# Patient Record
Sex: Male | Born: 1935 | Race: White | Hispanic: No | Smoking: Former smoker
Health system: Southern US, Community
[De-identification: ages and names within clinical notes are randomized; demographics above are authoritative.]

## PROBLEM LIST (undated history)

## (undated) DIAGNOSIS — M1990 Unspecified osteoarthritis, unspecified site: Secondary | ICD-10-CM

## (undated) DIAGNOSIS — J309 Allergic rhinitis, unspecified: Secondary | ICD-10-CM

## (undated) DIAGNOSIS — E785 Hyperlipidemia, unspecified: Secondary | ICD-10-CM

## (undated) DIAGNOSIS — I714 Abdominal aortic aneurysm, without rupture, unspecified: Secondary | ICD-10-CM

## (undated) DIAGNOSIS — N182 Chronic kidney disease, stage 2 (mild): Secondary | ICD-10-CM

## (undated) DIAGNOSIS — E119 Type 2 diabetes mellitus without complications: Secondary | ICD-10-CM

## (undated) DIAGNOSIS — I6522 Occlusion and stenosis of left carotid artery: Secondary | ICD-10-CM

## (undated) DIAGNOSIS — I10 Essential (primary) hypertension: Secondary | ICD-10-CM

## (undated) DIAGNOSIS — N529 Male erectile dysfunction, unspecified: Secondary | ICD-10-CM

## (undated) DIAGNOSIS — R413 Other amnesia: Secondary | ICD-10-CM

## (undated) DIAGNOSIS — K219 Gastro-esophageal reflux disease without esophagitis: Secondary | ICD-10-CM

## (undated) DIAGNOSIS — Z720 Tobacco use: Secondary | ICD-10-CM

## (undated) DIAGNOSIS — I2510 Atherosclerotic heart disease of native coronary artery without angina pectoris: Secondary | ICD-10-CM

## (undated) DIAGNOSIS — I350 Nonrheumatic aortic (valve) stenosis: Secondary | ICD-10-CM

## (undated) DIAGNOSIS — I499 Cardiac arrhythmia, unspecified: Secondary | ICD-10-CM

## (undated) DIAGNOSIS — IMO0001 Reserved for inherently not codable concepts without codable children: Secondary | ICD-10-CM

## (undated) HISTORY — DX: Male erectile dysfunction, unspecified: N52.9

## (undated) HISTORY — DX: Allergic rhinitis, unspecified: J30.9

## (undated) HISTORY — DX: Other amnesia: R41.3

## (undated) HISTORY — DX: Type 2 diabetes mellitus without complications: E11.9

## (undated) HISTORY — DX: Gastro-esophageal reflux disease without esophagitis: K21.9

## (undated) HISTORY — PX: ELBOW SURGERY: SHX618

## (undated) HISTORY — DX: Hyperlipidemia, unspecified: E78.5

## (undated) HISTORY — DX: Cardiac arrhythmia, unspecified: I49.9

## (undated) HISTORY — DX: Reserved for inherently not codable concepts without codable children: IMO0001

## (undated) HISTORY — DX: Chronic kidney disease, stage 2 (mild): N18.2

## (undated) HISTORY — PX: CORONARY ARTERY BYPASS GRAFT: SHX141

## (undated) HISTORY — DX: Tobacco use: Z72.0

## (undated) HISTORY — DX: Nonrheumatic aortic (valve) stenosis: I35.0

## (undated) HISTORY — PX: APPENDECTOMY: SHX54

## (undated) HISTORY — DX: Occlusion and stenosis of left carotid artery: I65.22

## (undated) HISTORY — DX: Unspecified osteoarthritis, unspecified site: M19.90

## (undated) HISTORY — DX: Essential (primary) hypertension: I10

## (undated) HISTORY — DX: Atherosclerotic heart disease of native coronary artery without angina pectoris: I25.10

---

## 1998-10-26 ENCOUNTER — Ambulatory Visit (HOSPITAL_COMMUNITY): Admission: RE | Admit: 1998-10-26 | Discharge: 1998-10-26 | Payer: Self-pay | Admitting: Gastroenterology

## 1998-10-26 ENCOUNTER — Encounter: Payer: Self-pay | Admitting: Gastroenterology

## 2004-01-08 ENCOUNTER — Inpatient Hospital Stay (HOSPITAL_COMMUNITY): Admission: AD | Admit: 2004-01-08 | Discharge: 2004-01-18 | Payer: Self-pay | Admitting: Emergency Medicine

## 2004-02-15 ENCOUNTER — Encounter (HOSPITAL_COMMUNITY): Admission: RE | Admit: 2004-02-15 | Discharge: 2004-05-15 | Payer: Self-pay | Admitting: Interventional Cardiology

## 2004-05-16 ENCOUNTER — Encounter (HOSPITAL_COMMUNITY): Admission: RE | Admit: 2004-05-16 | Discharge: 2004-06-06 | Payer: Self-pay | Admitting: Interventional Cardiology

## 2004-08-04 ENCOUNTER — Ambulatory Visit (HOSPITAL_COMMUNITY): Admission: RE | Admit: 2004-08-04 | Discharge: 2004-08-04 | Payer: Self-pay | Admitting: Gastroenterology

## 2004-08-17 ENCOUNTER — Ambulatory Visit (HOSPITAL_COMMUNITY): Admission: RE | Admit: 2004-08-17 | Discharge: 2004-08-17 | Payer: Self-pay | Admitting: Gastroenterology

## 2004-12-22 ENCOUNTER — Emergency Department (HOSPITAL_COMMUNITY): Admission: EM | Admit: 2004-12-22 | Discharge: 2004-12-23 | Payer: Self-pay | Admitting: Emergency Medicine

## 2005-04-20 ENCOUNTER — Ambulatory Visit (HOSPITAL_COMMUNITY): Admission: RE | Admit: 2005-04-20 | Discharge: 2005-04-20 | Payer: Self-pay | Admitting: Interventional Cardiology

## 2005-09-20 ENCOUNTER — Emergency Department (HOSPITAL_COMMUNITY): Admission: EM | Admit: 2005-09-20 | Discharge: 2005-09-20 | Payer: Self-pay | Admitting: *Deleted

## 2007-11-06 ENCOUNTER — Inpatient Hospital Stay (HOSPITAL_COMMUNITY): Admission: EM | Admit: 2007-11-06 | Discharge: 2007-11-07 | Payer: Self-pay | Admitting: Emergency Medicine

## 2008-04-17 ENCOUNTER — Ambulatory Visit: Payer: Self-pay | Admitting: Gastroenterology

## 2008-04-30 ENCOUNTER — Ambulatory Visit: Payer: Self-pay | Admitting: Gastroenterology

## 2010-02-16 ENCOUNTER — Ambulatory Visit (HOSPITAL_COMMUNITY): Admission: RE | Admit: 2010-02-16 | Discharge: 2010-02-17 | Payer: Self-pay | Admitting: Neurosurgery

## 2010-03-15 ENCOUNTER — Encounter (INDEPENDENT_AMBULATORY_CARE_PROVIDER_SITE_OTHER): Payer: Self-pay | Admitting: Neurosurgery

## 2010-03-15 ENCOUNTER — Inpatient Hospital Stay (HOSPITAL_COMMUNITY): Admission: RE | Admit: 2010-03-15 | Discharge: 2010-03-16 | Payer: Self-pay | Admitting: Neurosurgery

## 2010-11-16 ENCOUNTER — Encounter: Admission: RE | Admit: 2010-11-16 | Discharge: 2010-11-16 | Payer: Self-pay | Admitting: Orthopedic Surgery

## 2011-02-17 ENCOUNTER — Other Ambulatory Visit: Payer: Self-pay | Admitting: Dermatology

## 2011-03-12 LAB — CBC
HCT: 43.8 % (ref 39.0–52.0)
Hemoglobin: 15 g/dL (ref 13.0–17.0)
Hemoglobin: 15.1 g/dL (ref 13.0–17.0)
MCHC: 34.3 g/dL (ref 30.0–36.0)
MCHC: 34.6 g/dL (ref 30.0–36.0)
MCV: 94 fL (ref 78.0–100.0)
MCV: 94.5 fL (ref 78.0–100.0)
RBC: 4.64 MIL/uL (ref 4.22–5.81)
RDW: 12.9 % (ref 11.5–15.5)
RDW: 13 % (ref 11.5–15.5)

## 2011-03-12 LAB — SURGICAL PCR SCREEN: Staphylococcus aureus: NEGATIVE

## 2011-03-12 LAB — BASIC METABOLIC PANEL
BUN: 9 mg/dL (ref 6–23)
CO2: 29 mEq/L (ref 19–32)
CO2: 31 mEq/L (ref 19–32)
Chloride: 103 mEq/L (ref 96–112)
Chloride: 104 mEq/L (ref 96–112)
Creatinine, Ser: 1.03 mg/dL (ref 0.4–1.5)
GFR calc Af Amer: >60 mL/min (ref >60)
GFR calc non Af Amer: >60 mL/min (ref >60)
Glucose, Bld: 114 mg/dL — ABNORMAL HIGH (ref 70–99)
Glucose, Bld: 175 mg/dL — ABNORMAL HIGH (ref 70–99)
Potassium: 4.1 mEq/L (ref 3.5–5.1)
Sodium: 137 mEq/L (ref 135–145)

## 2011-05-05 NOTE — Discharge Summary (Signed)
NAME:  Cory Bradley, AULL NO.:  [id number]   MEDICAL RECORD NO.:  [id number]                   PATIENT TYPE:  INP   LOCATION:  2011                                 FACILITY:  [facility]   PHYSICIAN:  Kerin Perna, M.D.               DATE OF BIRTH:  1933/09/22   DATE OF ADMISSION:  01/07/2004  DATE OF DISCHARGE:  01/17/2004                                 DISCHARGE SUMMARY   ADMIT DIAGNOSES:  Chest pain and acute myocardial infarction.   PAST MEDICAL HISTORY:  1. Gastroesophageal reflux disease with esophageal stricture.  2. Migraines.  3. Hyperlipidemia.  4. Hypertension.   ALLERGIES:  PENICILLIN which causes edema.   DISCHARGE DIAGNOSES:  Three-vessel coronary artery disease, status post  acute inferior myocardial infarction, status post coronary artery bypass  grafting x 4.   BRIEF HISTORY:  This is a 77 year old male, who has no prior history of  coronary artery disease; however, one week prior to admission, the patient  had a 20 minute episode of chest pain following administration of Viagra.  The patient denied any further symptoms until the day prior to admission  when he took Viagra again and approximately 2 hours later, began to  experience chest pain.  The patient presented to the emergency department at  [facility].  An EKG that was performed was consistent with  myocardial infarction.  There was one 2 mm ST elevation in leads II, III,  and aVF.  The patient was complaining of nausea and diaphoresis for  approximately 30 minutes.  The patient was evaluated in the emergency  department by Dr. Katrinka Blazing, who then performed an urgent cardiac cath on  January 08, 2004.  Cardiac cath revealed three-vessel coronary artery  disease.  The patient was admitted to [facility], and Dr. Kathlee Nations  Trigt, of CVTS was consulted regarding surgical revascularization.  After  evaluation of the patient, it was Dr. Zenaida Niece Trigt's opinion that the  patient  should undergo coronary artery bypass grafting.   HOSPITAL COURSE:  The patient presented to the emergency department at [facility] and was admitted on January 08, 2004.  The patient was  evaluated by Dr. Verdis Prime in the emergency department and was diagnosed  with an acute inferior myocardial infarction.  The patient was taken for an  urgent cardiac cath by Dr. Katrinka Blazing which revealed three-vessel coronary artery  disease and an ejection fraction of 50%.  The acute myocardial infarction  related to an acute occlusion of the right coronary artery, there was  successful placement of a PTCA stent in the right coronary artery by Dr.  Katrinka Blazing.  The patient was started on IV Integrilin and aspirin.  Dr. Donata Clay  was consulted, and the patient was scheduled for coronary artery bypass  grafting surgery.  The patient remained free of pain and denied shortness of  breath prior to  surgery.  The patient was noted to have elevated blood  sugars, and therefore a hemoglobin A1c was obtained.  Hemoglobin A1c was 6.3  and therefore, the patient was diagnosed with diabetes mellitus.  The  patient was then started on sliding-scale insulin.  The patient was taken to  the OR on January 13, 2004, for coronary artery bypass grafting x 4.  Left  internal mammary artery was grafted to the LAD, saphenous vein was grafted  to the circumflex, and saphenous vein was grafted to the diagonal and  saphenous vein graft to the PD.  Endoscopic vein harvest was performed on  the left thigh.  The patient tolerated the procedure well and was stable  immediately postoperatively.  The patient was extubated without problem and  woke up from anesthesia neurologically intact.  The patient's postoperative  course progressed as expected.  Chest tubes and invasive labs were  discontinued on postoperative day one without complication.  The patient was  then transferred from the SICU to unit 2000 in stable condition.   The  patient began cardiac rehab on px day one and tolerated this well.  Postoperative day two, the patient was without complaint.  He was afebrile,  and vital signs were stable.  His blood pressure and heart rate were noted  to be mildly elevated at 150/84 and a heart rate of 105.  The patient's O2  saturation was 94% on 2 liters, and he was in normal sinus rhythm.   PHYSICAL EXAMINATION:  CARDIAC:  Slightly tachycardic.  LUNGS:  Faint crackles at bilateral bases.  ABDOMEN:  Soft, nontender, and nondistended.  There were positive bowel  sounds and positive flatus.  However, the patient had not had a bowel  movement since surgery.  The incisions were clean, dry, and intact.  EXTREMITIES:  The extremities revealed no edema.   The patient's beta blocker was increased for treatment of the elevated blood  pressure and heart rate.  The oxygen was weaned, and the patient was given a  laxative for constipation.  The patient was felt to be in stable condition  at this time and as long as there are no complications, the patient will be  ready for discharge within the next 1-2 days.  The diabetes coordinator was  requested to come and evaluate the patient on postoperative day three for  recommendations of outpatient diabetes medications and care.   LABORATORY DATA:  CBC on January 15, 2004:  White count 11.4, hemoglobin  10.1, hematocrit 28.6, platelets 215.  BNP on January 15, 2004:  Sodium 136,  potassium 3.7, BUN 21, creatinine 1.1, glucose 135.   CONDITION ON DISCHARGE:  Improved.   INSTRUCTIONS:   MEDICATIONS:  1. The patient is to resume Aciphex 20 mg p.o. daily.  2. New medications include aspirin 325 mg 1p.o. daily.  3. Toprol XL 50 mg 1 p.o. daily.  4. Lipitor 20 mg 1 p.o. daily.  5. Colace 100 mg 2 tabs p.o. daily.  6. Tylox 1-2 p.o. q.4-6h. p.r.n. pain.   ACTIVITY:  No driving, no strenuous activity, and the patient is to continue daily breathing and walking exercises.    DIET:  Low salt, fat, and cholesterol.   WOUND CARE:  1. Shower daily and clean the incisions with soap and water.  2. If the incisions are red, swollen, drain, or if the patient has a fever     of 101 degrees F, he is to call the CVTS office at [phone].  FOLLOW-UP APPOINTMENT:  1. With Dr. Donata Clay on January 29, 2004, at 2 p.m.  2. Dr. Katrinka Blazing within 2 weeks after discharge.  The patient is to call his     office for an appointment.  3. The patient is to see his primary care physician for follow-up of newly     diagnosed diabetes mellitus.      Pecola Leisure, [place]                      Kerin Perna, M.D.    AY/MEDQ  D:  01/15/2004  T:  01/16/2004  Job:  [id number]   cc:   Mikey Bussing, M.D.  [address]  [place]  [place] [address]   Lyn Records III, M.D.  [address]  [address]  [place]  [place] [address]  Fax: [phone]

## 2011-05-05 NOTE — Cardiovascular Report (Signed)
NAME:  TYJON, BOWEN NO.:  [id number]   MEDICAL RECORD NO.:  [id number]                   PATIENT TYPE:  INP   LOCATION:  2929                                 FACILITY:  [facility]   PHYSICIAN:  Lesleigh Noe, M.D.            DATE OF BIRTH:  [birth year]   DATE OF PROCEDURE:  01/08/2004  DATE OF DISCHARGE:                              CARDIAC CATHETERIZATION   INDICATIONS FOR PROCEDURE:  Acute inferior myocardial infarction.   DATE OF PROCEDURE:  January 08, 2004.   PROCEDURE PERFORMED:  1. Left heart catheterization.  2. Selective coronary angiography.  3. Left ventriculography.  4. Percutaneous transluminal coronary angioplasty of right coronary.   DESCRIPTION:  After informed consent, a 6-French sheath was placed in the  right femoral artery using modified Seldinger technique.  A 6-French A2  multipurpose catheter was used for hemodynamic recordings, left  ventriculography by hand injection, and selective left and right  coronary  angiography.  The patient came to the cath lab on IV fluids.  Had received a  bolus of heparin and also received aspirin. The emergency physician started  nitroglycerin.  However, I stopped this because the patient had recently  used Viagra within the past three hours.   After review of the scintiangiograms, it was felt that angioplasty of the  right coronary was necessary to minimize inferior wall damage, but we  decided against stenting the right coronary because the patient had  multivessel disease and would need coronary bypass surgery.   We used a 6 [place] side hole #4 Judkins catheter to obtain guiding shots of  the right coronary and Asahi medium wire to obtain access across the total  occlusion in the mid right coronary.  The patient was started on an  integrilin double bolus followed by an infusion before intervention begun.  ACT was documented to be 224 seconds before wire intervention and an  additional  1000 units of IV heparin was administered.  Ultimately, in mid  case the ACT was 272 seconds.  After crossing the lesion with the wire, we  used a 2.5 x 15-mm Maverick balloon for mid vessel and proximal vessel  angioplasty and then upgraded to a 3.0 x 15 balloon for mid and proximal  vessel angioplasty with resolution of total occlusion and resumption of TIMI-  3 flow and significant reduction in chest discomfort.  ST segments resolved  after the wire crossed the lesion in the mid right coronary.  Angio-Seal was  used to close the right femoral artery arteriotomy site.  We had also placed  a venous sheath 6 French in case pacing was necessary, but this never became  an issue.   RESULTS:   I. HEMODYNAMIC DATA:  A.  Aortic pressure 138/82.  B.  Left ventricular pressure 141/27.   II. LEFT VENTRICULOGRAPHY:  The left ventricle demonstrates moderate  inferior wall hypokinesis.  EF 50%.  No MR.  III. CORONARY ANGIOGRAPHY:  A.  Left main coronary:  Patent, but with distal  40-50% narrowing.  B.  Left anterior descending coronary:  The LAD is a large vessel that wraps  around the left ventricular apex starting at its ostium and extending into  the mid vessel.  There is between 60 and 75% narrowing in multiple spots.  There is also what appears to be intimal disruption in the proximal vessel.  The LAD wraps around the left ventricular apex.  The LAD in its mid and  distal portion is graftable.  C.  Ramus branch:  The ramus branch is small to moderate in size and  contains proximal 70-80% narrowing.  D.  Circumflex artery:  There is an ostial 75-85% circumflex narrowing and a  95% mid circumflex narrowing, followed by large obtuse marginal branch.  The  distal circumflex is occluded.  E.  Right coronary:  The right coronary initially revealed a 95% proximal  stenosis and total occlusion in the mid vessel at the acute marginal branch.  There were no collaterals.   IV. PERCUTANEOUS  CORONARY INTERVENTION:  Following angioplasty approximately  in the mid RCA, the proximal stenosis was reduced to 40% and the mid  stenosis reduced to 40% with resumption of TIMI-3 flow.   CONCLUSIONS:  1. Acute inferior infarction due to occlusion of the mid right coronary.  2. Successful percutaneous transluminal coronary angioplasty of the proximal     and mid right coronary from 95 and 100% to 40 and 40% respectively with     TIMI-3 flow.  3. Severe three-vessel coronary disease with residual 60-80% distal right     coronary disease beyond the angioplasty sites, 60-80% proximal mid left     anterior descending and 70 and 95% stenosis in the circumflex.  4. Mild left ventricular dysfunction with inferior hypokinesis.   PLAN:  Will continue IV integrilin.  Will get CVTS consultation and  hopefully have surgery within the next several days.  Will continue beta  blocker therapy.  Will withhold nitrates because of recent use of  sildenafil.                                               Lesleigh Noe, M.D.    HWS/MEDQ  D:  01/08/2004  T:  01/08/2004  Job:  [id number]   cc:   Thelma Barge P. Modesto Charon, M.D.  [address]  [place]  [place] [address]  Fax: [phone]   CVTS

## 2011-05-05 NOTE — Op Note (Signed)
NAME:  Cory Bradley, Cory Bradley NO.:  [id number]   MEDICAL RECORD NO.:  [id number]                   PATIENT TYPE:  INP   LOCATION:  2301                                 FACILITY:  [facility]   PHYSICIAN:  Kerin Perna III, M.D.           DATE OF BIRTH:  Nov 25, 1933   DATE OF PROCEDURE:  01/13/2004  DATE OF DISCHARGE:                                 OPERATIVE REPORT   PREOPERATIVE DIAGNOSIS:  Class 4 postinfarction unstable angina with acute  diaphragmatic myocardial infarction.   POSTOPERATIVE DIAGNOSIS:  Class 4 postinfarction unstable angina with acute  diaphragmatic myocardial infarction.   OPERATION PERFORMED:  Coronary artery bypass grafting times four (left  internal mammary artery to left anterior descending, saphenous vein graft to  first diagonal, saphenous vein graft to circumflex marginal, saphenous vein  graft to posterior descending).   SURGEON:  Kerin Perna, M.D.   ASSISTANT:  Rowe Clack, P.A.-C.   ANESTHESIA:  General.   INDICATIONS FOR PROCEDURE:  The patient is a 77 year old white male who  presented with acute chest pain and an EKG showing an acute evolving DMI.  Emergency cardiac catheterization by Lyn Records, M.D. demonstrated an  occluded right which was opened with angioplasty.  He also had significant  disease of the LAD, diagonal and circumflex.  He was felt to be a candidate  for surgical revascularization after he recovered from his acute MI.  Prior  to surgery I discussed the patient's cardiac catheterization and his  clinical course with the patient and his wife.  I discussed the indications  and expected benefits of coronary artery bypass surgery for treatment of his  three-vessel coronary artery disease.  I discussed the alternatives to  surgical therapy as well.  We have reviewed the major aspects of the  proposed procedure including the choice of conduit for grafts, the location  of the surgical incisions, the use  of general anesthesia and cardiopulmonary  bypass and the expected postoperative hospital recovery.  We discussed the  risks to him associated with heart bypass surgery including the risks of MI,  CVA, bleeding, wound infection, and death.  He understands the implications  for the surgery and agrees to proceed with the operation as planned under  what I feel is an informed consent.   OPERATIVE FINDINGS:  The vein was harvested from the left thigh and left  lower leg using endoscopic technique.  It was of average quality. The  mammary artery was a good vessel with excellent flow.  His vessels were  small but graftable.  There is a 3 cm hemorrhagic infarct on the  posterolateral left ventricular wall.   DESCRIPTION OF PROCEDURE:  The patient was brought to the operating room and  placed supine on the operating table where general anesthesia was induced  under invasive hemodynamic monitoring.  The chest, abdomen and legs were  prepped with Betadine and draped as  a sterile field.  A sternal incision was  made and the vein was harvested from the left leg using endoscopic  technique. The left internal mammary artery was harvested as a pedicle graft  from its origin at the subclavian vessels.  It was an excellent vessel with  excellent flow.  Heparin was administered and the ACT was documented as  being therapeutic.  Sternal retractor was placed and the pericardial cradle  was created.  Through pursestrings placed in the ascending aorta and right  atrium, the patient was cannulated and placed on bypass.  Coronaries were  inspected.  His right coronary had severe diffuse calcified disease.  Distally, it was fairly small and the bypass graft was placed to the distal  right in an area of minimal plaque.  The circumflex marginal was a small  vessel but adequate target for grafting.  The ventricle was inspected and  there was a recent posterolateral MI with edema and hemorrhagic changes in  the  epicardium.   The mammary artery and vein grafts were prepared for the distal anastomoses.  The cardioplegia cannula was placed for antegrade aortic cardioplegia.  The  patient was cooled to 30 degrees.  The aortic cross-clamp was applied and  750 mL of cold blood cardioplegia was delivered to the root with immediate  cardioplegic arrest, septal temperature dropping less than 14 degrees.  Topical iced saline was used to augment myocardial preservation and a  pericardial insulator pad was used to protect the left phrenic nerve.   The distal coronary anastomoses were then performed.  The first distal  anastomosis was to the distal right.  This was a heavily diseased sclerotic  vessel and a site of anastomosis was determined in the area of least severe  disease.  A reversed saphenous vein was sewn end-to-side with running 7-0  Prolene and there was good flow through the graft.  The second distal  anastomosis was to the first diagonal.  This was a 1.5 mm vessel with  proximal 90% stenosis.  A reversed saphenous vein was sewn end-to-side with  running 7-0 Prolene with good flow through the graft.  The third distal  anastomosis was the OM1.  This was an intramyocardial vessel 1.5 mm in  diameter with a proximal 80% stenosis.  A reversed saphenous vein was sewn  end-to-side with running 7-0 Prolene with good flow through the graft.  Cardioplegia was redosed.  The fourth distal anastomosis was to the distal  LAD.  This also had diffuse disease.  The mammary pedicle was brought  through an opening in the pericardium and was brought down onto the LAD and  sewn end-to-side with running 8-0 Prolene. There was excellent flow through  the anastomosis with immediate rise in septal temperature after released of  the pedicle clamp on the mammary artery.  The mammary pedicle was secured to  the epicardium and the aortic cross-clamp was removed.  The heart resumed a spontaneous rhythm.  Three proximal vein  anastomoses  were placed on the ascending aorta using a partial occlusion clamp. The  partial clamp was removed and the vein grafts were perfused. Each had  excellent flow and hemostasis was documented in the proximal and distal  sites.  The patient was rewarmed to 37 degrees.  Temporary pacing wires were  applied.  The patient was weaned from bypass after the ventilator was  resumed.  No inotropes were required.  Blood pressure and cardiac output  were stable.  A left femoral arterial  line was placed for dampening of the  radial artery.  The mediastinum was irrigated with warm antibiotic  irrigation.  The leg incision was irrigated and closed in a standard  fashion.  The pericardium was loosely reapproximated.  Two mediastinal and a  left pleural chest tube were placed and brought out through separate  incisions. The sternum was closed with interrupted steel wire.  The  pectoralis fascia was closed with a running #1 Vicryl.  The subcutaneous and  skin layers were closed with a running Vicryl.  Total bypass time was 130  minutes with a cross-clamp time of 50 minutes.                                               Mikey Bussing, M.D.    PV/MEDQ  D:  01/13/2004  T:  01/14/2004  Job:  [id number]   cc:   Lesleigh Noe, M.D.  [address]  [address]  [place]  [place] [address]  Fax: [phone]

## 2011-09-26 LAB — POCT CARDIAC MARKERS
CKMB, poc: 1.1
Operator id: 285491
Troponin i, poc: <0.05

## 2011-09-26 LAB — LIPID PANEL
LDL Cholesterol: 37
Triglycerides: 49

## 2011-09-26 LAB — CK TOTAL AND CKMB (NOT AT ARMC)
CK, MB: 1.5
CK, MB: 2.1
Relative Index: 1.7
Total CK: 99

## 2011-09-26 LAB — COMPREHENSIVE METABOLIC PANEL
BUN: 12
Calcium: 8.1 — ABNORMAL LOW
Glucose, Bld: 208 — ABNORMAL HIGH
Total Protein: 5.7 — ABNORMAL LOW

## 2011-09-26 LAB — PROTIME-INR: INR: 1.2

## 2011-09-26 LAB — DIFFERENTIAL
Lymphocytes Relative: 8 — ABNORMAL LOW
Lymphs Abs: 0.9
Monocytes Relative: 4
Neutro Abs: 10 — ABNORMAL HIGH
Neutrophils Relative %: 86 — ABNORMAL HIGH

## 2011-09-26 LAB — CBC
HCT: 37.6 — ABNORMAL LOW
Hemoglobin: 13.1
MCHC: 35
RDW: 13.1

## 2011-09-26 LAB — TSH: TSH: 1.028

## 2011-09-26 LAB — AMYLASE: Amylase: 70

## 2012-04-29 ENCOUNTER — Other Ambulatory Visit: Payer: Self-pay | Admitting: Dermatology

## 2012-06-19 ENCOUNTER — Other Ambulatory Visit: Payer: Self-pay | Admitting: Dermatology

## 2012-07-04 ENCOUNTER — Ambulatory Visit: Payer: Medicare Other | Attending: Orthopedic Surgery | Admitting: Rehabilitation

## 2013-04-21 ENCOUNTER — Other Ambulatory Visit: Payer: Self-pay | Admitting: Dermatology

## 2013-09-29 ENCOUNTER — Other Ambulatory Visit: Payer: Self-pay | Admitting: Dermatology

## 2014-04-29 ENCOUNTER — Other Ambulatory Visit: Payer: Self-pay | Admitting: Orthopedic Surgery

## 2014-04-29 DIAGNOSIS — R531 Weakness: Secondary | ICD-10-CM

## 2014-04-29 DIAGNOSIS — R609 Edema, unspecified: Secondary | ICD-10-CM

## 2014-04-29 DIAGNOSIS — M25512 Pain in left shoulder: Secondary | ICD-10-CM

## 2014-05-04 ENCOUNTER — Ambulatory Visit
Admission: RE | Admit: 2014-05-04 | Discharge: 2014-05-04 | Disposition: A | Discharge status: Home / Self Care | Payer: Medicare Other | Source: Ambulatory Visit | Attending: Orthopedic Surgery | Admitting: Orthopedic Surgery

## 2014-05-04 ENCOUNTER — Encounter (INDEPENDENT_AMBULATORY_CARE_PROVIDER_SITE_OTHER): Payer: Self-pay

## 2014-05-04 DIAGNOSIS — R609 Edema, unspecified: Secondary | ICD-10-CM

## 2014-05-04 DIAGNOSIS — M25512 Pain in left shoulder: Secondary | ICD-10-CM

## 2014-05-04 DIAGNOSIS — R531 Weakness: Secondary | ICD-10-CM

## 2014-07-20 ENCOUNTER — Other Ambulatory Visit: Payer: Self-pay | Admitting: Dermatology

## 2014-12-21 ENCOUNTER — Other Ambulatory Visit: Payer: Self-pay | Admitting: Dermatology

## 2015-09-10 ENCOUNTER — Ambulatory Visit (INDEPENDENT_AMBULATORY_CARE_PROVIDER_SITE_OTHER): Payer: Medicare Other | Admitting: Physician Assistant

## 2015-09-10 ENCOUNTER — Encounter: Payer: Self-pay | Admitting: Physician Assistant

## 2015-09-10 VITALS — BP 120/70 | HR 62 | Ht 74.0 in | Wt 180.6 lb

## 2015-09-10 DIAGNOSIS — I350 Nonrheumatic aortic (valve) stenosis: Secondary | ICD-10-CM

## 2015-09-10 DIAGNOSIS — R0989 Other specified symptoms and signs involving the circulatory and respiratory systems: Secondary | ICD-10-CM

## 2015-09-10 DIAGNOSIS — I10 Essential (primary) hypertension: Secondary | ICD-10-CM | POA: Diagnosis not present

## 2015-09-10 DIAGNOSIS — E785 Hyperlipidemia, unspecified: Secondary | ICD-10-CM | POA: Diagnosis not present

## 2015-09-10 DIAGNOSIS — F17210 Nicotine dependence, cigarettes, uncomplicated: Secondary | ICD-10-CM | POA: Insufficient documentation

## 2015-09-10 DIAGNOSIS — I2510 Atherosclerotic heart disease of native coronary artery without angina pectoris: Secondary | ICD-10-CM | POA: Diagnosis not present

## 2015-09-10 DIAGNOSIS — Z720 Tobacco use: Secondary | ICD-10-CM

## 2015-09-10 DIAGNOSIS — N182 Chronic kidney disease, stage 2 (mild): Secondary | ICD-10-CM | POA: Insufficient documentation

## 2015-09-10 NOTE — Progress Notes (Signed)
Cardiology Office Note Date:  09/10/2015  Patient ID:  Cory Bradley Jul 28, 1933, MRN [id number] PCP:  Aura Dials, MD  Cardiologist: Remotely Dr. Katrinka Blazing, has not been seen in several years  Chief Complaint: referred back to cardiology for h/o moderate AS on prior echo  History of Present Illness: Cory Bradley is a 81 y.o. male with history of CAD with acute inferior MI s/p PTCA of RCA followed by CABGx4 in 2005, tobacco, aortic stenosis, DM, mild cognitive impairment, HLD, HTN, vit D deficiency, mild renal insufficiency (CKD stage II) who presents to clinic to re-establish care. He was sent to [place] by PCP for history of moderate aortic stenosis. Labs 08/30/15 showed WBC 9.6, Hgb 14.9, Hct 47.7, plt 219, Tchol 112, trig 101, HDL 46, LDL 46, A1C 5.5, Na 141, K 5, Cl 105, CO2 30, glucose 98, BUN 20, Cr 1.3, LFTs WNL, TSH WNL. Last echo 01/2014: mod LVH, EF 55-60%, severe LAE, mild MAC with mild MR, moderate aortic valve sclerosis with mild-moderate stenosis; the AV appears to have good mobility, trace TR.  He comes in today to re-establish care. His PCP referred him to discuss timing of repeat ultrasound to evaluate aortic stenosis. From a cardiac standpoint the patient says he's felt "great." He walks 40 min daily at 3. without any limitation. No chest pain, SOB, LEE, orthopnea, PND, palpitations or syncope. No hx of TIA, stroke or bleeding issues. He has been under recent surveillance for unintentional weight loss by PCP - has lost about 20lbs without trying.    Past Medical History  Diagnosis Date  . Hypertension   . Memory loss   . Hyperlipidemia   . Coronary artery disease     a. acute inferior MI s/p PTCA of RCA followed by CABGx4 in 2005,  . Osteoarthritis   . Reflux   . ED (erectile dysfunction)   . Allergic rhinitis   . Left carotid stenosis   . Tobacco abuse   . Diabetes mellitus   . CKD (chronic kidney disease), stage II   . Aortic stenosis     a. Mild-mod by echo  01/2014.    Past Surgical History  Procedure Laterality Date  . Coronary artery bypass graft      Current Outpatient Prescriptions  Medication Sig Dispense Refill  . aspirin 81 MG tablet Take 81 mg by mouth daily.    [name] atorvastatin (LIPITOR) 80 MG tablet Take 80 mg by mouth daily.    . Calcium Carbonate-Vitamin D (PX CALCIUM&D) 600-400 MG-UNIT per tablet Take 600 mg by mouth.    . clindamycin (CLEOCIN) 150 MG capsule Take 150 mg by mouth 4 (four) times daily.    . Coenzyme Q10 (CO Q 10) 10 MG CAPS Take 100 mg by mouth.    . cyanocobalamin 1000 MCG tablet Take 100 mcg by mouth daily.    [name] donepezil (ARICEPT) 5 MG tablet Take 5 mg by mouth at bedtime.    . memantine (NAMENDA) 10 MG tablet Take 10 mg by mouth 2 (two) times daily.    . metFORMIN (GLUCOPHAGE) 500 MG tablet TAKE 1 TABLET (500 MG TOTAL) BY MOUTH DAILY.  5  . naproxen sodium (ANAPROX) 550 MG tablet Take 550 mg by mouth 2 (two) times daily as needed for mild pain.     . niacin (NIASPAN) 1000 MG CR tablet Take 1,000 mg by mouth at bedtime.    . Omega-3 Fatty Acids (FISH OIL) 1000 MG CAPS Take 1,000 mg by  mouth 2 (two) times daily.    [name] omeprazole (PRILOSEC) 40 MG capsule Take 40 mg by mouth daily.    . propranolol ER (INDERAL LA) 120 MG 24 hr capsule Take 120 mg by mouth daily.    . ramipril (ALTACE) 10 MG capsule Take 10 mg by mouth daily.    . sildenafil (VIAGRA) 100 MG tablet Take 100 mg by mouth daily as needed for erectile dysfunction.     No current facility-administered medications for this visit.    Allergies:   Penicillins and Sulfa antibiotics   Social History:  The patient  reports that he has never smoked. He does not have any smokeless tobacco history on file.   Family History:  The patient's family history includes Heart disease in his father and mother. ROS:  Please see the history of present illness. All other systems are reviewed and otherwise negative.   PHYSICAL EXAM:  VS:  BP 120/70 mmHg  Pulse 62  Ht   (1.88 m)  Wt 180 lb 9.6 oz (81.92 kg)  BMI 23.18 kg/m2  SpO2 97% BMI: Body mass index is 23.18 kg/(m^2). Well nourished, well developed WM, in no acute distress HEENT: normocephalic, atraumatic Neck: no JVD. +bilateral carotid bruits. No masses Cardiac:  RRR; 2/6 SEM at RUSB - preserved but quieter S2. No rubs or gallops Lungs:  clear to auscultation bilaterally, no wheezing, rhonchi or rales Abd: soft, nontender, no hepatomegaly, + BS MS: no deformity or atrophy Ext: no edema Skin: warm and dry, no rash Neuro:  moves all extremities spontaneously, no focal abnormalities noted, follows commands Psych: euthymic mood, full affect   EKG:  Done today shows NSR 62bpm, prior inferior infarct, nonspecific changes  Recent Labs: See above  Wt Readings from Last 3 Encounters:  09/10/15 180 lb 9.6 oz (81.92 kg)     Other studies reviewed: Additional studies/records reviewed today include: summarized above  ASSESSMENT AND PLAN:   The patient was seen and examined by myself and Dr. Myrtis Ser since he was a new patient.  1. Aortic stenosis - although asymptomatic he certainly has an impressive murmur on exam. We will obtain repeat echocardiogram. This will need to be followed periodically. Discussed observation for symptoms with patient. 2. CAD s/p remote MI/CABG as above - no recent angina. He reports he had a stress test about 4 years ago that was fine. He is on ASA, BB, statin. Continue current regimen. 3. Essential HTN - remains controlled. 4. Hyperlipidemia - continue statin. Lipids followed by PCP. 5. H/o tobacco abuse - quit at time of MI. Congratulated him on continued abstinence. 6. Possible history of carotid disease - this was listed in his chart here in Epic. He denies prior carotid dopplers. Carotid bruits may be r/t aortic stenosis but given history of CAD will order carotid duplex.  Disposition: F/u with Dr. Katrinka Blazing in 6 months. Instructed him to f/u PCP for ongoing  investigation of unintentional weight loss.  Current medicines are reviewed at length with the patient today.  The patient did not have any concerns regarding medicines.  Cory Mohair PA-C 09/10/2015 3:26 PM     [facility] [address] [place] [address] [phone] (office)  [phone] (fax)

## 2015-09-10 NOTE — Patient Instructions (Addendum)
Medication Instructions:  Your physician recommends that you continue on your current medications as directed. Please refer to the Current Medication list given to you today.   Labwork: None ordered  Testing/Procedures: Your physician has requested that you have an echocardiogram. Echocardiography is a painless test that uses sound waves to create images of your heart. It provides your doctor with information about the size and shape of your heart and how well your heart's chambers and valves are working. This procedure takes approximately one hour. There are no restrictions for this procedure.  Your physician has requested that you have a carotid duplex. This test is an ultrasound of the carotid arteries in your neck. It looks at blood flow through these arteries that supply the brain with blood. Allow one hour for this exam. There are no restrictions or special instructions.    Follow-Up: Your physician wants you to follow-up in: 6 MONTHS WITH DR. Katrinka Blazing.  You will receive a reminder letter in the mail two months in advance. If you don't receive a letter, please call our office to schedule the follow-up appointment.    Echocardiogram An echocardiogram, or echocardiography, uses sound waves (ultrasound) to produce an image of your heart. The echocardiogram is simple, painless, obtained within a short period of time, and offers valuable information to your health care provider. The images from an echocardiogram can provide information such as:  Evidence of coronary artery disease (CAD).  Heart size.  Heart muscle function.  Heart valve function.  Aneurysm detection.  Evidence of a past heart attack.  Fluid buildup around the heart.  Heart muscle thickening.  Assess heart valve function. LET [facility] CARE PROVIDER KNOW ABOUT:  Any allergies you have.  All medicines you are taking, including vitamins, herbs, eye drops, creams, and over-the-counter medicines.  Previous  problems you or members of your family have had with the use of anesthetics.  Any blood disorders you have.  Previous surgeries you have had.  Medical conditions you have.  Possibility of pregnancy, if this applies. BEFORE THE PROCEDURE  No special preparation is needed. Eat and drink normally.  PROCEDURE   In order to produce an image of your heart, gel will be applied to your chest and a wand-like tool (transducer) will be moved over your chest. The gel will help transmit the sound waves from the transducer. The sound waves will harmlessly bounce off your heart to allow the heart images to be captured in real-time motion. These images will then be recorded.  You may need an IV to receive a medicine that improves the quality of the pictures. AFTER THE PROCEDURE You may return to your normal schedule including diet, activities, and medicines, unless your health care provider tells you otherwise. Document Released: 12/01/2000 Document Revised: 04/20/2014 Document Reviewed: 08/11/2013 [facility] Patient Information 2015 [place], [place]. This information is not intended to replace advice given to you by your health care provider. Make sure you discuss any questions you have with your health care provider.

## 2015-09-22 ENCOUNTER — Encounter (HOSPITAL_COMMUNITY): Payer: Medicare Other

## 2015-09-22 ENCOUNTER — Other Ambulatory Visit (HOSPITAL_COMMUNITY): Payer: Medicare Other

## 2015-09-29 ENCOUNTER — Ambulatory Visit (HOSPITAL_BASED_OUTPATIENT_CLINIC_OR_DEPARTMENT_OTHER): Payer: Medicare Other

## 2015-09-29 ENCOUNTER — Other Ambulatory Visit: Payer: Self-pay

## 2015-09-29 ENCOUNTER — Ambulatory Visit (HOSPITAL_COMMUNITY)
Admission: RE | Admit: 2015-09-29 | Discharge: 2015-09-29 | Disposition: A | Discharge status: Home / Self Care | Payer: Medicare Other | Source: Ambulatory Visit | Attending: Cardiovascular Disease | Admitting: Cardiovascular Disease

## 2015-09-29 DIAGNOSIS — I6523 Occlusion and stenosis of bilateral carotid arteries: Secondary | ICD-10-CM | POA: Insufficient documentation

## 2015-09-29 DIAGNOSIS — I2510 Atherosclerotic heart disease of native coronary artery without angina pectoris: Secondary | ICD-10-CM

## 2015-09-29 DIAGNOSIS — N182 Chronic kidney disease, stage 2 (mild): Secondary | ICD-10-CM | POA: Insufficient documentation

## 2015-09-29 DIAGNOSIS — I517 Cardiomegaly: Secondary | ICD-10-CM | POA: Insufficient documentation

## 2015-09-29 DIAGNOSIS — E119 Type 2 diabetes mellitus without complications: Secondary | ICD-10-CM | POA: Diagnosis not present

## 2015-09-29 DIAGNOSIS — I129 Hypertensive chronic kidney disease with stage 1 through stage 4 chronic kidney disease, or unspecified chronic kidney disease: Secondary | ICD-10-CM | POA: Insufficient documentation

## 2015-09-29 DIAGNOSIS — E785 Hyperlipidemia, unspecified: Secondary | ICD-10-CM | POA: Diagnosis not present

## 2015-09-29 DIAGNOSIS — I350 Nonrheumatic aortic (valve) stenosis: Secondary | ICD-10-CM

## 2015-09-30 ENCOUNTER — Telehealth: Payer: Self-pay | Admitting: Physician Assistant

## 2015-09-30 NOTE — Telephone Encounter (Signed)
-----   Message from Janetta HoraKathryn R Thompson, PA-C sent at 09/30/2015  6:54 AM EDT ----- Aortic stenosis still rated as moderate. We will continue to follow this in the office. Otherwise ECHO looks good. EF 60-65%, mild left atrial dilation and mod right atrial dilation. No changes in plans

## 2015-09-30 NOTE — Telephone Encounter (Signed)
Pt returned my call regarding his ECHO and US Carotid results.  Pt was advised that his Carotid showed that Heterogeneous plaque, bilaterally. He has 1-39% RICA stenosis and 60-79% LICA stenosis which explains the bruit heard on the left side. Also >50% bilateral ECA stenosis. Normal subclavian arteries, bilaterally. Patent vertebral arteries with antegrade flow. He will require repeat carotid dopplers in 6 months ( so in 03/2016) which pt advised that he is due to see Dr. Katrinka BlazingSmith in March, and he will ask him to get it scheduled at that time.  His ECHO showed that his Aortic Stenosis was still moderate and this will continued to be monitored in the office, otherwise, it was normal. Pt verbalized understanding and appreciation.

## 2015-09-30 NOTE — Telephone Encounter (Signed)
New message ° ° °Patient returning call back to nurse.  °

## 2016-03-08 ENCOUNTER — Encounter: Payer: Self-pay | Admitting: Physician Assistant

## 2016-03-08 ENCOUNTER — Ambulatory Visit (INDEPENDENT_AMBULATORY_CARE_PROVIDER_SITE_OTHER): Payer: Medicare Other | Admitting: Physician Assistant

## 2016-03-08 VITALS — BP 132/80 | HR 60 | Ht 74.0 in | Wt 181.0 lb

## 2016-03-08 DIAGNOSIS — I6522 Occlusion and stenosis of left carotid artery: Secondary | ICD-10-CM | POA: Diagnosis not present

## 2016-03-08 DIAGNOSIS — I25810 Atherosclerosis of coronary artery bypass graft(s) without angina pectoris: Secondary | ICD-10-CM

## 2016-03-08 DIAGNOSIS — I350 Nonrheumatic aortic (valve) stenosis: Secondary | ICD-10-CM

## 2016-03-08 NOTE — Progress Notes (Signed)
Cardiology Office Note   Date:  03/08/2016   ID:  [name], DOB 1933-08-04, MRN [id number]  PCP:  Aura Dials, MD  Cardiologist:  Remotely seen by Dr. Katrinka Blazing many years ago, seen by Dr. Myrtis Ser in 2016.     Chief Complaint  Patient presents with  . Follow-up    seen for Dr. Katrinka Blazing  . Aortic Stenosis      History of Present Illness: Cory Bradley is a 82 y.o. male who presents for office follow-up. He has a history of CAD s/p acute inferior MI with PTCA of RCA followed by CABG 4 in 2005, history of tobacco abuse, aortic stenosis, DM, HLD, HTN, stage II CKD. He was last seen in the office on 09/10/2015 to reestablish care with cardiology service since he has not seen Dr. Katrinka Blazing for many years. Previous echocardiogram in February 2015 showed EF 55-60%, moderate LVH, severe left atrial enlargement, mild Mack with mild MR, mild-to-moderate aortic stenosis. Given loud murmur heard on physical exam, echocardiogram was repeated on 09/29/2015 which showed EF 60-65%, no regional wall motion abnormality, grade 1 diastolic dysfunction, moderately to severely calcified aortic annulus with moderate stenosis. Carotid ultrasound obtained on the same day showed only mild right ICA stenosis, moderate 60-79% left ICA stenosis. The plan is to repeat a carotid Doppler in 6 month.  He presents today for 6 month follow-up. Despite his advanced age, he stays very active, in the last few weeks, he has been working remodeling his house including repair the roof and digging a ditch. He does not have any significant dizziness, presyncope/syncope, chest discomfort or shortness of breath. He does not have any significant heart failure symptoms.     Past Medical History  Diagnosis Date  . Hypertension   . Memory loss   . Hyperlipidemia   . Coronary artery disease     a. acute inferior MI s/p PTCA of RCA followed by CABGx4 in 2005,  . Osteoarthritis   . Reflux   . ED (erectile dysfunction)   . Allergic  rhinitis   . Left carotid stenosis   . Tobacco abuse   . Diabetes mellitus (HCC)   . CKD (chronic kidney disease), stage II   . Aortic stenosis     a. Mild-mod by echo 01/2014.    Past Surgical History  Procedure Laterality Date  . Coronary artery bypass graft       Current Outpatient Prescriptions  Medication Sig Dispense Refill  . aspirin 81 MG tablet Take 81 mg by mouth daily.    [name] atorvastatin (LIPITOR) 80 MG tablet Take 80 mg by mouth daily.    . Calcium Carbonate-Vitamin D (PX CALCIUM&D) 600-400 MG-UNIT per tablet Take 600 mg by mouth.    . clindamycin (CLEOCIN) 150 MG capsule Take 150 mg by mouth 4 (four) times daily.    . Coenzyme Q10 (CO Q 10) 10 MG CAPS Take 100 mg by mouth.    . cyanocobalamin 1000 MCG tablet Take 100 mcg by mouth daily.    [name] donepezil (ARICEPT) 5 MG tablet Take 5 mg by mouth at bedtime.    . memantine (NAMENDA) 10 MG tablet Take 10 mg by mouth 2 (two) times daily.    . metFORMIN (GLUCOPHAGE) 500 MG tablet TAKE 1 TABLET (500 MG TOTAL) BY MOUTH DAILY.  5  . naproxen sodium (ANAPROX) 550 MG tablet Take 550 mg by mouth 2 (two) times daily as needed for mild pain.     [name]  niacin (NIASPAN) 1000 MG CR tablet Take 1,000 mg by mouth at bedtime.    . Omega-3 Fatty Acids (FISH OIL) 1000 MG CAPS Take 1,000 mg by mouth 2 (two) times daily.    [name] omeprazole (PRILOSEC) 40 MG capsule Take 40 mg by mouth daily.    . propranolol ER (INDERAL LA) 120 MG 24 hr capsule Take 120 mg by mouth daily.    . ramipril (ALTACE) 10 MG capsule Take 10 mg by mouth daily.    . sildenafil (VIAGRA) 100 MG tablet Take 100 mg by mouth daily as needed for erectile dysfunction.     No current facility-administered medications for this visit.    Allergies:   Penicillins and Sulfa antibiotics    Social History:  The patient  reports that he has been smoking.  He started smoking about 6 months ago. He does not have any smokeless tobacco history on file.   Family History:  The patient's family  history includes Heart disease in his father and mother.    ROS:  Please see the history of present illness.   Otherwise, review of systems are positive for none.   All other systems are reviewed and negative.    PHYSICAL EXAM: VS:  BP 132/80 mmHg  Pulse 60  Ht  (1.88 m)  Wt 181 lb (82.101 kg)  BMI 23.23 kg/m2 , BMI Body mass index is 23.23 kg/(m^2). GEN: Well nourished, well developed, in no acute distress HEENT: normal Neck: no JVD, carotid bruits, or masses Cardiac: RRR; no rubs, or gallops,no edema  3/6 systolic murmur Respiratory:  clear to auscultation bilaterally, normal work of breathing GI: soft, nontender, nondistended, + BS MS: no deformity or atrophy Skin: warm and dry, no rash Neuro:  Strength and sensation are intact Psych: euthymic mood, full affect   EKG:  EKG is ordered today. The ekg ordered today demonstrates NSR without significant ST-T wave changes   Recent Labs: No results found for requested labs within last 365 days.    Lipid Panel    Component Value Date/Time   CHOL  11/07/2007 0305    88        ATP III CLASSIFICATION:  <200     mg/dL   Desirable  161-096  mg/dL   Borderline High  >=045    mg/dL   High   TRIG 49 40/98/1191 0305   HDL 41 11/07/2007 0305   CHOLHDL 2.1 11/07/2007 0305   VLDL 10 11/07/2007 0305   LDLCALC  11/07/2007 0305    37        Total Cholesterol/HDL:CHD Risk Coronary Heart Disease Risk Table                     Men   Women  1/2 Average Risk   3.4   3.3      Wt Readings from Last 3 Encounters:  03/08/16 181 lb (82.101 kg)  09/10/15 180 lb 9.6 oz (81.92 kg)      Other studies Reviewed: Additional studies/ records that were reviewed today include:   Echo 09/29/2015 LV EF: 60% - 65%  ------------------------------------------------------------------- Indications: Aortic Stenosis (I35.0).  ------------------------------------------------------------------- History: PMH: Coronary artery  disease. PMH: Myocardial infarction. Risk factors: Hypertension. Diabetes mellitus. Dyslipidemia.  ------------------------------------------------------------------- Study Conclusions  - Left ventricle: The cavity size was normal. Wall thickness was  increased in a pattern of mild LVH. Systolic function was normal.  The estimated ejection fraction was in the range of 60% to 65%.  Wall motion was normal; there were no regional wall motion  abnormalities. Doppler parameters are consistent with abnormal  left ventricular relaxation (grade 1 diastolic dysfunction). - Aortic valve: Moderately to severely calcified annulus.  Moderately thickened, moderately calcified leaflets. There was  moderate stenosis. Valve area (VTI): 2 cm^2. Valve area (Vmax):  1.39 cm^2. Valve area (Vmean): 1.42 cm^2. - Left atrium: The atrium was mildly dilated. - Right atrium: The atrium was moderately dilated.    Carotid U/S 09/29/2015 He has 1-39% RICA stenosis and 60-79% LICA stenosis which explains the bruit heard on the left side. Also >50% bilateral ECA stenosis. Normal subclavian arteries, bilaterally.    Review of the above records demonstrates:   Moderate AS unchanged on recent echo, also has moderate carotid stenosis     ASSESSMENT AND PLAN:  1.  Moderate AS, asymptomatic  - discussed symptom of severe AS including exertional chest pain, SOB, dizziness, presyncope, he has none of them. He is fairly active and denies any symptom  - will have him followup in 6-9 month at which time the echo would be repeated to reassess the degree of stenosis  2. Moderate L carotid artery stenosis: this is likely should be followed by vascular surgery as outpatient. Will holding on repeat carotid U/S and refer him to vascular surgery  3. CAD s/p acute inferior MI with PTCA of RCA followed by CABG 4 in 2005: no angina  4. DM II: on metformin 5. HLD: on lipitor 6. HTN: well controlled on  ramipril and propranolol 7. stage II CKD   Current medicines are reviewed at length with the patient today.  The patient does not have concerns regarding medicines.  The following changes have been made:  no change  Labs/ tests ordered today include:   Orders Placed This Encounter  Procedures  . Ambulatory referral to Vascular Surgery  . EKG 12-Lead     Disposition:   FU with Dr. Katrinka BlazingSmith in 6-9 months  Signed, Azalee CourseMeng, Maleki Hippe, [place]  03/08/2016 11:34 AM    [facility] [address], [place], [place]  [address] Phone: [phone]; Fax: [phone]

## 2016-03-08 NOTE — Patient Instructions (Addendum)
Your physician wants you to follow-up in: 6-9 Months with Dr Marlou Starkssmith You will receive a reminder letter in the mail two months in advance. If you don't receive a letter, please call our office to schedule the follow-up appointment.  You have been referred to Vascular

## 2016-03-16 ENCOUNTER — Encounter: Payer: Self-pay | Admitting: Gastroenterology

## 2016-04-05 ENCOUNTER — Encounter: Payer: Self-pay | Admitting: Vascular Surgery

## 2016-04-10 ENCOUNTER — Ambulatory Visit: Payer: Medicare Other | Admitting: Interventional Cardiology

## 2016-04-11 ENCOUNTER — Encounter: Payer: Self-pay | Admitting: Vascular Surgery

## 2016-04-11 ENCOUNTER — Ambulatory Visit (INDEPENDENT_AMBULATORY_CARE_PROVIDER_SITE_OTHER): Payer: Medicare Other | Admitting: Vascular Surgery

## 2016-04-11 VITALS — BP 156/85 | HR 57 | Temp 98.7°F | Resp 18 | Ht 74.0 in | Wt 185.0 lb

## 2016-04-11 DIAGNOSIS — I6522 Occlusion and stenosis of left carotid artery: Secondary | ICD-10-CM | POA: Diagnosis not present

## 2016-04-11 NOTE — Progress Notes (Signed)
History of Present Illness:  Patient is a 82 y.o. year old male who presents for evaluation of carotid stenosis.  He was referred by Ronie Spies PA-C CVD.  The patient denies symptoms of TIA, amaurosis, or stroke.  The patient is currently on  antiplatelet therapy in the form of 81 mg aspirin daily.  The carotid stenosis was found due to audible bruit on the left and a duplex carotid was performed.  .  Other medical problems include CAD s/p CABG x 4, hyperlipidemia managed with Lipitor, DM managed with metformin, and hypertension managed with propanolol.    Past Medical History  Diagnosis Date  . Hypertension   . Memory loss   . Hyperlipidemia   . Coronary artery disease     a. acute inferior MI s/p PTCA of RCA followed by CABGx4 in 2005,  . Osteoarthritis   . Reflux   . ED (erectile dysfunction)   . Allergic rhinitis   . Left carotid stenosis   . Tobacco abuse   . Diabetes mellitus (HCC)   . CKD (chronic kidney disease), stage II   . Aortic stenosis     a. Mild-mod by echo 01/2014.    Past Surgical History  Procedure Laterality Date  . Coronary artery bypass graft       Social History Social History  Substance Use Topics  . Smoking status: Current Every Day Smoker    Start date: 08/21/2015  . Smokeless tobacco: Never Used  . Alcohol Use: No    Family History Family History  Problem Relation Age of Onset  . Heart disease Mother   . Heart disease Father     Allergies  Allergies  Allergen Reactions  . Penicillins     REACTION: feet and hands swell  . Sulfa Antibiotics     Possible allergy     Current Outpatient Prescriptions  Medication Sig Dispense Refill  . aspirin 81 MG tablet Take 81 mg by mouth daily.    [name] atorvastatin (LIPITOR) 80 MG tablet Take 80 mg by mouth daily.    . Calcium Carbonate-Vitamin D (PX CALCIUM&D) 600-400 MG-UNIT per tablet Take 600 mg by mouth.    . Coenzyme Q10 (CO Q 10) 10 MG CAPS Take 100 mg by mouth.    . cyanocobalamin  1000 MCG tablet Take 100 mcg by mouth daily.    [name] donepezil (ARICEPT) 5 MG tablet Take 5 mg by mouth at bedtime.    . memantine (NAMENDA) 10 MG tablet Take 10 mg by mouth 2 (two) times daily.    . metFORMIN (GLUCOPHAGE) 500 MG tablet TAKE 1 TABLET (500 MG TOTAL) BY MOUTH DAILY.  5  . naproxen sodium (ANAPROX) 550 MG tablet Take 550 mg by mouth 2 (two) times daily as needed for mild pain.     . niacin (NIASPAN) 1000 MG CR tablet Take 1,000 mg by mouth at bedtime.    . Omega-3 Fatty Acids (FISH OIL) 1000 MG CAPS Take 1,000 mg by mouth 2 (two) times daily.    [name] omeprazole (PRILOSEC) 40 MG capsule Take 40 mg by mouth daily.    . propranolol ER (INDERAL LA) 120 MG 24 hr capsule Take 120 mg by mouth daily.    . ramipril (ALTACE) 10 MG capsule Take 10 mg by mouth daily.    . sildenafil (VIAGRA) 100 MG tablet Take 100 mg by mouth daily as needed for erectile dysfunction.    . clindamycin (CLEOCIN) 150 MG  capsule Take 150 mg by mouth 4 (four) times daily. Reported on 04/11/2016     No current facility-administered medications for this visit.    ROS:   General:  No weight loss, Fever, chills  HEENT: No recent headaches, no nasal bleeding, no visual changes, no sore throat  Neurologic: No dizziness, blackouts, seizures. No recent symptoms of stroke or mini- stroke. No recent episodes of slurred speech, or temporary blindness.  Cardiac: No recent episodes of chest pain/pressure, no shortness of breath at rest.  No shortness of breath with exertion.  Denies history of atrial fibrillation or irregular heartbeat  Vascular: No history of rest pain in feet.  No history of claudication.  No history of non-healing ulcer, No history of DVT   Pulmonary: No home oxygen, no productive cough, no hemoptysis,  No asthma or wheezing  Musculoskeletal:  [x ] Arthritis, [ ]  Low back pain,  [ ]  Joint pain  Hematologic:No history of hypercoagulable state.  No history of easy bleeding.  No history of  anemia  Gastrointestinal: No hematochezia or melena,  No gastroesophageal reflux, no trouble swallowing  Urinary: [ ]  chronic Kidney disease, [ ]  on HD - [ ]  MWF or [ ]  TTHS, [ ]  Burning with urination, [ ]  Frequent urination, [ ]  Difficulty urinating;   Skin: No rashes  Psychological: No history of anxiety,  No history of depression, memory loss   Physical Examination  Filed Vitals:   04/11/16 1532 04/11/16 1537  BP: 145/81 156/85  Pulse: 57 57  Temp: 98.7 F (37.1 C)   Resp: 18   Height: 6\' 2"  (1.88 m)   Weight: 185 lb (83.915 kg)   SpO2: 100%     Body mass index is 23.74 kg/(m^2).  General:  Alert and oriented, no acute distress HEENT: Normal Neck: No bruit or JVD Pulmonary: Clear to auscultation bilaterally Cardiac: Regular Rate and Rhythm without murmur Gastrointestinal: Soft, non-tender, non-distended, no mass, no scars Skin: No rash Extremity Pulses:  2+ radial, brachial, femoral, dorsalis pedis, non palpable posterior tibial pulses bilaterally Musculoskeletal: No deformity or edema  Neurologic: Upper and lower extremity motor 5/5 and symmetric  DATA:  Carotid duplex 09/29/2015 Right ,39% Left 60-79%    ASSESSMENT:  Carotid stenosis asymptomatic   PLAN: He reports no signs or symptoms of TIA or stroke.  He is in the watchful wait category of 60-79% left carotid stenosis.  If he has symptoms or his stenosis is greater than 80% we would recommend carotis endarterectomy.     We will have him follow up in 6 months with a carotid duplex.  If he has symptoms of TIA or stroke he will call 911.  Otherwise activity as tolerates and continue to take his daily aspirin, statin and control his BP as well as his DM.    Thomasena EdisOLLINS, EMMA [facility] Hospital[facility] PA-C [facility] Office: [phone]  The patient was seen in conjunction with Dr. Arbie CookeyEarly today  I have examined the patient, reviewed and agree with above.  Gretta BeganEarly, Eleonora Peeler, MD 04/11/2016 4:46  PM

## 2016-04-11 NOTE — Progress Notes (Signed)
Filed Vitals:   04/11/16 1532 04/11/16 1537  BP: 145/81 156/85  Pulse: 57 57  Temp: 98.7 F (37.1 C)   Resp: 18   Height: 6\' 2"  (1.88 m)   Weight: 185 lb (83.915 kg)   SpO2: 100%

## 2016-05-16 NOTE — Addendum Note (Signed)
Addended by: Sharee PimpleMCCHESNEY, Maggy Wyble K on: 05/16/2016 04:36 PM   Modules accepted: Orders

## 2016-07-11 ENCOUNTER — Ambulatory Visit: Payer: Medicare Other | Attending: Family Medicine | Admitting: Physical Therapy

## 2016-07-11 DIAGNOSIS — M25552 Pain in left hip: Secondary | ICD-10-CM | POA: Diagnosis not present

## 2016-07-11 DIAGNOSIS — R262 Difficulty in walking, not elsewhere classified: Secondary | ICD-10-CM | POA: Insufficient documentation

## 2016-07-11 NOTE — Therapy (Signed)
[facility] Outpatient Rehabilitation [facility] [address]  [address] [place], [place], [address] Phone: [phone]   Fax:  [phone]  Physical Therapy Evaluation  Patient Details  Name: Cory Bradley MRN: [id number] Date of Birth: 23-May-1933 Referring Provider: Dr. Everlene Other  Encounter Date: 07/11/2016      PT End of Session - 07/11/16 1404    Visit Number 1   Number of Visits 12   Date for PT Re-Evaluation 08/22/16   PT Start Time 1305   PT Stop Time 1345   PT Time Calculation (min) 40 min   Activity Tolerance Patient tolerated treatment well   Behavior During Therapy [facility] for tasks assessed/performed      Past Medical History:  Diagnosis Date  . Allergic rhinitis   . Aortic stenosis    a. Mild-mod by echo 01/2014.  [name] CKD (chronic kidney disease), stage II   . Coronary artery disease    a. acute inferior MI s/p PTCA of RCA followed by CABGx4 in 2005,  . Diabetes mellitus (HCC)   . ED (erectile dysfunction)   . Hyperlipidemia   . Hypertension   . Left carotid stenosis   . Memory loss   . Osteoarthritis   . Reflux   . Tobacco abuse     Past Surgical History:  Procedure Laterality Date  . CORONARY ARTERY BYPASS GRAFT      There were no vitals filed for this visit.       Subjective Assessment - 07/11/16 1310    Subjective Pt is an 82 y/o male who presents to OPPT with L sided low back pain radiating down LLE.  Pain is presents typically after prolonged sitting when trying to get up and walk.  Pt reports improvement in symptoms with each step and pain resolves after a few step.     Pertinent History see snapshot   How long can you sit comfortably? no problems sitting; prolonged sitting increases pain with return to standing   How long can you walk comfortably? only initial steps are uncomfortable   Diagnostic tests none   Patient Stated Goals improve pain   Currently in Pain? Yes   Pain Score 0-No pain  up to 6/10   Pain Location  Buttocks   Pain Orientation Left;Lateral   Pain Descriptors / Indicators Stabbing;Shooting;Burning   Pain Type Acute pain   Pain Radiating Towards LLE to mid calf   Pain Onset 1 to 4 weeks ago   Pain Frequency Intermittent   Aggravating Factors  standing up, and first few steps (increases with prolonged sitting), mild increase in pain with forward bending   Pain Relieving Factors walking            [facility] PT Assessment - 07/11/16 1316      Assessment   Medical Diagnosis L hip pain/sciatica   Referring Provider Dr. Everlene Other   Onset Date/Surgical Date --  2-3 weeks ago   Next MD Visit Sept 2017   Prior Therapy not that pt can recall     Precautions   Precautions None     Restrictions   Weight Bearing Restrictions No     Balance Screen   Has the patient fallen in the past 6 months No   Has the patient had a decrease in activity level because of a fear of falling?  Yes   Is the patient reluctant to leave their home because of a fear of falling?  No     Home  [employer] information [employer] manager residence   Living Arrangements Spouse/significant other   Type of Home House   Home Access Stairs to enter   Entrance Stairs-Number of Steps 6   Entrance Stairs-Rails Can reach both;Left;Right   Home Layout One level     Prior Function   Level of Independence Independent   Vocation Retired   Leisure fishing, Presenter, broadcasting     Cognition   Overall Cognitive Status History of cognitive impairments - at baseline     Observation/Other Assessments   Focus on Therapeutic Outcomes (FOTO)  69 (31% limited; predicted 27% limited)     Posture/Postural Control   Posture/Postural Control Postural limitations   Postural Limitations Decreased lumbar lordosis     AROM   AROM Assessment Site Lumbar   Lumbar Flexion 57  mild pain   Lumbar Extension 35   Lumbar - Right Side Bend 38   Lumbar - Left Side Bend 38     Strength   Strength Assessment Site Hip;Knee;Ankle   Right Hip Flexion  4/5   Right Hip Extension 5/5   Right Hip ABduction 5/5   Right Hip ADduction 5/5   Left Hip Flexion 4/5   Left Hip Extension 4/5   Left Hip ABduction 4-/5   Left Hip ADduction 4/5   Right/Left Knee Left;Right   Right Knee Flexion 5/5   Right Knee Extension 5/5   Left Knee Flexion 4/5   Left Knee Extension 5/5   Right Ankle Dorsiflexion 5/5   Left Ankle Dorsiflexion 5/5     Flexibility   Soft Tissue Assessment /Muscle Length yes   Hamstrings tightness LLE   Piriformis tightness LLE     Palpation   Spinal mobility hypomobile lumbar spine   SI assessment  tenderness L SIJ   Palpation comment tenderness L piriformis     Special Tests    Special Tests Lumbar   Lumbar Tests Straight Leg Raise     Straight Leg Raise   Findings Negative     Ambulation/Gait   Gait Comments amb 100' independently without difficulty                   [facility] Adult PT Treatment/Exercise - 07/11/16 1316      Exercises   Exercises Lumbar     Lumbar Exercises: Stretches   Passive Hamstring Stretch 2 reps;30 seconds   Passive Hamstring Stretch Limitations LLE; supine with strap   Piriformis Stretch 2 reps;30 seconds   Piriformis Stretch Limitations LLE     Lumbar Exercises: Supine   Bridge 10 reps;5 seconds                PT Education - 07/11/16 1404    Education provided Yes   Education Details clinical findings; POC, goals of care, HEP   Person(s) Educated Patient;Spouse   Methods Explanation;Demonstration;Handout   Comprehension Verbalized understanding;Returned demonstration;Need further instruction             PT Long Term Goals - 07/11/16 1409      PT LONG TERM GOAL #1   Title independent with HEP (08/22/16)   Time 6   Period Weeks   Status New     PT LONG TERM GOAL #2   Title verbalize understanding of posture and body mechanics to decrease risk of reinjury (08/22/16)   Time 6   Period Weeks   Status New     PT LONG TERM GOAL #3   Title report  ability to transition  from sit to stand/walking without increase in pain (08/22/16)   Time 6   Period Weeks   Status New     PT LONG TERM GOAL #4   Title improve L hip strength to 4+/5 for improved function (08/22/16)   Time 6   Period Weeks   Status New               Plan - 2016/07/27 1405    Clinical Impression Statement Pt is an 82 y/o male who presents to OPPT for moderate complexity PT evaluation of L back and hip pain radiating down LLE.  Clinical findings most consistent with SI dysfunction but can't rule out sciatica or lumbar radiculopathy.  Will plan to address deficits noted above including pain, weakness, hypomobility and impaired flexibility to maximize function and improve mobility.   Rehab Potential Good   PT Frequency 2x / week   PT Duration 6 weeks   PT Treatment/Interventions ADLs/Self Care Home Management;Cryotherapy;Electrical Stimulation;Moist Heat;Traction;Ultrasound;Therapeutic exercise;Therapeutic activities;Functional mobility training;Patient/family education;Manual techniques;Dry needling   PT Next Visit Plan review HEP; SI stabilization, core stabilization and flexibility, ?traction   Consulted and Agree with Plan of Care Patient;Family member/caregiver   Family Member Consulted wife - Dixie      Patient will benefit from skilled therapeutic intervention in order to improve the following deficits and impairments:  Decreased strength, Hypomobility, Pain, Impaired flexibility, Postural dysfunction, Difficulty walking  Visit Diagnosis: Pain in left hip - Plan: PT plan of care cert/re-cert  Difficulty in walking, not elsewhere classified - Plan: PT plan of care cert/re-cert      G-Codes - July 27, 2016 1412    Functional Assessment Tool Used FOTO 69, clinical judgement   Functional Limitation Changing and maintaining body position   Changing and Maintaining Body Position Current Status (Q6578) At least 20 percent but less than 40 percent impaired, limited or  restricted   Changing and Maintaining Body Position Goal Status (I6962) At least 20 percent but less than 40 percent impaired, limited or restricted       Problem List Patient Active Problem List   Diagnosis Date Noted  . Aortic stenosis   . CKD (chronic kidney disease), stage II   . Tobacco abuse   . Hypertension   . Hyperlipidemia      Clarita Crane, PT, DPT 07-27-2016 2:14 PM    [facility] Outpatient Rehabilitation [facility] [address]  [address] [place], [place], [address] Phone: [phone]   Fax:  [phone]  Name: SHANDON BURLINGAME MRN: [id number] Date of Birth: Oct 04, 1933

## 2016-07-11 NOTE — Patient Instructions (Signed)
Hamstring Step 2    Use strap, towel or dog leash. Left foot relaxed, knee straight, other leg bent, foot flat. Raise straight leg further upward to maximal range. Hold _20-30__ seconds. Relax leg completely down. Repeat _2-3__ times.  Perform 2-3 sessions per day.  Copyright  VHI. All rights reserved.    Hip Extension    Lie on back, legs in air, knees bent. Grasp hands behind one thigh and cross other leg over same thigh. Hold _20-30___ seconds.  Repeat __2-3__ times. Do __2-3__ sessions per day.  Copyright  VHI. All rights reserved.    Bridging    Slowly raise buttocks from floor, hold for 5 seconds. Repeat _10___ times per set. Do __1__ sets per session. Do __2-3__ sessions per day.  http://orth.exer.us/1096   Copyright  VHI. All rights reserved.

## 2016-07-14 ENCOUNTER — Ambulatory Visit: Payer: Medicare Other | Admitting: Physical Therapy

## 2016-07-14 DIAGNOSIS — M25552 Pain in left hip: Secondary | ICD-10-CM | POA: Diagnosis not present

## 2016-07-14 DIAGNOSIS — R262 Difficulty in walking, not elsewhere classified: Secondary | ICD-10-CM

## 2016-07-14 NOTE — Therapy (Signed)
[facility] Outpatient Rehabilitation [facility] [address]  [address] [place], [place], [address] Phone: [phone]   Fax:  [phone]  Physical Therapy Treatment  Patient Details  Name: Cory Bradley MRN: [id number] Date of Birth: 06/24/1933 Referring Provider: Dr. Everlene Other  Encounter Date: 07/14/2016      PT End of Session - 07/14/16 1046    Visit Number 2   Number of Visits 12   Date for PT Re-Evaluation 08/22/16   PT Start Time 1007   PT Stop Time 1046   PT Time Calculation (min) 39 min   Activity Tolerance Patient tolerated treatment well   Behavior During Therapy [facility] for tasks assessed/performed      Past Medical History:  Diagnosis Date  . Allergic rhinitis   . Aortic stenosis    a. Mild-mod by echo 01/2014.  [name] CKD (chronic kidney disease), stage II   . Coronary artery disease    a. acute inferior MI s/p PTCA of RCA followed by CABGx4 in 2005,  . Diabetes mellitus (HCC)   . ED (erectile dysfunction)   . Hyperlipidemia   . Hypertension   . Left carotid stenosis   . Memory loss   . Osteoarthritis   . Reflux   . Tobacco abuse     Past Surgical History:  Procedure Laterality Date  . CORONARY ARTERY BYPASS GRAFT      There were no vitals filed for this visit.      Subjective Assessment - 07/14/16 1008    Subjective back hurt more yesterday but today is better.     Pain Score 0-No pain                         [facility] Adult PT Treatment/Exercise - 07/14/16 1010      Lumbar Exercises: Stretches   Passive Hamstring Stretch 2 reps;30 seconds   Passive Hamstring Stretch Limitations LLE; supine with strap   Piriformis Stretch 2 reps;30 seconds   Piriformis Stretch Limitations LLE     Lumbar Exercises: Aerobic   Stationary Bike NuStep L 5 x 6 min     Lumbar Exercises: Standing   Other Standing Lumbar Exercises hip abdct and ext alt x 10 bil with blue theraband     Lumbar Exercises: Supine   Ab Set 10 reps;5  seconds   Clam 10 reps   Clam Limitations single limb clam with green theraband x 10 bil   Heel Slides 10 reps   Bent Knee Raise 10 reps   Bridge 10 reps;5 seconds   Bridge Limitations with ball squeeze x 10   Other Supine Lumbar Exercises isometric hip abdct with strap 10 x 5 sec   Other Supine Lumbar Exercises hooklying ball squeeze 10 x 5 sec     Lumbar Exercises: Prone   Straight Leg Raise 10 reps;3 seconds                     PT Long Term Goals - 07/11/16 1409      PT LONG TERM GOAL #1   Title independent with HEP (08/22/16)   Time 6   Period Weeks   Status New     PT LONG TERM GOAL #2   Title verbalize understanding of posture and body mechanics to decrease risk of reinjury (08/22/16)   Time 6   Period Weeks   Status New     PT LONG TERM GOAL #3   Title  report ability to transition from sit to stand/walking without increase in pain (08/22/16)   Time 6   Period Weeks   Status New     PT LONG TERM GOAL #4   Title improve L hip strength to 4+/5 for improved function (08/22/16)   Time 6   Period Weeks   Status New               Plan - 07/14/16 1046    Clinical Impression Statement Pt reports improvement in pain this morning and feels exercises have been beneficial.  Added core stabilization exercises to HEP today.  Will continue to benefit from PT to maximize function.   PT Treatment/Interventions ADLs/Self Care Home Management;Cryotherapy;Electrical Stimulation;Moist Heat;Traction;Ultrasound;Therapeutic exercise;Therapeutic activities;Functional mobility training;Patient/family education;Manual techniques;Dry needling   PT Next Visit Plan review HEP; SI stabilization, core stabilization and flexibility, ?traction   Consulted and Agree with Plan of Care Patient      Patient will benefit from skilled therapeutic intervention in order to improve the following deficits and impairments:  Decreased strength, Hypomobility, Pain, Impaired flexibility,  Postural dysfunction, Difficulty walking  Visit Diagnosis: Pain in left hip  Difficulty in walking, not elsewhere classified     Problem List Patient Active Problem List   Diagnosis Date Noted  . Aortic stenosis   . CKD (chronic kidney disease), stage II   . Tobacco abuse   . Hypertension   . Hyperlipidemia       Clarita Crane, PT, DPT 07/14/16 10:48 AM  [facility] [address]  [address] [place], [place], [address] Phone: [phone]   Fax:  [phone]  Name: Cory Bradley MRN: [id number] Date of Birth: 12-30-1932

## 2016-07-14 NOTE — Patient Instructions (Signed)
PELVIC TILT    Lie with hips and knees bent. Slowly inhale, and then exhale. Pull navel toward spine and tighten pelvic floor. Hold for __5_ seconds. Continue to breathe in and out during hold. Rest for _10__ seconds. Repeat __10_ times. Do __2-3_ times a day.   Copyright  VHI. All rights reserved.  Knee Fold   Lie on back, legs bent, arms by sides. Exhale, lifting knee to chest. Inhale, returning. Keep abdominals flat, navel to spine. Repeat __10__ times, alternating legs. Do __2__ sessions per day.  Copyright  VHI. All rights reserved.   Knee Drop   Keep pelvis stable. Without rotating hips, slowly drop knee to side, pause, return to center, bring knee across midline toward opposite hip. Feel obliques engaging. Repeat for ___10_ times each leg.      Slide one leg down to straight. Return. Be sure pelvis does not rock forward, tilt, rotate, or tip to side. Do _10__ times. Restabilize pelvis. Repeat with other leg. Do __1-2_ sets, __2_ times per day.  http://ss.exer.us/16   Copyright  VHI. All rights reserved.

## 2016-07-18 ENCOUNTER — Ambulatory Visit: Payer: Medicare Other | Attending: Family Medicine

## 2016-07-18 DIAGNOSIS — R262 Difficulty in walking, not elsewhere classified: Secondary | ICD-10-CM | POA: Diagnosis present

## 2016-07-18 DIAGNOSIS — M25552 Pain in left hip: Secondary | ICD-10-CM | POA: Diagnosis present

## 2016-07-18 NOTE — Therapy (Signed)
[facility] [address]  [address] [place], [place], [address] Phone: [phone]   Fax:  [phone]  Physical Therapy Treatment  Patient Details  Name: NAS WAFER MRN: [id number] Date of Birth: 11-07-1933 Referring Provider: Dr. Everlene Other  Encounter Date: 07/18/2016      PT End of Session - 07/18/16 1552    Visit Number 3   Number of Visits 12   Date for PT Re-Evaluation 08/22/16   PT Start Time 1537   PT Stop Time 1618   PT Time Calculation (min) 41 min   Activity Tolerance Patient tolerated treatment well   Behavior During Therapy [facility] for tasks assessed/performed      Past Medical History:  Diagnosis Date  . Allergic rhinitis   . Aortic stenosis    a. Mild-mod by echo 01/2014.  [name] CKD (chronic kidney disease), stage II   . Coronary artery disease    a. acute inferior MI s/p PTCA of RCA followed by CABGx4 in 2005,  . Diabetes mellitus (HCC)   . ED (erectile dysfunction)   . Hyperlipidemia   . Hypertension   . Left carotid stenosis   . Memory loss   . Osteoarthritis   . Reflux   . Tobacco abuse     Past Surgical History:  Procedure Laterality Date  . CORONARY ARTERY BYPASS GRAFT      There were no vitals filed for this visit.      Subjective Assessment - 07/18/16 1544    Subjective Pt. reports his LBP has greatly decreased following last treatment; pt. with 1/10 buttocks pain initially today.     Patient Stated Goals improve pain   Currently in Pain? Yes   Pain Score 1    Pain Location Buttocks   Pain Orientation Left;Lateral   Pain Descriptors / Indicators Stabbing   Pain Onset 1 to 4 weeks ago   Pain Frequency Intermittent   Multiple Pain Sites No      Today's Treatment:  Therex: NuStep: 5 min, level 4  B HS, piri, glute, SKTC x 30 sec each  Hooklying bridge with alternating hip abd/ER with blue TB x 5 reps each side Hooklying bridge with adduction ball squeeze x 10 reps B sidelying  clam shell with blue TB x 10 reps    HEP Review:  Hooklying bridge x 10 reps HS stretch x 30 sec  Modified piriformis stretch x 30 sec Pelvic tilt x 5" x 10 reps Hooklying LE march with abdom. bracing x 10 reps Hooklying knee fall out x 10 reps         PT Long Term Goals - 07/18/16 1556      PT LONG TERM GOAL #1   Title independent with HEP (08/22/16)   Time 6   Period Weeks   Status On-going     PT LONG TERM GOAL #2   Title verbalize understanding of posture and body mechanics to decrease risk of reinjury (08/22/16)   Time 6   Period Weeks   Status On-going     PT LONG TERM GOAL #3   Title report ability to transition from sit to stand/walking without increase in pain (08/22/16)   Time 6   Period Weeks   Status On-going     PT LONG TERM GOAL #4   Title improve L hip strength to 4+/5 for improved function (08/22/16)   Time 6   Period Weeks   Status On-going  Plan - 07/18/16 1553    Clinical Impression Statement Pt. reports his LBP has greatly decreased following last treatment; pt. with 1/10 buttocks pain initially today; pt. able to tolerate all therex with 0/10 LBP following treatment.  today's treatment focused on progression of lumbopelvic strengthening with continued LE stretching; pt. with good response to progressive increase in resistance however instructed to inform therapist next treatment of response in days following treatment.     PT Treatment/Interventions ADLs/Self Care Home Management;Cryotherapy;Electrical Stimulation;Moist Heat;Traction;Ultrasound;Therapeutic exercise;Therapeutic activities;Functional mobility training;Patient/family education;Manual techniques;Dry needling   PT Next Visit Plan SI stabilization, core stabilization and flexibility, ?traction      Patient will benefit from skilled therapeutic intervention in order to improve the following deficits and impairments:  Decreased strength, Hypomobility, Pain, Impaired  flexibility, Postural dysfunction, Difficulty walking  Visit Diagnosis: Pain in left hip  Difficulty in walking, not elsewhere classified     Problem List Patient Active Problem List   Diagnosis Date Noted  . Aortic stenosis   . CKD (chronic kidney disease), stage II   . Tobacco abuse   . Hypertension   . Hyperlipidemia     Kermit Balo, PTA 07/19/2016, 1:16 PM  [facility] [address]  [address] [place], [place], [address] Phone: [phone]   Fax:  [phone]  Name: ODIES HAMMACK MRN: [id number] Date of Birth: 1933-11-04

## 2016-07-21 ENCOUNTER — Ambulatory Visit: Payer: Medicare Other

## 2016-07-21 DIAGNOSIS — R262 Difficulty in walking, not elsewhere classified: Secondary | ICD-10-CM

## 2016-07-21 DIAGNOSIS — M25552 Pain in left hip: Secondary | ICD-10-CM

## 2016-07-21 NOTE — Therapy (Signed)
[facility] [address]  [address] [place], [place], [address] Phone: [phone]   Fax:  [phone]  Physical Therapy Treatment  Patient Details  Name: Cory Bradley MRN: [id number] Date of Birth: 1933/07/01 Referring Provider: Dr. Everlene Other  Encounter Date: 07/21/2016      PT End of Session - 07/21/16 1022    Visit Number 4   Number of Visits 12   Date for PT Re-Evaluation 08/22/16   PT Start Time 1015   PT Stop Time 1120   PT Time Calculation (min) 65 min   Activity Tolerance Patient tolerated treatment well   Behavior During Therapy [name] for tasks assessed/performed      Past Medical History:  Diagnosis Date  . Allergic rhinitis   . Aortic stenosis    a. Mild-mod by echo 01/2014.  [name] CKD (chronic kidney disease), stage II   . Coronary artery disease    a. acute inferior MI s/p PTCA of RCA followed by CABGx4 in 2005,  . Diabetes mellitus (HCC)   . ED (erectile dysfunction)   . Hyperlipidemia   . Hypertension   . Left carotid stenosis   . Memory loss   . Osteoarthritis   . Reflux   . Tobacco abuse     Past Surgical History:  Procedure Laterality Date  . CORONARY ARTERY BYPASS GRAFT      There were no vitals filed for this visit.      Subjective Assessment - 07/21/16 1020    Subjective Pt. reports [place] and had increased pain in the L hip later that day and since then.  Pt. seen with wallet in back L pocket of jeans today.    Currently in Pain? No/denies   Pain Score 0-No pain   Multiple Pain Sites No        Today's Treatment:  Therex: NuStep: 5 min, level 5  B HS, piri, glute, SKTC x 30 sec each  Hooklying bridge x 10 reps  Hooklying bridge with B hip abd/ER with blue TB x 10 reps Hooklying LE march with blue TB x 10 reps each  Hooklying bridge with alternating hip abd/ER with blue TB x 10 reps each side Hooklying bridge with adduction ball  squeeze x 15 reps R sidelying clam shell with blue TB x 15 reps  Standing L hip adduction, abduction, flexion, ext. With red TB x 10 reps; 2 pole support; CGA from therapist  Mechanical traction to lumbar spine: Hooklying, neutral pull, 60#/45#, 12', 20"/60"             PT Long Term Goals - 07/18/16 1556      PT LONG TERM GOAL #1   Title independent with HEP (08/22/16)   Time 6   Period Weeks   Status On-going     PT LONG TERM GOAL #2   Title verbalize understanding of posture and body mechanics to decrease risk of reinjury (08/22/16)   Time 6   Period Weeks   Status On-going     PT LONG TERM GOAL #3   Title report ability to transition from sit to stand/walking without increase in pain (08/22/16)   Time 6   Period Weeks   Status On-going     PT LONG TERM GOAL #4   Title improve L hip strength to 4+/5 for improved function (08/22/16)   Time 6   Period Weeks   Status On-going  Plan - 07/21/16 1036    Clinical Impression Statement Pt. reporting increased L hip pain upon rising in mornings and standing up from seated position today; initial L hip pain of 1/10.  Pt. reports he [place] the [place] on [place] and had increased pain in the L hip later that day and since then.  Pt. seen with wallet in back L pocket of jeans today.  Pt. instructed, while driving especially, to take wallet out of back L pocket.  Lumbopelvic strengthening activity progressed with increased repetitions today; pt. tolerated this well.  Lumbar traction in neutral pull in hooklying initiated today at 60#/45#; pt. tolerated this well; plan to f/u with pt. to see if benefit noted.  Pt. pain free following today's treatment.    PT Treatment/Interventions ADLs/Self Care Home Management;Cryotherapy;Electrical Stimulation;Moist Heat;Traction;Ultrasound;Therapeutic exercise;Therapeutic activities;Functional mobility training;Patient/family education;Manual techniques;Dry needling    PT Next Visit Plan Continue lumbar traction (neutral pull) if benefit noted. progress SI stabilization, core stabilization and flexibility      Patient will benefit from skilled therapeutic intervention in order to improve the following deficits and impairments:  Decreased strength, Hypomobility, Pain, Impaired flexibility, Postural dysfunction, Difficulty walking  Visit Diagnosis: Pain in left hip  Difficulty in walking, not elsewhere classified     Problem List Patient Active Problem List   Diagnosis Date Noted  . Aortic stenosis   . CKD (chronic kidney disease), stage II   . Tobacco abuse   . Hypertension   . Hyperlipidemia     Kermit Balo, PTA 07/21/2016, 11:24 AM  [facility] [address]  [address] [place], [place], [address] Phone: [phone]   Fax:  [phone]  Name: Cory Bradley MRN: [id number] Date of Birth: [birth year]

## 2016-07-25 ENCOUNTER — Ambulatory Visit: Payer: Medicare Other

## 2016-07-25 DIAGNOSIS — M25552 Pain in left hip: Secondary | ICD-10-CM | POA: Diagnosis not present

## 2016-07-25 DIAGNOSIS — R262 Difficulty in walking, not elsewhere classified: Secondary | ICD-10-CM

## 2016-07-25 NOTE — Therapy (Signed)
[facility] [address]  [address] [place], [place], [address] Phone: [phone]   Fax:  [phone]  Physical Therapy Treatment  Patient Details  Name: Cory Bradley MRN: [id number] Date of Birth: 1933-10-04 Referring Provider: Dr. Everlene Other  Encounter Date: 07/25/2016      PT End of Session - 07/25/16 1024    Visit Number 5   Number of Visits 12   Date for PT Re-Evaluation 08/22/16   PT Start Time 1017   PT Stop Time 1113   PT Time Calculation (min) 56 min   Activity Tolerance Patient tolerated treatment well   Behavior During Therapy [facility] for tasks assessed/performed      Past Medical History:  Diagnosis Date  . Allergic rhinitis   . Aortic stenosis    a. Mild-mod by echo 01/2014.  [name] CKD (chronic kidney disease), stage II   . Coronary artery disease    a. acute inferior MI s/p PTCA of RCA followed by CABGx4 in 2005,  . Diabetes mellitus (HCC)   . ED (erectile dysfunction)   . Hyperlipidemia   . Hypertension   . Left carotid stenosis   . Memory loss   . Osteoarthritis   . Reflux   . Tobacco abuse     Past Surgical History:  Procedure Laterality Date  . CORONARY ARTERY BYPASS GRAFT      There were no vitals filed for this visit.      Subjective Assessment - 07/25/16 1022    Subjective Pt. reports his back and L hip felt good following traction and requested this again today.     Patient Stated Goals improve pain   Currently in Pain? Yes   Pain Score 1    Pain Location Buttocks   Pain Orientation Left   Pain Descriptors / Indicators Stabbing   Pain Type Acute pain   Pain Onset 1 to 4 weeks ago   Pain Frequency Intermittent   Multiple Pain Sites No        Today's Treatment:  Therex: NuStep: 5 min, level 5  B HS, piri, glute, SKTC x 30 sec each  Single leg bridge x 10 reps each side  Hooklying sustained bridge with hip abduction/ER with black TB 5 x 3 reps B sidelying clam shell with black  TB x 15 reps   BATCA HS curl machine 35# x 10 reps; B concentric, B eccentric  BATCA HS curl machine 25# x 10 reps; B concentric, L eccentric Heel raise at UBE x 15 reps; B concentric, L eccentric  Stair training: Pt. Able to ascend/descend stairs with reciprocal gait pattern, no rail use, and good symmetrical control  Mechanical traction to lumbar spine: Hooklying, neutral pull, 65#/50#, 15', 20"/60"         PT Long Term Goals - 07/18/16 1556      PT LONG TERM GOAL #1   Title independent with HEP (08/22/16)   Time 6   Period Weeks   Status On-going     PT LONG TERM GOAL #2   Title verbalize understanding of posture and body mechanics to decrease risk of reinjury (08/22/16)   Time 6   Period Weeks   Status On-going     PT LONG TERM GOAL #3   Title report ability to transition from sit to stand/walking without increase in pain (08/22/16)   Time 6   Period Weeks   Status On-going     PT LONG TERM GOAL #4  Title improve L hip strength to 4+/5 for improved function (08/22/16)   Time 6   Period Weeks   Status On-going               Plan - 07/25/16 1038    Clinical Impression Statement Pt. reports his back and L hip felt good following traction and requested this again today.  Lumbar traction progressed to 65#/50# in hooklying neutral pull today with pt. reporting he was pain free following this.  Pt. tolerated increased intensity with therex today.  Black TB with supine hip strengthening activity and side stepping with green TB around ankles added today.  Pt. progressing well at this point.    PT Treatment/Interventions ADLs/Self Care Home Management;Cryotherapy;Electrical Stimulation;Moist Heat;Traction;Ultrasound;Therapeutic exercise;Therapeutic activities;Functional mobility training;Patient/family education;Manual techniques;Dry needling   PT Next Visit Plan Continue lumbar traction (neutral pull) if benefit noted. progress SI stabilization, core stabilization and  flexibility      Patient will benefit from skilled therapeutic intervention in order to improve the following deficits and impairments:  Decreased strength, Hypomobility, Pain, Impaired flexibility, Postural dysfunction, Difficulty walking  Visit Diagnosis: Pain in left hip  Difficulty in walking, not elsewhere classified     Problem List Patient Active Problem List   Diagnosis Date Noted  . Aortic stenosis   . CKD (chronic kidney disease), stage II   . Tobacco abuse   . Hypertension   . Hyperlipidemia     Kermit BaloMicah Russie Gulledge, PTA 07/25/2016, 12:57 PM  [facility] [address]  [address] [place], [place], [address] Phone: [phone]   Fax:  [phone]  Name: Cory Bradley MRN: [id number] Date of Birth: 10/04/1933

## 2016-07-27 ENCOUNTER — Encounter (HOSPITAL_COMMUNITY): Payer: Self-pay | Admitting: Emergency Medicine

## 2016-07-27 ENCOUNTER — Emergency Department (HOSPITAL_COMMUNITY): Payer: Medicare Other

## 2016-07-27 ENCOUNTER — Emergency Department (HOSPITAL_COMMUNITY)
Admission: EM | Admit: 2016-07-27 | Discharge: 2016-07-28 | Disposition: A | Discharge status: Home / Self Care | Payer: Medicare Other | Attending: Emergency Medicine | Admitting: Emergency Medicine

## 2016-07-27 DIAGNOSIS — E1122 Type 2 diabetes mellitus with diabetic chronic kidney disease: Secondary | ICD-10-CM | POA: Diagnosis not present

## 2016-07-27 DIAGNOSIS — I471 Supraventricular tachycardia: Secondary | ICD-10-CM | POA: Diagnosis not present

## 2016-07-27 DIAGNOSIS — I252 Old myocardial infarction: Secondary | ICD-10-CM | POA: Insufficient documentation

## 2016-07-27 DIAGNOSIS — Z7984 Long term (current) use of oral hypoglycemic drugs: Secondary | ICD-10-CM | POA: Insufficient documentation

## 2016-07-27 DIAGNOSIS — R000 Tachycardia, unspecified: Secondary | ICD-10-CM | POA: Diagnosis present

## 2016-07-27 DIAGNOSIS — F17200 Nicotine dependence, unspecified, uncomplicated: Secondary | ICD-10-CM | POA: Insufficient documentation

## 2016-07-27 DIAGNOSIS — Z7982 Long term (current) use of aspirin: Secondary | ICD-10-CM | POA: Insufficient documentation

## 2016-07-27 DIAGNOSIS — Z951 Presence of aortocoronary bypass graft: Secondary | ICD-10-CM | POA: Insufficient documentation

## 2016-07-27 DIAGNOSIS — N182 Chronic kidney disease, stage 2 (mild): Secondary | ICD-10-CM | POA: Insufficient documentation

## 2016-07-27 DIAGNOSIS — I2510 Atherosclerotic heart disease of native coronary artery without angina pectoris: Secondary | ICD-10-CM | POA: Insufficient documentation

## 2016-07-27 DIAGNOSIS — I129 Hypertensive chronic kidney disease with stage 1 through stage 4 chronic kidney disease, or unspecified chronic kidney disease: Secondary | ICD-10-CM | POA: Diagnosis not present

## 2016-07-27 LAB — BASIC METABOLIC PANEL
Anion gap: 6 (ref 5–15)
BUN: 15 mg/dL (ref 6–20)
CALCIUM: 8.5 mg/dL — AB (ref 8.9–10.3)
CO2: 24 mmol/L (ref 22–32)
CREATININE: 1.16 mg/dL (ref 0.61–1.24)
Chloride: 107 mmol/L (ref 101–111)
GFR calc Af Amer: >60 mL/min (ref >60)
GFR, EST NON AFRICAN AMERICAN: 57 mL/min — AB (ref >60)
Glucose, Bld: 170 mg/dL — ABNORMAL HIGH (ref 65–99)
Potassium: 4.1 mmol/L (ref 3.5–5.1)
SODIUM: 137 mmol/L (ref 135–145)

## 2016-07-27 LAB — CBC
HCT: 39.6 % (ref 39.0–52.0)
Hemoglobin: 12.9 g/dL — ABNORMAL LOW (ref 13.0–17.0)
MCH: 30 pg (ref 26.0–34.0)
MCHC: 32.6 g/dL (ref 30.0–36.0)
MCV: 92.1 fL (ref 78.0–100.0)
PLATELETS: 164 10*3/uL (ref 150–400)
RBC: 4.3 MIL/uL (ref 4.22–5.81)
RDW: 13 % (ref 11.5–15.5)
WBC: 8.5 10*3/uL (ref 4.0–10.5)

## 2016-07-27 LAB — I-STAT TROPONIN, ED: TROPONIN I, POC: 0.04 ng/mL (ref 0.00–0.08)

## 2016-07-27 NOTE — ED Triage Notes (Signed)
Pt presents to the ED via EMS after experiencing rapid heart rate.  Pt was at home.  Pt was found to be in SVT, BP 77/35, HR 150, and diaphoretic. Adenosine given by EMS and the pt converted to a HR of 70.  Denies pain at this time.

## 2016-07-27 NOTE — ED Provider Notes (Signed)
[facility]-EMERGENCY DEPT Provider Note   CSN: [id number] Arrival date & time: 07/27/16  2214  By signing my name below, I, Phillis HaggisGabriella Gaje, attest that this documentation has been prepared under the direction and in the presence of Alvira MondayErin Nilam Quakenbush, MD. Electronically Signed: Phillis HaggisGabriella Gaje, ED Scribe. 07/27/16. 9:34 AM. First Provider Contact:  First MD Initiated Contact with Patient 07/27/16 2301     History   Chief Complaint Chief Complaint  Patient presents with  . Tachycardia    The history is provided by the patient. No language interpreter was used.  HPI Comments: Cory Bradley is a 82 y.o. Male with a hx of aortic stenosis, CKD, CAD, DM, HTN, carotid stenosis and MI brought in by EMS who presents to the Emergency Department complaining of sudden onset rapid HR onset 4 hours ago. Pt reports that he took a shower and sat down when he began to feel an increased HR for 5 minutes. He reports associated SOB and lightheadedness. He was found to be in SVT with a HR of 150 by EMS and given Adenosine en route to relief. Pt's HR resolved to 70. He states that he drank 2-3 cups of coffee this morning. Pt is on Propanolol but did not take his dose tonight. He denies new medications, recent illness, fever, diaphoresis, chest pain, leg swelling, leg pain, nausea, diarrhea, or vomiting.   Past Medical History:  Diagnosis Date  . Allergic rhinitis   . Aortic stenosis    a. Mild-mod by echo 01/2014.  [name]. CKD (chronic kidney disease), stage II   . Coronary artery disease    a. acute inferior MI s/p PTCA of RCA followed by CABGx4 in 2005,  . Diabetes mellitus (HCC)   . ED (erectile dysfunction)   . Hyperlipidemia   . Hypertension   . Left carotid stenosis   . Memory loss   . Osteoarthritis   . Reflux   . Tobacco abuse     Patient Active Problem List   Diagnosis Date Noted  . Aortic stenosis   . CKD (chronic kidney disease), stage II   . Tobacco abuse   . Hypertension   . Hyperlipidemia      Past Surgical History:  Procedure Laterality Date  . CORONARY ARTERY BYPASS GRAFT     Home Medications    Prior to Admission medications   Medication Sig Start Date End Date Taking? Authorizing Provider  aspirin 81 MG tablet Take 81 mg by mouth daily.    Historical Provider, MD  atorvastatin (LIPITOR) 80 MG tablet Take 80 mg by mouth daily.    Historical Provider, MD  Calcium Carbonate-Vitamin D (PX CALCIUM&D) 600-400 MG-UNIT per tablet Take 600 mg by mouth.    Historical Provider, MD  clindamycin (CLEOCIN) 150 MG capsule Take 150 mg by mouth 4 (four) times daily. Reported on 04/11/2016    Historical Provider, MD  Coenzyme Q10 (CO Q 10) 10 MG CAPS Take 100 mg by mouth.    Historical Provider, MD  cyanocobalamin 1000 MCG tablet Take 100 mcg by mouth daily.    Historical Provider, MD  donepezil (ARICEPT) 5 MG tablet Take 5 mg by mouth at bedtime.    Historical Provider, MD  memantine (NAMENDA) 10 MG tablet Take 10 mg by mouth 2 (two) times daily.    Historical Provider, MD  metFORMIN (GLUCOPHAGE) 500 MG tablet TAKE 1 TABLET (500 MG TOTAL) BY MOUTH DAILY. 08/10/15   Historical Provider, MD  naproxen sodium (ANAPROX) 550 MG tablet Take 550  mg by mouth 2 (two) times daily as needed for mild pain.  04/29/15   Historical Provider, MD  niacin (NIASPAN) 1000 MG CR tablet Take 1,000 mg by mouth at bedtime.    Historical Provider, MD  Omega-3 Fatty Acids (FISH OIL) 1000 MG CAPS Take 1,000 mg by mouth 2 (two) times daily.    Historical Provider, MD  omeprazole (PRILOSEC) 40 MG capsule Take 40 mg by mouth daily.    Historical Provider, MD  propranolol ER (INDERAL LA) 120 MG 24 hr capsule Take 120 mg by mouth daily.    Historical Provider, MD  ramipril (ALTACE) 10 MG capsule Take 10 mg by mouth daily.    Historical Provider, MD  sildenafil (VIAGRA) 100 MG tablet Take 100 mg by mouth daily as needed for erectile dysfunction.    Historical Provider, MD   Family History Family History  Problem Relation  Age of Onset  . Heart disease Mother   . Heart disease Father     Social History Social History  Substance Use Topics  . Smoking status: Current Every Day Smoker    Start date: 08/21/2015  . Smokeless tobacco: Never Used  . Alcohol use No   Allergies   Penicillins and Sulfa antibiotics  Review of Systems Review of Systems  Constitutional: Negative for diaphoresis and fever.  Respiratory: Positive for shortness of breath.   Cardiovascular: Positive for palpitations. Negative for chest pain and leg swelling.  Musculoskeletal: Negative for arthralgias.  Neurological: Positive for light-headedness.  All other systems reviewed and are negative.  Physical Exam Updated Vital Signs BP 109/65   Pulse (!) 58   Resp 16   Ht 6\' 2"  (1.88 m)   Wt 185 lb (83.9 kg)   SpO2 93%   BMI 23.75 kg/m   Physical Exam  Constitutional: He is oriented to person, place, and time. He appears well-developed and well-nourished.  HENT:  Head: Normocephalic and atraumatic.  Neck: Normal range of motion. Neck supple.  Cardiovascular: Normal rate, regular rhythm and intact distal pulses.   Murmur heard. Pulmonary/Chest: Effort normal and breath sounds normal. No respiratory distress. He has no wheezes. He has no rales.  Abdominal: Soft. There is no tenderness.  Musculoskeletal: He exhibits no edema.  Neurological: He is alert and oriented to person, place, and time.  Skin: Skin is warm and dry.  Psychiatric: He has a normal mood and affect. His behavior is normal.  Nursing note and vitals reviewed.  ED Treatments / Results  DIAGNOSTIC STUDIES: Oxygen Saturation is 95% on RA, normal by my interpretation.    COORDINATION OF CARE: 11:14 PM-Discussed treatment plan which includes labs, x-ray, and EKG with pt at bedside and pt agreed to plan.    Labs (all labs ordered are listed, but only abnormal results are displayed) Labs Reviewed  BASIC METABOLIC PANEL - Abnormal; Notable for the following:        Result Value   Glucose, Bld 170 (*)    Calcium 8.5 (*)    GFR calc non Af Amer 57 (*)    All other components within normal limits  CBC - Abnormal; Notable for the following:    Hemoglobin 12.9 (*)    All other components within normal limits  I-STAT TROPOININ, ED    EKG  EKG Interpretation  Date/Time:  Thursday July 27 2016 22:31:12 EDT Ventricular Rate:  66 PR Interval:    QRS Duration: 111 QT Interval:  446 QTC Calculation: 468 R Axis:   -51  Text Interpretation:  Sinus rhythm Probable left atrial enlargement LAD, consider left anterior fascicular block Abnormal R-wave progression, late transition No significant change since last tracing Confirmed by Kandis Mannan ([id number]) on 07/27/2016 10:38:06 PM Also confirmed by Kandis Mannan ([id number]), editor Valentina Lucks CT, Jola Babinski [phone])  on 07/28/2016 7:23:18 AM       Radiology Dg Chest 2 View  Result Date: 07/27/2016 CLINICAL DATA:  Acute onset of supraventricular tachycardia. Initial encounter. EXAM: CHEST  2 VIEW COMPARISON:  Chest radiograph performed 02/16/2010 FINDINGS: The lungs are well-aerated and clear. There is no evidence of focal opacification, pleural effusion or pneumothorax. The heart is normal in size. The patient is status post median sternotomy, with evidence of prior CABG. A small hiatal hernia is suggested. No acute osseous abnormalities are seen. IMPRESSION: 1. No acute cardiopulmonary process seen. 2. Suggestion of small hiatal hernia. Electronically Signed   By: Roanna Raider M.D.   On: 07/27/2016 23:04    Procedures Procedures (including critical care time)  Medications Ordered in ED Medications - No data to display   Initial Impression / Assessment and Plan / ED Course  I have reviewed the triage vital signs and the nursing notes.  Pertinent labs & imaging results that were available during my care of the patient were reviewed by me and considered in my medical decision making (see chart for  details).  Clinical Course   82 year old male with a history of aortic stenosis, coronary artery disease status post CABG, CK D, diabetes, hypertension, hyperlipidemia, left carotid stenosis presents with concern for episode of tachycardia. On EMS arrival, patient was found to be in what on the rhythm strip reviewed appears to be SVT (with other possibility being atrial flutter with 1-1, however no sign of flutter waves on rhythm strip after adenosine.)  His heart rate was initially 150, with a blood pressure in the 70s. He was given 6 mg of IV adenosine by EMS, with improvement in his heart rate, and resolution of his symptoms.   Given patient denies any associated chest pain, have low suspicion for acute coronary syndrome. He does not have any leg pain or swelling, no chest pain or shortness of breath, and have low suspicion for pulmonary embolus. Patient does not have significant electrolyte abnormalities to account for this.  Labs have returned to normal, and a low suspicion for acute thyroid emergency, however recommend outpatient thyroid studies. Denies hx of current infection.   Patient with symptom resolution with EMS, and is asymptomatic in the emergency department. Blood pressures are in the 110s systolic. He was observed without any further episodes of arrhythmia. Feel patient is stable for outpatient evaluation and follow-up. Rhythm strip from EMS does not appear consistent with atrial fibrillation nor flutter and do not feel anticoagulation is indicated at this time.  Given pt on propranolol and has some borderline BP in ED, will not initiate other rate control med at this time. Discussed reasons to return in detail. Patient to follow-up with his cardiologist Dr. Katrinka Blazing.  Final Clinical Impressions(s) / ED Diagnoses   Final diagnoses:  SVT (supraventricular tachycardia) (HCC)  I personally performed the services described in this documentation, which was scribed in my presence. The  recorded information has been reviewed and is accurate.    New Prescriptions Discharge Medication List as of 07/28/2016 12:26 AM       Alvira Monday, MD 07/28/16 [phone]

## 2016-07-28 ENCOUNTER — Ambulatory Visit: Payer: Medicare Other

## 2016-07-31 ENCOUNTER — Ambulatory Visit: Payer: Medicare Other

## 2016-07-31 DIAGNOSIS — M25552 Pain in left hip: Secondary | ICD-10-CM | POA: Diagnosis not present

## 2016-07-31 DIAGNOSIS — R262 Difficulty in walking, not elsewhere classified: Secondary | ICD-10-CM

## 2016-07-31 NOTE — Therapy (Signed)
[facility] [place] [address]  [address] [place], [place], [address] Phone: [phone]   Fax:  [phone]  Physical Therapy Treatment  Patient Details  Name: Cory Bradley MRN: [id number] Date of Birth: 07/06/1933 Referring Provider: Dr. Everlene OtherBouska  Encounter Date: 07/31/2016      PT End of Session - 07/31/16 1424    Visit Number 6   Number of Visits 12   Date for PT Re-Evaluation 08/22/16   PT Start Time 1407   PT Stop Time 1501   PT Time Calculation (min) 54 min   Activity Tolerance Patient tolerated treatment well   Behavior During Therapy [facility] for tasks assessed/performed      Past Medical History:  Diagnosis Date  . Allergic rhinitis   . Aortic stenosis    a. Mild-mod by echo 01/2014.  [name]. CKD (chronic kidney disease), stage II   . Coronary artery disease    a. acute inferior MI s/p PTCA of RCA followed by CABGx4 in 2005,  . Diabetes mellitus (HCC)   . ED (erectile dysfunction)   . Hyperlipidemia   . Hypertension   . Left carotid stenosis   . Memory loss   . Osteoarthritis   . Reflux   . Tobacco abuse     Past Surgical History:  Procedure Laterality Date  . CORONARY ARTERY BYPASS GRAFT      There were no vitals filed for this visit.      Subjective Assessment - 07/31/16 1411    Subjective Pt. reports his back and L hip pain have been better each day and is infrequent at this point; pain free initially today.     Patient Stated Goals improve pain   Currently in Pain? No/denies   Pain Score 0-No pain   Multiple Pain Sites No       Today's Treatment:  Therex: Recumbent bike: level 4, 5 min  B HS, piri, glute, SKTC x 30 sec each  Single leg bridge x 10 reps each side  B sidelying clam shell with black TB x 15 reps   BATCA HS curl machine 45# x 10 reps; B concentric, B eccentric  BATCA HS curl machine 35# x 10 reps; B concentric, L eccentric Heel raise at UBE x 20 reps; B concentric, L eccentric B  standing hip ext, abd, add, flexion with green TB around ankles x 10 reps each way TRX squats x 15 reps   * BP 144/86 mmHG, 65 bpm following therex   Mechanical traction to lumbar spine: Hooklying, neutral pull, 68#/53#, 15', 20"/60"        PT Long Term Goals - 07/18/16 1556      PT LONG TERM GOAL #1   Title independent with HEP (08/22/16)   Time 6   Period Weeks   Status On-going     PT LONG TERM GOAL #2   Title verbalize understanding of posture and body mechanics to decrease risk of reinjury (08/22/16)   Time 6   Period Weeks   Status On-going     PT LONG TERM GOAL #3   Title report ability to transition from sit to stand/walking without increase in pain (08/22/16)   Time 6   Period Weeks   Status On-going     PT LONG TERM GOAL #4   Title improve L hip strength to 4+/5 for improved function (08/22/16)   Time 6   Period Weeks   Status On-going  Plan - 07/31/16 1424    Clinical Impression Statement Pt. reports his back and L hip pain have been better each day and is infrequent at this point; pain free initially today.  Pt. tolerated advancement of lumbopelvic strengthening activity today very well and continues to report benefit from lumbar traction.  Lumbar traction progressed today to 68#/53# with pt. tolerating well.  Pt. progressing well at this point.        PT Treatment/Interventions ADLs/Self Care Home Management;Cryotherapy;Electrical Stimulation;Moist Heat;Traction;Ultrasound;Therapeutic exercise;Therapeutic activities;Functional mobility training;Patient/family education;Manual techniques;Dry needling   PT Next Visit Plan Continue lumbar traction (neutral pull) if benefit noted. progress SI stabilization, core stabilization and flexibility      Patient will benefit from skilled therapeutic intervention in order to improve the following deficits and impairments:  Decreased strength, Hypomobility, Pain, Impaired flexibility, Postural dysfunction,  Difficulty walking  Visit Diagnosis: Pain in left hip  Difficulty in walking, not elsewhere classified     Problem List Patient Active Problem List   Diagnosis Date Noted  . Aortic stenosis   . CKD (chronic kidney disease), stage II   . Tobacco abuse   . Hypertension   . Hyperlipidemia     Kermit BaloMicah Tip Atienza, PTA 07/31/2016, 4:25 PM  [facility] [place] [address]  [address] [place], [place], [address] Phone: [phone]   Fax:  [phone]  Name: Cory Bradley MRN: [id number] Date of Birth: 10/23/1933

## 2016-08-01 ENCOUNTER — Ambulatory Visit: Payer: Medicare Other

## 2016-08-04 ENCOUNTER — Ambulatory Visit: Payer: Medicare Other | Admitting: Physical Therapy

## 2016-08-04 DIAGNOSIS — R262 Difficulty in walking, not elsewhere classified: Secondary | ICD-10-CM

## 2016-08-04 DIAGNOSIS — M25552 Pain in left hip: Secondary | ICD-10-CM | POA: Diagnosis not present

## 2016-08-04 NOTE — Therapy (Signed)
[facility] [address]  [address] [place], [place], [address] Phone: [phone]   Fax:  [phone]  Physical Therapy Treatment  Patient Details  Name: Cory GroutCharles E Newnam MRN: [id number] Date of Birth: 02/13/1933 Referring Provider: Dr. Everlene OtherBouska  Encounter Date: 08/04/2016      PT End of Session - 08/04/16 1056    Visit Number 7   Number of Visits 12   Date for PT Re-Evaluation 08/22/16   PT Start Time 1013   PT Stop Time 1110   PT Time Calculation (min) 57 min   Activity Tolerance Patient tolerated treatment well   Behavior During Therapy [facility] for tasks assessed/performed      Past Medical History:  Diagnosis Date  . Allergic rhinitis   . Aortic stenosis    a. Mild-mod by echo 01/2014.  [name]. CKD (chronic kidney disease), stage II   . Coronary artery disease    a. acute inferior MI s/p PTCA of RCA followed by CABGx4 in 2005,  . Diabetes mellitus (HCC)   . ED (erectile dysfunction)   . Hyperlipidemia   . Hypertension   . Left carotid stenosis   . Memory loss   . Osteoarthritis   . Reflux   . Tobacco abuse     Past Surgical History:  Procedure Laterality Date  . CORONARY ARTERY BYPASS GRAFT      There were no vitals filed for this visit.      Subjective Assessment - 08/04/16 1012    Subjective no pain today; reports "mild" pain.  "I wouldn't even call it pain."   Pertinent History see snapshot   How long can you sit comfortably? no problems sitting; prolonged sitting increases pain with return to standing   How long can you walk comfortably? intermittently still having pain with first few steps "it's never intense"   Patient Stated Goals improve pain   Currently in Pain? No/denies                         [facility]/Exercise - 08/04/16 1016      Lumbar Exercises: Stretches   Single Knee to Chest Stretch 2 reps;30 seconds   Piriformis Stretch 2 reps;30 seconds   Piriformis  Stretch Limitations bil     Lumbar Exercises: Aerobic   Stationary Bike NuStep L 7 x 6 min     Lumbar Exercises: Machines for Strengthening   Cybex Knee Extension 35# 2x10   Cybex Knee Flexion 35# 2x10   Leg Press 55# 2x10     Lumbar Exercises: Standing   Heel Raises 20 reps;2 seconds   Heel Raises Limitations off UBE   Other Standing Lumbar Exercises fitter: 2 blue extension x 10 bil; 1 blue 1 black abduction x 10 bil     Lumbar Exercises: Prone   Straight Leg Raise 10 reps;3 seconds   Straight Leg Raises Limitations 3#   Opposite Arm/Leg Raise Right arm/Left leg;Left arm/Right leg;10 reps;3 seconds   Opposite Arm/Leg Raise Limitations 3# on bil LEs     Modalities   Modalities Traction     Traction   Type of Traction Lumbar   Min (lbs) 53   Max (lbs) 68   Hold Time 60   Rest Time 20   Time 15                     PT Long Term Goals - 07/18/16 1556  PT LONG TERM GOAL #1   Title independent with HEP (08/22/16)   Time 6   Period Weeks   Status On-going     PT LONG TERM GOAL #2   Title verbalize understanding of posture and body mechanics to decrease risk of reinjury (08/22/16)   Time 6   Period Weeks   Status On-going     PT LONG TERM GOAL #3   Title report ability to transition from sit to stand/walking without increase in pain (08/22/16)   Time 6   Period Weeks   Status On-going     PT LONG TERM GOAL #4   Title improve L hip strength to 4+/5 for improved function (08/22/16)   Time 6   Period Weeks   Status On-going               Plan - 08/04/16 1056    Clinical Impression Statement Pt has progressed well with PT and reports improved pain every day.  Traction seems to be very beneficial to pt.  Anticipate may be ready for hold after next week and transition to HEP/community fitness.   PT Next Visit Plan review/update HEP, give any gym activities; anticipate hopeful hold from PT after next week.   Consulted and Agree with Plan of Care  Patient      Patient will benefit from skilled therapeutic intervention in order to improve the following deficits and impairments:  Decreased strength, Hypomobility, Pain, Impaired flexibility, Postural dysfunction, Difficulty walking  Visit Diagnosis: Pain in left hip  Difficulty in walking, not elsewhere classified     Problem List Patient Active Problem List   Diagnosis Date Noted  . Aortic stenosis   . CKD (chronic kidney disease), stage II   . Tobacco abuse   . Hypertension   . Hyperlipidemia       Clarita CraneStephanie F Delwin Raczkowski, PT, DPT 08/04/16 11:51 AM    [facility] [place] [address]  [address] [place], [place], [address] Phone: [phone]   Fax:  [phone]  Name: Cory GroutCharles E Mastrianni MRN: [id number] Date of Birth: 06/21/1933

## 2016-08-08 ENCOUNTER — Ambulatory Visit: Payer: Medicare Other

## 2016-08-08 DIAGNOSIS — M25552 Pain in left hip: Secondary | ICD-10-CM | POA: Diagnosis not present

## 2016-08-08 DIAGNOSIS — R262 Difficulty in walking, not elsewhere classified: Secondary | ICD-10-CM

## 2016-08-08 NOTE — Therapy (Signed)
[facility] [place] [address]  [address] [place], [place], [address] Phone: [phone]   Fax:  [phone]  Physical Therapy Treatment  Patient Details  Name: Cory Bradley MRN: [id number] Date of Birth: 04/29/1933 Referring Provider: Dr. Everlene OtherBouska  Encounter Date: 08/08/2016      PT End of Session - 08/08/16 1030    Visit Number 8   Number of Visits 12   Date for PT Re-Evaluation 08/22/16   PT Start Time 1020   PT Stop Time 1100   PT Time Calculation (min) 40 min   Activity Tolerance Patient tolerated treatment well   Behavior During Therapy [facility] for tasks assessed/performed      Past Medical History:  Diagnosis Date  . Allergic rhinitis   . Aortic stenosis    a. Mild-mod by echo 01/2014.  [name]. CKD (chronic kidney disease), stage II   . Coronary artery disease    a. acute inferior MI s/p PTCA of RCA followed by CABGx4 in 2005,  . Diabetes mellitus (HCC)   . ED (erectile dysfunction)   . Hyperlipidemia   . Hypertension   . Left carotid stenosis   . Memory loss   . Osteoarthritis   . Reflux   . Tobacco abuse     Past Surgical History:  Procedure Laterality Date  . CORONARY ARTERY BYPASS GRAFT      There were no vitals filed for this visit.          [facility] Assessment - 08/08/16 1042      Strength   Strength Assessment Site Hip;Knee   Right Hip Flexion 4+/5   Right Hip Extension 4+/5   Right Hip ABduction 5/5   Right Hip ADduction 5/5   Left Hip Flexion 4+/5   Left Hip Extension 4+/5   Left Hip ABduction 4+/5   Left Hip ADduction 5/5   Right/Left Knee Left;Right   Right Knee Flexion 5/5   Right Knee Extension 5/5   Left Knee Flexion 4+/5   Left Knee Extension 5/5   Right/Left Ankle Right;Left   Right Ankle Dorsiflexion 5/5   Left Ankle Dorsiflexion 5/5      Today's treatment:  Therex: Recumbent bike: level 4, 5 min  B HS, glute, SKTC stretch x 30 sec each Hooklying bridge with alternating  hip abd/ER with black TB x 10 reps each side  Hooklying LE march with black TB around knees x 10 reps each side   Goal testing   MMT testing          PT Education - 08/08/16 1246    Education provided Yes   Education Details bridge with abduction with black TB, LE march with black TB    Person(s) Educated Patient   Methods Handout;Explanation;Verbal cues   Comprehension Verbalized understanding;Returned demonstration;Need further instruction             PT Long Term Goals - 08/08/16 1040      PT LONG TERM GOAL #1   Title independent with HEP (08/22/16)   Time 6   Period Weeks   Status Achieved     PT LONG TERM GOAL #2   Title verbalize understanding of posture and body mechanics to decrease risk of reinjury (08/22/16)   Time 6   Period Weeks   Status Achieved     PT LONG TERM GOAL #3   Title report ability to transition from sit to stand/walking without increase in pain (08/22/16)   Time  6   Period Weeks   Status Achieved     PT LONG TERM GOAL #4   Title improve L hip strength to 4+/5 for improved function (08/22/16)   Time 6   Period Weeks   Status Achieved               Plan - 08/08/16 1036    Clinical Impression Statement Pt. pain free initially reporting infrequent L hip pain which is mild in nature at this point.  Pt. is now able to perform transitional movements without pain and was able to demo 4+/5 or better L hip strength.  Today's treatment focused on reviewing/updating HEP.  Pt. was able to perform all updated HEP activities well and anticipates PT appointment on Monday and being put on hold from therapy.     PT Treatment/Interventions ADLs/Self Care Home Management;Cryotherapy;Electrical Stimulation;Moist Heat;Traction;Ultrasound;Therapeutic exercise;Therapeutic activities;Functional mobility training;Patient/family education;Manual techniques;Dry needling   PT Next Visit Plan Anticipates hopeful hold from PT after next week.      Patient will  benefit from skilled therapeutic intervention in order to improve the following deficits and impairments:  Decreased strength, Hypomobility, Pain, Impaired flexibility, Postural dysfunction, Difficulty walking  Visit Diagnosis: Pain in left hip  Difficulty in walking, not elsewhere classified     Problem List Patient Active Problem List   Diagnosis Date Noted  . Aortic stenosis   . CKD (chronic kidney disease), stage II   . Tobacco abuse   . Hypertension   . Hyperlipidemia     Kermit BaloMicah Everlee Quakenbush, PTA 08/08/2016, 12:58 PM  [facility] [place] [address]  [address] [place], [place], [address] Phone: [phone]   Fax:  [phone]  Name: Cory Bradley MRN: [id number] Date of Birth: 10/15/1933

## 2016-08-11 ENCOUNTER — Ambulatory Visit: Payer: Medicare Other

## 2016-08-11 ENCOUNTER — Ambulatory Visit: Payer: Medicare Other | Admitting: Cardiology

## 2016-08-14 ENCOUNTER — Ambulatory Visit: Payer: Medicare Other | Admitting: Physical Therapy

## 2016-08-14 DIAGNOSIS — M25552 Pain in left hip: Secondary | ICD-10-CM | POA: Diagnosis not present

## 2016-08-14 DIAGNOSIS — R262 Difficulty in walking, not elsewhere classified: Secondary | ICD-10-CM

## 2016-08-14 NOTE — Patient Instructions (Signed)
  GYM PROGRAM  Cardio: 1. Seated Stepper 2. Recumbent Bike 3. Treadmill  Weights: 1. Knee Extension  (45 lbs 2x10) 2. Hamstring Curls  (45 lbs 2x10) 3. Leg Press  (55 lbs 2x10) 4. Hip Abduction / Adduction 5. Hip Extension (they may not have this) 6. Rows  (You want your muscles to feel tired for the last 4-5 reps - guide for determining how much weight to use)

## 2016-08-14 NOTE — Therapy (Addendum)
[facility] [address]  [place], [place], [address] Phone: [phone]   Fax:  [phone]  Physical Therapy Treatment  Patient Details  Name: Cory Bradley MRN: [id number] Date of Birth: 1932-12-29 Referring Provider: Dr. Coletta Memos  Encounter Date: 08/14/2016      PT End of Session - 08/14/16 0956    Visit Number 9   PT Start Time 0931   PT Stop Time 0955   PT Time Calculation (min) 24 min   Activity Tolerance Patient tolerated treatment well   Behavior During Therapy [facility] for tasks assessed/performed      Past Medical History:  Diagnosis Date  . Allergic rhinitis   . Aortic stenosis    a. Mild-mod by echo 01/2014.  [name] CKD (chronic kidney disease), stage II   . Coronary artery disease    a. acute inferior MI s/p PTCA of RCA followed by CABGx4 in 2005,  . Diabetes mellitus ([place])   . ED (erectile dysfunction)   . Hyperlipidemia   . Hypertension   . Left carotid stenosis   . Memory loss   . Osteoarthritis   . Reflux   . Tobacco abuse     Past Surgical History:  Procedure Laterality Date  . CORONARY ARTERY BYPASS GRAFT      There were no vitals filed for this visit.      Subjective Assessment - 08/14/16 0931    Subjective doing really well, back felt good all weekend   How long can you sit comfortably? no problems sitting; prolonged sitting increases pain with return to standing   How long can you walk comfortably? no pain with first few steps x 3 days   Diagnostic tests none   Patient Stated Goals improve pain   Currently in Pain? No/denies            [facility] PT Assessment - 08/14/16 0959      Observation/Other Assessments   Focus on Therapeutic Outcomes (FOTO)  98 (2% limited)                     [facility] Adult PT Treatment/Exercise - 08/14/16 0934      Self-Care   Self-Care Other Self-Care Comments   Other Self-Care Comments  reviewed safe and appropriate fitness center  exercises     Lumbar Exercises: Aerobic   Stationary Bike NuStep L 7 x 6 min     Lumbar Exercises: Machines for Strengthening   Cybex Knee Extension 45# 2x10   Cybex Knee Flexion 45# 2x10   Leg Press 55# 2x10   Other Lumbar Machine Exercise seated rows 35# 2x10                PT Education - 08/14/16 0956    Education provided Yes   Education Details fitness center exercise   Person(s) Educated Patient   Methods Explanation;Demonstration;Handout   Comprehension Verbalized understanding;Returned demonstration             PT Long Term Goals - 08/08/16 1040      PT LONG TERM GOAL #1   Title independent with HEP (08/22/16)   Time 6   Period Weeks   Status Achieved     PT LONG TERM GOAL #2   Title verbalize understanding of posture and body mechanics to decrease risk of reinjury (08/22/16)   Time 6   Period Weeks   Status Achieved     PT LONG TERM GOAL #3  Title report ability to transition from sit to stand/walking without increase in pain (08/22/16)   Time 6   Period Weeks   Status Achieved     PT LONG TERM GOAL #4   Title improve L hip strength to 4+/5 for improved function (08/22/16)   Time 6   Period Weeks   Status Achieved               Plan - 2016-09-12 0956    Clinical Impression Statement Pt has met all goals at this time and is requesting to hold PT x 30 days.  If pt doesn't return will d/c otherwise will reassess and update plan as needed.   PT Treatment/Interventions ADLs/Self Care Home Management;Cryotherapy;Electrical Stimulation;Moist Heat;Traction;Ultrasound;Therapeutic exercise;Therapeutic activities;Functional mobility training;Patient/family education;Manual techniques;Dry needling   PT Next Visit Plan d/c PT if pt doesn't return, otherwise reassess   Consulted and Agree with Plan of Care Patient      Patient will benefit from skilled therapeutic intervention in order to improve the following deficits and impairments:  Decreased  strength, Hypomobility, Pain, Impaired flexibility, Postural dysfunction, Difficulty walking  Visit Diagnosis: Pain in left hip  Difficulty in walking, not elsewhere classified       G-Codes - September 12, 2016 1000    Functional Assessment Tool Used FOTO 98, clinical judgement   Functional Limitation Changing and maintaining body position   Changing and Maintaining Body Position Goal Status (V2919) At least 20 percent but less than 40 percent impaired, limited or restricted   Changing and Maintaining Body Position Discharge Status (T6606) At least 1 percent but less than 20 percent impaired, limited or restricted      Problem List Patient Active Problem List   Diagnosis Date Noted  . Aortic stenosis   . CKD (chronic kidney disease), stage II   . Tobacco abuse   . Hypertension   . Hyperlipidemia        Laureen Abrahams, PT, DPT Sep 12, 2016 10:00 AM   [facility] [address]  [place], [place], [address] Phone: [phone]   Fax:  [phone]  Name: Cory Bradley MRN: [id number] Date of Birth: August 02, 1933         PHYSICAL THERAPY DISCHARGE SUMMARY  Visits from Start of Care: 9  Current functional level related to goals / functional outcomes: See above    Remaining deficits: n/a   Education / Equipment: HEP, posture/body mechanics  Plan: Patient agrees to discharge.  Patient goals were met. Patient is being discharged due to meeting the stated rehab goals.  ?????    Laureen Abrahams, PT, DPT 09/14/16 2:21 PM  [facility] [place], [place] [address]  [phone] (office) [phone] (fax)

## 2016-09-12 ENCOUNTER — Encounter: Payer: Self-pay | Admitting: Physician Assistant

## 2016-09-12 NOTE — Progress Notes (Signed)
Cardiology Office Note    Date:  09/13/2016   ID:  Cory Bradley, DOB 11/08/1933, MRN [id number]  PCP:  Aura DialsBOUSKA,DAVID E, MD  Cardiologist:  Dr. Katrinka BlazingSmith   CC: follow up   History of Present Illness:  Cory Bradley is a 82 y.o. male with a history of CAD s/p acute inferior MI with PTCA of RCA followed by CABG 4 in 2005, history of tobacco abuse, aortic stenosis, DM, HLD, HTN, and stage II CKD who presents to clinic for cardiology follow up.   He was last seen in the office by Dr. Myrtis SerKatz on 09/10/2015 to reestablish care with cardiology service since he has not seen Dr. Katrinka BlazingSmith for many years. Previous echocardiogram in 01/2014 showed EF 55-60%, moderate LVH, severe left atrial enlargement, mild Mac with mild MR, mild-to-moderate aortic stenosis. Given loud murmur heard on physical exam, echocardiogram was repeated on 09/29/2015 which showed EF 60-65%, no regional wall motion abnormality, grade 1 diastolic dysfunction, moderately to severely calcified aortic annulus with moderate stenosis. Carotid ultrasound obtained on the same day showed only mild right ICA stenosis, moderate 60-79% left ICA stenosis. The plan is to repeat a carotid Doppler in 6 month.  He was seen by Azalee CourseHao Meng PA-C in 02/2016 and was doing well. He was referred to vascular surgery for carotid artery disease. Dr. Arbie CookeyEarly plans for watchful waiting.   Seen in ER 07/2016 for SVT that broke with adenosine given by EMS en route. He was discharged home on same meds given borderline BPs. He had not taken his propranolol that day and also had drank 3 cups of coffee.   Today he presents to clinic for follow up. He has had some pain in his left pectoral region that lasts seconds. It is not a pain at all. It maybe happens 1-2 x a month and doesn't bother him at all. He regularly exercises at the gym 4x a week. He does leg exercises and walks for at least 40s minutes. No chest pain or SOB with this. No LE edema, orthopnea or PND. No syncope but  sometimes gets lightheaded when he stands up too quickly. He does get anxious at the Dr. [name] Moosffice due to feeling like he always hears about another problems and his BP goes up. He has a long hx of white coat syndrome. He took his BP this AM and it was 128/78.  Past Medical History:  Diagnosis Date  . Allergic rhinitis   . Aortic stenosis    a. Mild-mod by echo 01/2014.  [name]. CKD (chronic kidney disease), stage II   . Coronary artery disease    a. acute inferior MI s/p PTCA of RCA followed by CABGx4 in 2005,  . Diabetes mellitus (HCC)   . ED (erectile dysfunction)   . Hyperlipidemia   . Hypertension   . Left carotid stenosis   . Memory loss   . Osteoarthritis   . Reflux   . Tobacco abuse     Past Surgical History:  Procedure Laterality Date  . CORONARY ARTERY BYPASS GRAFT      Current Medications: Outpatient Medications Prior to Visit  Medication Sig Dispense Refill  . aspirin 81 MG tablet Take 81 mg by mouth daily.    [name]. atorvastatin (LIPITOR) 80 MG tablet Take 80 mg by mouth daily.    . Calcium Carbonate-Vitamin D (PX CALCIUM&D) 600-400 MG-UNIT per tablet Take 600 mg by mouth.    . clindamycin (CLEOCIN) 150 MG capsule Take 150 mg by mouth  4 (four) times daily. Reported on 04/11/2016    . Coenzyme Q10 (CO Q 10) 10 MG CAPS Take 100 mg by mouth.    . cyanocobalamin 1000 MCG tablet Take 100 mcg by mouth daily.    [name] donepezil (ARICEPT) 5 MG tablet Take 5 mg by mouth at bedtime.    . memantine (NAMENDA) 10 MG tablet Take 10 mg by mouth 2 (two) times daily.    . metFORMIN (GLUCOPHAGE) 500 MG tablet TAKE 1 TABLET (500 MG TOTAL) BY MOUTH DAILY.  5  . naproxen sodium (ANAPROX) 550 MG tablet Take 550 mg by mouth 2 (two) times daily as needed for mild pain.     . niacin (NIASPAN) 1000 MG CR tablet Take 1,000 mg by mouth at bedtime.    . Omega-3 Fatty Acids (FISH OIL) 1000 MG CAPS Take 1,000 mg by mouth 2 (two) times daily.    [name] omeprazole (PRILOSEC) 40 MG capsule Take 40 mg by mouth daily.      . propranolol ER (INDERAL LA) 120 MG 24 hr capsule Take 120 mg by mouth daily.    . ramipril (ALTACE) 10 MG capsule Take 10 mg by mouth daily.    . sildenafil (VIAGRA) 100 MG tablet Take 100 mg by mouth daily as needed for erectile dysfunction.     No facility-administered medications prior to visit.      Allergies:   Penicillins and Sulfa antibiotics   Social History   Social History  . Marital status: Married    Spouse name: N/A  . Number of children: N/A  . Years of education: N/A   Social History Main Topics  . Smoking status: Current Every Day Smoker    Start date: 08/21/2015  . Smokeless tobacco: Never Used  . Alcohol use No  . Drug use: No  . Sexual activity: Not Asked   Other Topics Concern  . None   Social History Narrative  . None     Family History:  The patient's family history includes Heart disease in his father and mother.     ROS:   Please see the history of present illness.    ROS All other systems reviewed and are negative.   PHYSICAL EXAM:   VS:  Ht 6\' 2"  (1.88 m)   Wt 185 lb 12.8 oz (84.3 kg)   BMI 23.86 kg/m    GEN: Well nourished, well developed, in no acute distress  HEENT: normal  Neck: no JVD, +carotid bruits, or masses Cardiac: RRR; 4/6 SEM @ RUSB, rubs, or gallops,no edema  Respiratory:  clear to auscultation bilaterally, normal work of breathing GI: soft, nontender, nondistended, + BS MS: no deformity or atrophy  Skin: warm and dry, no rash Neuro:  Alert and Oriented x 3, Strength and sensation are intact Psych: euthymic mood, full affect  Wt Readings from Last 3 Encounters:  09/13/16 185 lb 12.8 oz (84.3 kg)  07/27/16 185 lb (83.9 kg)  04/11/16 185 lb (83.9 kg)      Studies/Labs Reviewed:   EKG:  EKG is NOT ordered today.    Recent Labs: 07/27/2016: BUN 15; Creatinine, Ser 1.16; Hemoglobin 12.9; Platelets 164; Potassium 4.1; Sodium 137     Additional studies/ records that were reviewed today include:  Echo  09/29/2015 LV EF: 60% - 65% Study Conclusions - Left ventricle: The cavity size was normal. Wall thickness was  increased in a pattern of mild LVH. Systolic function was normal.  The estimated ejection fraction was in  the range of 60% to 65%.  Wall motion was normal; there were no regional wall motion  abnormalities. Doppler parameters are consistent with abnormal  left ventricular relaxation (grade 1 diastolic dysfunction). - Aortic valve: Moderately to severely calcified annulus.  Moderately thickened, moderately calcified leaflets. There was  moderate stenosis. Valve area (VTI): 2 cm^2. Valve area (Vmax):  1.39 cm^2. Valve area (Vmean): 1.42 cm^2. - Left atrium: The atrium was mildly dilated. - Right atrium: The atrium was moderately dilated.   Carotid U/S 09/29/2015 He has 1-39% RICA stenosis and 60-79% LICA stenosis which explains the bruit heard on the left side. Also >50% bilateral ECA stenosis. Normal subclavian arteries, bilaterally.    ASSESSMENT & PLAN:   1. Moderate AS, asymptomatic             - discussed symptom of severe AS including exertional chest pain, SOB, dizziness, presyncope, he has none of them. He is fairly active and denies any symptom             - plan to repeat 2D ECHO now  2. Moderate L carotid artery stenosis: established care with Dr. Arbie Cookey in 03/2016. Watchful waiting until he develops sx or stenosis >80%. Plan was for repeat dopplers. He will get this next month.   3. CAD s/p acute inferior MI with PTCA of RCA followed by CABG 4 in 2005: no angina. Continue ASA, statin and BB   4. DM II: on metformin 5. HLD: on lipitor 6. HTN: well controlled on ramipril and propranolol. BP high in office today but he has a long hx of white coat syndrome. He took his BP this AM and it was 128/78. 7. Stage II CKD: GFR 57 8. SVT: one episode in 07/2016 with no recurrence. Continue propranolol for now.     Medication Adjustments/Labs and Tests  Ordered: Current medicines are reviewed at length with the patient today.  Concerns regarding medicines are outlined above.  Medication changes, Labs and Tests ordered today are listed in the Patient Instructions below. There are no Patient Instructions on file for this visit.   Signed, Cline Crock, PA-C  09/13/2016 9:18 AM    [facility] [address] [place], [place], [place]  [address] Phone: [phone]; Fax: [phone]

## 2016-09-13 ENCOUNTER — Encounter: Payer: Self-pay | Admitting: Physician Assistant

## 2016-09-13 ENCOUNTER — Ambulatory Visit (INDEPENDENT_AMBULATORY_CARE_PROVIDER_SITE_OTHER): Payer: Medicare Other | Admitting: Physician Assistant

## 2016-09-13 VITALS — Ht 74.0 in | Wt 185.8 lb

## 2016-09-13 DIAGNOSIS — N189 Chronic kidney disease, unspecified: Secondary | ICD-10-CM

## 2016-09-13 DIAGNOSIS — I471 Supraventricular tachycardia: Secondary | ICD-10-CM | POA: Diagnosis not present

## 2016-09-13 DIAGNOSIS — I350 Nonrheumatic aortic (valve) stenosis: Secondary | ICD-10-CM | POA: Diagnosis not present

## 2016-09-13 DIAGNOSIS — I779 Disorder of arteries and arterioles, unspecified: Secondary | ICD-10-CM

## 2016-09-13 DIAGNOSIS — I2510 Atherosclerotic heart disease of native coronary artery without angina pectoris: Secondary | ICD-10-CM

## 2016-09-13 DIAGNOSIS — E118 Type 2 diabetes mellitus with unspecified complications: Secondary | ICD-10-CM

## 2016-09-13 DIAGNOSIS — I10 Essential (primary) hypertension: Secondary | ICD-10-CM

## 2016-09-13 DIAGNOSIS — I739 Peripheral vascular disease, unspecified: Secondary | ICD-10-CM

## 2016-09-13 NOTE — Patient Instructions (Signed)
Medication Instructions:  Your physician recommends that you continue on your current medications as directed. Please refer to the Current Medication list given to you today.   Labwork: None ordered  Testing/Procedures: Your physician has requested that you have an echocardiogram. Echocardiography is a painless test that uses sound waves to create images of your heart. It provides your doctor with information about the size and shape of your heart and how well your heart's chambers and valves are working. This procedure takes approximately one hour. There are no restrictions for this procedure.    Follow-Up: Your physician wants you to follow-up in: 6 MONTHS WITH DR. Marlou StarksSMITH You will receive a reminder letter in the mail two months in advance. If you don't receive a letter, please call our office to schedule the follow-up appointment.   Any Other Special Instructions Will Be Listed Below (If Applicable).  Echocardiogram An echocardiogram, or echocardiography, uses sound waves (ultrasound) to produce an image of your heart. The echocardiogram is simple, painless, obtained within a short period of time, and offers valuable information to your health care provider. The images from an echocardiogram can provide information such as:  Evidence of coronary artery disease (CAD).  Heart size.  Heart muscle function.  Heart valve function.  Aneurysm detection.  Evidence of a past heart attack.  Fluid buildup around the heart.  Heart muscle thickening.  Assess heart valve function. LET [facility] PROVIDER KNOW ABOUT:  Any allergies you have.  All medicines you are taking, including vitamins, herbs, eye drops, creams, and over-the-counter medicines.  Previous problems you or members of your family have had with the use of anesthetics.  Any blood disorders you have.  Previous surgeries you have had.  Medical conditions you have.  Possibility of pregnancy, if this  applies. BEFORE THE PROCEDURE  No special preparation is needed. Eat and drink normally.  PROCEDURE   In order to produce an image of your heart, gel will be applied to your chest and a wand-like tool (transducer) will be moved over your chest. The gel will help transmit the sound waves from the transducer. The sound waves will harmlessly bounce off your heart to allow the heart images to be captured in real-time motion. These images will then be recorded.  You may need an IV to receive a medicine that improves the quality of the pictures. AFTER THE PROCEDURE You may return to your normal schedule including diet, activities, and medicines, unless your health care provider tells you otherwise.   This information is not intended to replace advice given to you by your health care provider. Make sure you discuss any questions you have with your health care provider.   Document Released: 12/01/2000 Document Revised: 12/25/2014 Document Reviewed: 08/11/2013 Elsevier Interactive Patient Education [employer]2016 [employer].    If you need a refill on your cardiac medications before your next appointment, please call your pharmacy.

## 2016-09-26 ENCOUNTER — Ambulatory Visit (HOSPITAL_COMMUNITY): Payer: Medicare Other | Attending: Physician Assistant

## 2016-09-26 ENCOUNTER — Other Ambulatory Visit: Payer: Self-pay

## 2016-09-26 DIAGNOSIS — I2510 Atherosclerotic heart disease of native coronary artery without angina pectoris: Secondary | ICD-10-CM | POA: Diagnosis not present

## 2016-09-26 DIAGNOSIS — I350 Nonrheumatic aortic (valve) stenosis: Secondary | ICD-10-CM | POA: Insufficient documentation

## 2016-09-26 DIAGNOSIS — I129 Hypertensive chronic kidney disease with stage 1 through stage 4 chronic kidney disease, or unspecified chronic kidney disease: Secondary | ICD-10-CM | POA: Insufficient documentation

## 2016-09-26 DIAGNOSIS — E785 Hyperlipidemia, unspecified: Secondary | ICD-10-CM | POA: Insufficient documentation

## 2016-09-26 DIAGNOSIS — E1122 Type 2 diabetes mellitus with diabetic chronic kidney disease: Secondary | ICD-10-CM | POA: Insufficient documentation

## 2016-09-26 DIAGNOSIS — Z951 Presence of aortocoronary bypass graft: Secondary | ICD-10-CM | POA: Diagnosis not present

## 2016-09-26 DIAGNOSIS — N189 Chronic kidney disease, unspecified: Secondary | ICD-10-CM | POA: Diagnosis not present

## 2016-10-18 DIAGNOSIS — I499 Cardiac arrhythmia, unspecified: Secondary | ICD-10-CM

## 2016-10-18 HISTORY — DX: Cardiac arrhythmia, unspecified: I49.9

## 2016-11-01 ENCOUNTER — Encounter: Payer: Self-pay | Admitting: Vascular Surgery

## 2016-11-14 ENCOUNTER — Encounter: Payer: Self-pay | Admitting: Family

## 2016-11-14 ENCOUNTER — Ambulatory Visit (HOSPITAL_COMMUNITY)
Admission: RE | Admit: 2016-11-14 | Discharge: 2016-11-14 | Disposition: A | Discharge status: Home / Self Care | Payer: Medicare Other | Source: Ambulatory Visit | Attending: Vascular Surgery | Admitting: Vascular Surgery

## 2016-11-14 ENCOUNTER — Ambulatory Visit (INDEPENDENT_AMBULATORY_CARE_PROVIDER_SITE_OTHER): Payer: Medicare Other | Admitting: Family

## 2016-11-14 VITALS — BP 130/69 | HR 53 | Temp 98.1°F | Ht 74.0 in | Wt 177.0 lb

## 2016-11-14 DIAGNOSIS — I6522 Occlusion and stenosis of left carotid artery: Secondary | ICD-10-CM | POA: Diagnosis not present

## 2016-11-14 DIAGNOSIS — I6523 Occlusion and stenosis of bilateral carotid arteries: Secondary | ICD-10-CM | POA: Diagnosis not present

## 2016-11-14 LAB — VAS US CAROTID
LCCADSYS: -71 cm/s
LCCAPDIAS: 16 cm/s
LEFT ECA DIAS: -13 cm/s
Left CCA dist dias: -15 cm/s
Left CCA prox sys: 79 cm/s
Left ICA dist dias: -24 cm/s
Left ICA dist sys: -68 cm/s
Left ICA prox dias: -49 cm/s
Left ICA prox sys: -167 cm/s
RCCADSYS: -69 cm/s
RCCAPDIAS: 13 cm/s
RIGHT CCA MID DIAS: 13 cm/s
RIGHT ECA DIAS: -11 cm/s
Right CCA prox sys: 94 cm/s

## 2016-11-14 NOTE — Progress Notes (Signed)
Chief Complaint: Follow up Extracranial Carotid Artery Stenosis   History of Present Illness  Cory Bradley is a 82 y.o. male patient that Dr. Arbie Cookey and Lianne Cure, PA-C,  evaluated initially on 04-11-16, referred by Ronie Spies PA-C, for evaluation of carotid stenosis. He returns today for follow up. He denies any known history of stroke or TIA. Specifically he denies a history of amaurosis fugax or monocular blindness, unilateral facial drooping, hemiplegia, or receptive or expressive aphasia.    The patient is currently on antiplatelet therapy in the form of 81 mg aspirin daily.  The carotid stenosis was found due to audible bruit on the left and a duplex carotid was performed. Other medical problems include CAD s/p CABG x 4, hyperlipidemia managed with Lipitor, DM managed with metformin, and hypertension managed with propanolol.    He reports that he has a hx of sciatica that no longer bothers him.  He denies claudication sx's with walking.    Pt Diabetic: yes Pt smoker: smoker  (1/3 ppd, started in 2016 after he found that his wife was in the last stages of pancreatic cancer, states he had also smoked in the past)  Pt meds include: Statin : yes ASA: yes Other anticoagulants/antiplatelets: no   Past Medical History:  Diagnosis Date  . Allergic rhinitis   . Aortic stenosis    a. Mild-mod by echo 01/2014.  [name] CKD (chronic kidney disease), stage II   . Coronary artery disease    a. acute inferior MI s/p PTCA of RCA followed by CABGx4 in 2005,  . Diabetes mellitus (HCC)   . ED (erectile dysfunction)   . Hyperlipidemia   . Hypertension   . Left carotid stenosis   . Memory loss   . Osteoarthritis   . Reflux   . Tobacco abuse     Social History Social History  Substance Use Topics  . Smoking status: Current Every Day Smoker    Start date: 08/21/2015  . Smokeless tobacco: Never Used  . Alcohol use No    Family History Family History  Problem Relation Age of  Onset  . Heart disease Mother   . Heart disease Father     Surgical History Past Surgical History:  Procedure Laterality Date  . CORONARY ARTERY BYPASS GRAFT      Allergies  Allergen Reactions  . Penicillins     REACTION: feet and hands swell  . Sulfa Antibiotics     Possible allergy    Current Outpatient Prescriptions  Medication Sig Dispense Refill  . aspirin 81 MG tablet Take 81 mg by mouth daily.    [name] atorvastatin (LIPITOR) 80 MG tablet Take 80 mg by mouth daily.    [name] donepezil (ARICEPT) 5 MG tablet Take 5 mg by mouth at bedtime.    . memantine (NAMENDA) 10 MG tablet Take 10 mg by mouth 2 (two) times daily.    . metFORMIN (GLUCOPHAGE) 500 MG tablet TAKE 1 TABLET (500 MG TOTAL) BY MOUTH DAILY.  5  . naproxen sodium (ANAPROX) 550 MG tablet Take 550 mg by mouth 2 (two) times daily as needed for mild pain.     . niacin (NIASPAN) 1000 MG CR tablet Take 1,000 mg by mouth at bedtime.    . Omega-3 Fatty Acids (FISH OIL) 1000 MG CAPS Take 1,000 mg by mouth 2 (two) times daily.    [name] omeprazole (PRILOSEC) 40 MG capsule Take 40 mg by mouth daily.    . propranolol ER (INDERAL LA)  120 MG 24 hr capsule Take 120 mg by mouth daily.    . ramipril (ALTACE) 10 MG capsule Take 10 mg by mouth daily.    . sildenafil (VIAGRA) 100 MG tablet Take 100 mg by mouth daily as needed for erectile dysfunction.    . temazepam (RESTORIL) 15 MG capsule Take 15 mg by mouth at bedtime as needed.      No current facility-administered medications for this visit.     Review of Systems : See HPI for pertinent positives and negatives.  Physical Examination  Vitals:   11/14/16 1127 11/14/16 1129  BP: 124/70 130/69  Pulse: (!) 53   Temp: 98.1 F (36.7 C)   SpO2: 99%   Weight: 177 lb (80.3 kg)   Height: 6\' 2"  (1.88 m)    Body mass index is 22.73 kg/m.  General: WDWN male in NAD GAIT: normal Eyes: PERRLA Pulmonary:  Respirations are non-labored, fair air movement, CTAB  Cardiac: regular rhythm, +  murmur.  VASCULAR EXAM Carotid Bruits Right Left   Negative Positive    Aorta is not palpable. Radial pulses are 2+ palpable and equal.                                                                                                                            LE Pulses Right Left       POPLITEAL  not palpable   not palpable       POSTERIOR TIBIAL   palpable    palpable        DORSALIS PEDIS      ANTERIOR TIBIAL not palpable  not palpable     Gastrointestinal: soft, nontender, BS WNL, no r/g, no palpable masses.  Musculoskeletal: No muscle atrophy/wasting. M/S 5/5 throughout, extremities without ischemic changes. Mild-moderate kyphosis.   Neurologic: A&O X 3; Appropriate Affect, Speech is normal CN 2-12 intact, pain and light touch intact in extremities, Motor exam as listed above.     Assessment: Cory Bradley is a 82 y.o. male who has a left carotid bruit, no history of stroke or TIA.  His DM seems to be well controlled on metformin as his only DM agent. He has resumed smoking since his wife was diagnosed with advanced pancreatic cancer, states he has no intent to quit.   DATA Today's carotid duplex suggests no significant stenosis of the bilateral ECA or CCA. Right ICA with <40% stenosis, left proximal ICA with 40-59% stenosis Bilateral vertebral artery flow is antegrade. Bilateral subclavian artery waveforms are normal. No previous carotid duplex at this facility for comparison.   Carotid duplex 1uplex0/11/2015 from another facility: Right ICA: <40% Left ICA: 60-79%   Plan: Follow-up in 6 months with Carotid Duplex scan.   I discussed in depth with the patient the nature of atherosclerosis, and emphasized the importance of maximal medical management including strict control of blood pressure, blood glucose, and lipid levels, obtaining regular exercise, and cessation of smoking.  The  patient is aware that without maximal medical management the underlying  atherosclerotic disease process will progress, limiting the benefit of any interventions. The patient was given information about stroke prevention and what symptoms should prompt the patient to seek immediate medical care. Thank you for allowing us to participate in this patient's care.  Charisse MarchSuzanne Eliott Amparan, RN, MSN, FNP-C [facility] Office: [phone]  Clinic Physician: Early  11/14/16 11:43 AM

## 2016-11-14 NOTE — Addendum Note (Signed)
Addended by: Burton ApleyPETTY, Chasmine Lender A on: 11/14/2016 01:30 PM   Modules accepted: Orders

## 2016-11-14 NOTE — Patient Instructions (Signed)
Stroke Prevention Some medical conditions and behaviors are associated with an increased chance of having a stroke. You may prevent a stroke by making healthy choices and managing medical conditions. How can I reduce my risk of having a stroke?  Stay physically active. Get at least 30 minutes of activity on most or all days.  Do not smoke. It may also be helpful to avoid exposure to secondhand smoke.  Limit alcohol use. Moderate alcohol use is considered to be:  No more than 2 drinks per day for men.  No more than 1 drink per day for nonpregnant women.  Eat healthy foods. This involves:  Eating 5 or more servings of fruits and vegetables a day.  Making dietary changes that address high blood pressure (hypertension), high cholesterol, diabetes, or obesity.  Manage your cholesterol levels.  Making food choices that are high in fiber and low in saturated fat, trans fat, and cholesterol may control cholesterol levels.  Take any prescribed medicines to control cholesterol as directed by your health care provider.  Manage your diabetes.  Controlling your carbohydrate and sugar intake is recommended to manage diabetes.  Take any prescribed medicines to control diabetes as directed by your health care provider.  Control your hypertension.  Making food choices that are low in salt (sodium), saturated fat, trans fat, and cholesterol is recommended to manage hypertension.  Ask your health care provider if you need treatment to lower your blood pressure. Take any prescribed medicines to control hypertension as directed by your health care provider.  If you are 18-39 years of age, have your blood pressure checked every 3-5 years. If you are 40 years of age or older, have your blood pressure checked every year.  Maintain a healthy weight.  Reducing calorie intake and making food choices that are low in sodium, saturated fat, trans fat, and cholesterol are recommended to manage  weight.  Stop drug abuse.  Avoid taking birth control pills.  Talk to your health care provider about the risks of taking birth control pills if you are over 35 years old, smoke, get migraines, or have ever had a blood clot.  Get evaluated for sleep disorders (sleep apnea).  Talk to your health care provider about getting a sleep evaluation if you snore a lot or have excessive sleepiness.  Take medicines only as directed by your health care provider.  For some people, aspirin or blood thinners (anticoagulants) are helpful in reducing the risk of forming abnormal blood clots that can lead to stroke. If you have the irregular heart rhythm of atrial fibrillation, you should be on a blood thinner unless there is a good reason you cannot take them.  Understand all your medicine instructions.  Make sure that other conditions (such as anemia or atherosclerosis) are addressed. Get help right away if:  You have sudden weakness or numbness of the face, arm, or leg, especially on one side of the body.  Your face or eyelid droops to one side.  You have sudden confusion.  You have trouble speaking (aphasia) or understanding.  You have sudden trouble seeing in one or both eyes.  You have sudden trouble walking.  You have dizziness.  You have a loss of balance or coordination.  You have a sudden, severe headache with no known cause.  You have new chest pain or an irregular heartbeat. Any of these symptoms may represent a serious problem that is an emergency. Do not wait to see if the symptoms will go away.   Get medical help at once. Call your local emergency services (911 in [place] Do not drive yourself to the hospital. This information is not intended to replace advice given to you by your health care provider. Make sure you discuss any questions you have with your health care provider. Document Released: 01/11/2005 Document Revised: 05/11/2016 Document Reviewed: 06/06/2013 Elsevier  Interactive Patient Education  2017 Elsevier Inc.  

## 2017-03-23 ENCOUNTER — Encounter: Payer: Self-pay | Admitting: Cardiology

## 2017-04-09 NOTE — Progress Notes (Signed)
Cardiology Office Note   Date:  04/10/2017   ID:  Cory Bradley, DOB [birth year], MRN [id number]  PCP:  Cory Dials, MD  Cardiologist:  Dr. Katrinka Bradley    Bradley chief complaint on file.     History of Present Illness: Cory Bradley is a 83 y.o. male who presents for his hx of CAD with inf MI with PTCA and then CABG in 2005.  Other hx of tobacco abuse, aortic stenosis DM and HLD< HTN and CKD -2.  Other hx of SVT in 07/2016 broke with adenosine.  Though due to Bradley BB that day and 3 cups of coffee.  Bradley cath since CABG and has been doing well.   Last Echo 09/2016 with EF 60-65%, mild LVH, G1 DD moderate stenosis and mild regurg. LA mod. Dilated.    Today pt has Bradley chest pain and Bradley resting SOB.  With his AS he has not been lightheaded except at times when going from sitting to standing.  This is chronic.  He does have increased DOE with walking up hill though he feels this is due to de conditioning while he was caring for his wife.  She recently died with pancreatic cancer.  He tells me he has lost 30 lbs. And has seen his PCP.  Labs were stable.  He does eat and has access to food people bring him food freq. He does have decreased appetite.  He has now gone back to the gym and is walking on the treadmill and a little SOB.  Recently Remeron was added to meds  Pt also states he is ready to die not that he wants to but if he does that is fine.  Bradley thoughts of suicide.  He wants Bradley prolonged life saving treatment- he wants good quality of life.    Past Medical History:  Diagnosis Date  . Allergic rhinitis   . Aortic stenosis    a. Mild-mod by echo 01/2014.  [name] CKD (chronic kidney disease), stage II   . Coronary artery disease    a. acute inferior MI s/p PTCA of RCA followed by CABGx4 in 2005,  . Diabetes mellitus (HCC)   . ED (erectile dysfunction)   . Hyperlipidemia   . Hypertension   . Left carotid stenosis   . Memory loss   . Osteoarthritis   . Reflux   . Tobacco abuse     Past  Surgical History:  Procedure Laterality Date  . CORONARY ARTERY BYPASS GRAFT       Current Outpatient Prescriptions  Medication Sig Dispense Refill  . aspirin 81 MG tablet Take 81 mg by mouth daily.    [name] atorvastatin (LIPITOR) 80 MG tablet Take 80 mg by mouth daily.    [name] donepezil (ARICEPT) 5 MG tablet Take 5 mg by mouth at bedtime.    . memantine (NAMENDA) 10 MG tablet Take 10 mg by mouth 2 (two) times daily.    . metFORMIN (GLUCOPHAGE) 500 MG tablet TAKE 1 TABLET (500 MG TOTAL) BY MOUTH DAILY.  5  . naproxen sodium (ANAPROX) 550 MG tablet Take 550 mg by mouth 2 (two) times daily as needed for mild pain.     . niacin (NIASPAN) 1000 MG CR tablet Take 1,000 mg by mouth at bedtime.    . Omega-3 Fatty Acids (FISH OIL) 1000 MG CAPS Take 1,000 mg by mouth 2 (two) times daily.    [name] omeprazole (PRILOSEC) 40 MG capsule Take 40 mg by mouth daily.    [name]  propranolol ER (INDERAL LA) 120 MG 24 hr capsule Take 120 mg by mouth daily.    . ramipril (ALTACE) 10 MG capsule Take 10 mg by mouth daily.    . sildenafil (VIAGRA) 100 MG tablet Take 100 mg by mouth daily as needed for erectile dysfunction.    . temazepam (RESTORIL) 15 MG capsule Take 15 mg by mouth at bedtime as needed.      Bradley current facility-administered medications for this visit.     Allergies:   Penicillins and Sulfa antibiotics    Social History:  The patient  reports that he has been smoking.  He started smoking about 19 months ago. He has never used smokeless tobacco. He reports that he does not drink alcohol or use drugs.   Family History:  The patient's family history includes Heart disease in his father and mother.    ROS:  General:Bradley colds or fevers, + weight loss followed by PCP Skin:Bradley rashes or ulcers HEENT:Bradley blurred vision, Bradley congestion CV:see HPI PUL:see HPI GI:Bradley diarrhea constipation or melena, Bradley indigestion GU:Bradley hematuria, Bradley dysuria MS:Bradley joint pain, Bradley claudication Neuro:Bradley syncope, Bradley  lightheadedness Endo:Bradley diabetes, Bradley thyroid disease  Wt Readings from Last 3 Encounters:  04/10/17 171 lb (77.6 kg)  11/14/16 177 lb (80.3 kg)  09/13/16 185 lb 12.8 oz (84.3 kg)     PHYSICAL EXAM: VS:  BP (!) 144/62   Pulse 62   Ht  (1.88 m)   Wt 171 lb (77.6 kg)   BMI 21.96 kg/m  , BMI Body mass index is 21.96 kg/m. General:Pleasant affect, NAD Skin:Warm and dry, brisk capillary refill HEENT:normocephalic, sclera clear, mucus membranes moist Neck:supple, Bradley JVD, Bradley bruits  Heart:S1S2 RRR with 2-3 systolic murmur, gallup, rub or click Lungs:clear without rales, rhonchi, or wheezes ZOX:WRUE, non tender, + BS, do not palpate liver spleen or masses Ext:Bradley lower ext edema, 2+ pedal pulses, 2+ radial pulses Neuro:alert and oriented, MAE, follows commands, + facial symmetry    EKG:  EKG is ordered today. The ekg ordered today demonstrates SR at 15 with PVCs,  Qtc prolonged but more so after pauses.     Recent Labs: 07/27/2016: BUN 15; Creatinine, Ser 1.16; Hemoglobin 12.9; Platelets 164; Potassium 4.1; Sodium 137    Lipid Panel    Component Value Date/Time   CHOL  11/07/2007 0305    88        ATP III CLASSIFICATION:  <200     mg/dL   Desirable  454-098  mg/dL   Borderline High  >=119    mg/dL   High   TRIG 49 14/78/2956 0305   HDL 41 11/07/2007 0305   CHOLHDL 2.1 11/07/2007 0305   VLDL 10 11/07/2007 0305   LDLCALC  11/07/2007 0305    37        Total Cholesterol/HDL:CHD Risk Coronary Heart Disease Risk Table                     Men   Women  1/2 Average Risk   3.4   3.3       Other studies Reviewed: Additional studies/ records that were reviewed today include: . Echo  09/2016 Study Conclusions  - Left ventricle: The cavity size was normal. Wall thickness was   increased in a pattern of mild LVH. Systolic function was normal.   The estimated ejection fraction was in the range of 60% to 65%.   Wall motion was normal; there were Bradley regional  wall motion    abnormalities. Doppler parameters are consistent with abnormal   left ventricular relaxation (grade 1 diastolic dysfunction). - Aortic valve: Mildly calcified annulus. Severely thickened,   severely calcified leaflets. There was moderate stenosis. There   was mild regurgitation. Valve area (VTI): 0.91 cm^2. Valve area   (Vmax): 0.83 cm^2. Valve area (Vmean): 0.85 cm^2. - Left atrium: The atrium was moderately dilated. - Right atrium: The atrium was mildly dilated.   ASSESSMENT AND PLAN:  1.  Aortic stenosis- moderate without symptoms  follow up with Dr. Katrinka Bradley in July.    2. Carotid stenosis L. Followed by Dr. Arbie Cookey   3. CAD with hx of inf MI and PTCA and then CABG in 2005, Bradley angina.  4. Depression with loss of wife to pancreatic cancer.  remeron has been added to meds.    5. HLD followed by PCP  6.  HTN stable.  7. PVCs with normal EF and recent labs with K+ 5.1, BUN 16 and Cr. 1.21 Qtc long with the PVCs.  - remeron and aricept may be cause of prolonged Qtc so will have him stop the remeron.  Will have him come back on Friday for EKG follow up.    Current medicines are reviewed with the patient today.  The patient Has Bradley concerns regarding medicines.  The following changes have been made:  See above Labs/ tests ordered today include:see above  Disposition:   FU:  see above  Signed, Nada Boozer, NP  04/10/2017 9:57 AM    [facility] [address] [place], [place], [place]  [address]/ [address] [place], [place] Phone: [phone]; Fax: [phone]  [phone]

## 2017-04-10 ENCOUNTER — Ambulatory Visit (INDEPENDENT_AMBULATORY_CARE_PROVIDER_SITE_OTHER): Payer: Medicare Other | Admitting: Cardiology

## 2017-04-10 ENCOUNTER — Encounter: Payer: Self-pay | Admitting: Cardiology

## 2017-04-10 ENCOUNTER — Encounter (INDEPENDENT_AMBULATORY_CARE_PROVIDER_SITE_OTHER): Payer: Self-pay

## 2017-04-10 VITALS — BP 144/62 | HR 62 | Ht 74.0 in | Wt 171.0 lb

## 2017-04-10 DIAGNOSIS — E782 Mixed hyperlipidemia: Secondary | ICD-10-CM | POA: Diagnosis not present

## 2017-04-10 DIAGNOSIS — I739 Peripheral vascular disease, unspecified: Secondary | ICD-10-CM

## 2017-04-10 DIAGNOSIS — I10 Essential (primary) hypertension: Secondary | ICD-10-CM | POA: Diagnosis not present

## 2017-04-10 DIAGNOSIS — R9431 Abnormal electrocardiogram [ECG] [EKG]: Secondary | ICD-10-CM | POA: Diagnosis not present

## 2017-04-10 DIAGNOSIS — I493 Ventricular premature depolarization: Secondary | ICD-10-CM | POA: Diagnosis not present

## 2017-04-10 DIAGNOSIS — I779 Disorder of arteries and arterioles, unspecified: Secondary | ICD-10-CM

## 2017-04-10 DIAGNOSIS — I350 Nonrheumatic aortic (valve) stenosis: Secondary | ICD-10-CM | POA: Diagnosis not present

## 2017-04-10 DIAGNOSIS — I2510 Atherosclerotic heart disease of native coronary artery without angina pectoris: Secondary | ICD-10-CM | POA: Diagnosis not present

## 2017-04-10 NOTE — Patient Instructions (Signed)
Continue same medications     Dr Katrinka Blazing appointment Monday 07/09/17 at 1:20 pm.

## 2017-04-11 ENCOUNTER — Telehealth: Payer: Self-pay | Admitting: *Deleted

## 2017-04-11 ENCOUNTER — Telehealth: Payer: Self-pay | Admitting: Cardiology

## 2017-04-11 NOTE — Telephone Encounter (Signed)
Patient returning your call, thanks. °

## 2017-04-11 NOTE — Telephone Encounter (Signed)
I s/w pt as per Nada Boozer, PA she would like for pt to come in Friday 04/13/17 for repeat EKG. Pt is agreeable to plan of care and will come in for appt with Nada Boozer, PA 04/13/17 @ 9:30. Pt thanked me for my call.

## 2017-04-11 NOTE — Telephone Encounter (Signed)
Cory Boozer, PA called this morning and asked if I could help her get in contact with the pt to have him come in Friday 04/13/17 for a repeat EKG based on EKG from yesterday showed prolonged QT. I lmtcb on both pt's home and cell # to call and schedule appt. I have at this time held 3 time slots on Cory Bradley, [place] 04/13/17 schedule until ptcb to let me know which time he can come in.

## 2017-04-12 NOTE — Progress Notes (Signed)
Cardiology Office Note   Date:  04/13/2017   ID:  Cory Bradley, DOB [birth year], MRN [id number]  PCP:  Aura Dials, MD  Cardiologist:  Dr. Katrinka Blazing    Chief Complaint  Patient presents with  . Abnormal ECG    back to rechec Qtc.       History of Present Illness: Cory Bradley is a 83 y.o. male who presents for EKG alone to recheck Qtc that was prolonged on remeron.  He has stopped Remeron and the Qtc is back to baseline. Still with PVCs he is not aware of them.  Will recheck BMET and Mg+.   Otherwise he has no complaints    Past Medical History:  Diagnosis Date  . Allergic rhinitis   . Aortic stenosis    a. Mild-mod by echo 01/2014.  [name] CKD (chronic kidney disease), stage II   . Coronary artery disease    a. acute inferior MI s/p PTCA of RCA followed by CABGx4 in 2005,  . Diabetes mellitus (HCC)   . ED (erectile dysfunction)   . Hyperlipidemia   . Hypertension   . Left carotid stenosis   . Memory loss   . Osteoarthritis   . Reflux   . Tobacco abuse     Past Surgical History:  Procedure Laterality Date  . CORONARY ARTERY BYPASS GRAFT       Current Outpatient Prescriptions  Medication Sig Dispense Refill  . aspirin 81 MG tablet Take 81 mg by mouth daily.    [name] atorvastatin (LIPITOR) 80 MG tablet Take 80 mg by mouth daily.    [name] donepezil (ARICEPT) 5 MG tablet Take 5 mg by mouth at bedtime.    . memantine (NAMENDA) 10 MG tablet Take 10 mg by mouth 2 (two) times daily.    . metFORMIN (GLUCOPHAGE) 500 MG tablet TAKE 1 TABLET (500 MG TOTAL) BY MOUTH DAILY.  5  . naproxen sodium (ANAPROX) 550 MG tablet Take 550 mg by mouth 2 (two) times daily as needed for mild pain.     . niacin (NIASPAN) 1000 MG CR tablet Take 1,000 mg by mouth at bedtime.    . Omega-3 Fatty Acids (FISH OIL) 1000 MG CAPS Take 1,000 mg by mouth 2 (two) times daily.    [name] omeprazole (PRILOSEC) 40 MG capsule Take 40 mg by mouth daily.    . propranolol ER (INDERAL LA) 120 MG 24 hr capsule Take 120 mg  by mouth daily.    . ramipril (ALTACE) 10 MG capsule Take 10 mg by mouth daily.    . sildenafil (VIAGRA) 100 MG tablet Take 100 mg by mouth daily as needed for erectile dysfunction.    . temazepam (RESTORIL) 15 MG capsule Take 15 mg by mouth at bedtime as needed.      No current facility-administered medications for this visit.     Allergies:   Penicillins and Sulfa antibiotics   ROS:  CV:see HPI  Wt Readings from Last 3 Encounters:  04/13/17 170 lb 4 oz (77.2 kg)  04/10/17 171 lb (77.6 kg)  11/14/16 177 lb (80.3 kg)     PHYSICAL EXAM: VS:  BP 128/62   Pulse 60   Ht  (1.88 m)   Wt 170 lb 4 oz (77.2 kg)   BMI 21.86 kg/m  , BMI Body mass index is 21.86 kg/m.   EKG:  EKG is ordered today. The ekg ordered today demonstrates SR with PVCs and Qtc 478    Recent Labs:  07/27/2016: BUN 15; Creatinine, Ser 1.16; Hemoglobin 12.9; Platelets 164; Potassium 4.1; Sodium 137    Lipid Panel    Component Value Date/Time   CHOL  11/07/2007 0305    88        ATP III CLASSIFICATION:  <200     mg/dL   Desirable  161-096  mg/dL   Borderline High  >=045    mg/dL   High   TRIG 49 40/98/1191 0305   HDL 41 11/07/2007 0305   CHOLHDL 2.1 11/07/2007 0305   VLDL 10 11/07/2007 0305   LDLCALC  11/07/2007 0305    37        Total Cholesterol/HDL:CHD Risk Coronary Heart Disease Risk Table                     Men   Women  1/2 Average Risk   3.4   3.3       ASSESSMENT AND PLAN:  1.  QT prolonged now improved off remeron  2. PVCs with K+ of 5.1 on last check will recheck today and Mg+   Current medicines are reviewed with the patient today.  The patient Has no concerns regarding medicines.  The following changes have been made:  See above Labs/ tests ordered today include:see above  Disposition:   FU:  see above  Signed, Nada Boozer, NP  04/13/2017 9:53 AM    [facility] [address] [place], [place], [place]  [address]/ [address]  [place], [place] Phone: [phone]; Fax: [phone]  [phone]

## 2017-04-13 ENCOUNTER — Ambulatory Visit (INDEPENDENT_AMBULATORY_CARE_PROVIDER_SITE_OTHER): Payer: Medicare Other | Admitting: Cardiology

## 2017-04-13 ENCOUNTER — Telehealth: Payer: Self-pay

## 2017-04-13 ENCOUNTER — Encounter (INDEPENDENT_AMBULATORY_CARE_PROVIDER_SITE_OTHER): Payer: Self-pay

## 2017-04-13 ENCOUNTER — Encounter: Payer: Self-pay | Admitting: Cardiology

## 2017-04-13 VITALS — BP 128/62 | HR 60 | Ht 74.0 in | Wt 170.2 lb

## 2017-04-13 DIAGNOSIS — I493 Ventricular premature depolarization: Secondary | ICD-10-CM

## 2017-04-13 DIAGNOSIS — E875 Hyperkalemia: Secondary | ICD-10-CM

## 2017-04-13 DIAGNOSIS — R9431 Abnormal electrocardiogram [ECG] [EKG]: Secondary | ICD-10-CM | POA: Diagnosis not present

## 2017-04-13 LAB — SPECIMEN STATUS

## 2017-04-13 NOTE — Telephone Encounter (Signed)
Patient aware of lab results. Per Nada Boozer NP, Let pt know his potassium is elevated, does he eat a lot of fruit/  Lets hold the altace for now. And recheck in 3-4 weeks BMP. Patient verbalized understanding and will come in on 05/07/17 for repeat lab work.

## 2017-04-13 NOTE — Telephone Encounter (Signed)
-----   Message from Leone Brand, NP sent at 04/13/2017  5:24 PM EDT ----- Let pt know his potassium is elevated, does he eat a lot of fruit/  Lets hold the altace for now. And recheck in 3-4 weeks BMP.

## 2017-04-13 NOTE — Patient Instructions (Signed)
Medication Instructions:  None Ordered   Labwork: Your physician recommends that you return for lab work today BMET, MAGNESIUM  Testing/Procedures: None Ordered   Follow-Up:   Any Other Special Instructions Will Be Listed Below (If Applicable).     If you need a refill on your cardiac medications before your next appointment, please call your pharmacy.

## 2017-04-16 LAB — BASIC METABOLIC PANEL
BUN / CREAT RATIO: 14 (ref 10–24)
BUN: 18 mg/dL (ref 8–27)
CHLORIDE: 103 mmol/L (ref 96–106)
CO2: 25 mmol/L (ref 18–29)
Calcium: 9.1 mg/dL (ref 8.6–10.2)
Creatinine, Ser: 1.25 mg/dL (ref 0.76–1.27)
GFR calc Af Amer: 62 mL/min/{1.73_m2} (ref >59)
GFR calc non Af Amer: 54 mL/min/{1.73_m2} — ABNORMAL LOW (ref >59)
Glucose: 102 mg/dL — ABNORMAL HIGH (ref 65–99)
POTASSIUM: 5.5 mmol/L — AB (ref 3.5–5.2)
SODIUM: 141 mmol/L (ref 134–144)

## 2017-04-16 LAB — MAGNESIUM: MAGNESIUM: 1.7 mg/dL (ref 1.6–2.3)

## 2017-04-26 NOTE — Addendum Note (Signed)
Addended by: Etheleen MayhewOX, Zacharey Jensen C on: 04/26/2017 01:33 PM   Modules accepted: Orders

## 2017-05-07 ENCOUNTER — Other Ambulatory Visit: Payer: Medicare Other

## 2017-05-07 DIAGNOSIS — E875 Hyperkalemia: Secondary | ICD-10-CM

## 2017-05-07 LAB — BASIC METABOLIC PANEL
BUN/Creatinine Ratio: 16 (ref 10–24)
BUN: 21 mg/dL (ref 8–27)
CALCIUM: 8.6 mg/dL (ref 8.6–10.2)
CO2: 23 mmol/L (ref 18–29)
Chloride: 106 mmol/L (ref 96–106)
Creatinine, Ser: 1.29 mg/dL — ABNORMAL HIGH (ref 0.76–1.27)
GFR calc Af Amer: 60 mL/min/{1.73_m2} (ref >59)
GFR, EST NON AFRICAN AMERICAN: 52 mL/min/{1.73_m2} — AB (ref >59)
Glucose: 124 mg/dL — ABNORMAL HIGH (ref 65–99)
POTASSIUM: 4.5 mmol/L (ref 3.5–5.2)
Sodium: 141 mmol/L (ref 134–144)

## 2017-05-15 ENCOUNTER — Encounter: Payer: Self-pay | Admitting: *Deleted

## 2017-05-16 ENCOUNTER — Encounter: Payer: Self-pay | Admitting: Physician Assistant

## 2017-05-16 ENCOUNTER — Telehealth: Payer: Self-pay

## 2017-05-16 ENCOUNTER — Encounter: Payer: Self-pay | Admitting: Family

## 2017-05-16 ENCOUNTER — Ambulatory Visit (INDEPENDENT_AMBULATORY_CARE_PROVIDER_SITE_OTHER): Payer: Medicare Other | Admitting: Family

## 2017-05-16 ENCOUNTER — Ambulatory Visit (INDEPENDENT_AMBULATORY_CARE_PROVIDER_SITE_OTHER): Payer: Medicare Other | Admitting: Physician Assistant

## 2017-05-16 ENCOUNTER — Ambulatory Visit (HOSPITAL_COMMUNITY)
Admission: RE | Admit: 2017-05-16 | Discharge: 2017-05-16 | Disposition: A | Discharge status: Home / Self Care | Payer: Medicare Other | Source: Ambulatory Visit | Attending: Family | Admitting: Family

## 2017-05-16 VITALS — BP 198/85 | HR 63 | Ht 74.0 in | Wt 172.1 lb

## 2017-05-16 VITALS — BP 190/80 | HR 64 | Temp 97.0°F | Resp 18 | Ht 74.0 in | Wt 173.0 lb

## 2017-05-16 DIAGNOSIS — I10 Essential (primary) hypertension: Secondary | ICD-10-CM

## 2017-05-16 DIAGNOSIS — R0602 Shortness of breath: Secondary | ICD-10-CM

## 2017-05-16 DIAGNOSIS — I6523 Occlusion and stenosis of bilateral carotid arteries: Secondary | ICD-10-CM | POA: Insufficient documentation

## 2017-05-16 DIAGNOSIS — I350 Nonrheumatic aortic (valve) stenosis: Secondary | ICD-10-CM

## 2017-05-16 DIAGNOSIS — I499 Cardiac arrhythmia, unspecified: Secondary | ICD-10-CM | POA: Diagnosis not present

## 2017-05-16 DIAGNOSIS — I471 Supraventricular tachycardia: Secondary | ICD-10-CM | POA: Insufficient documentation

## 2017-05-16 DIAGNOSIS — I493 Ventricular premature depolarization: Secondary | ICD-10-CM

## 2017-05-16 DIAGNOSIS — E118 Type 2 diabetes mellitus with unspecified complications: Secondary | ICD-10-CM

## 2017-05-16 DIAGNOSIS — I739 Peripheral vascular disease, unspecified: Secondary | ICD-10-CM

## 2017-05-16 DIAGNOSIS — E782 Mixed hyperlipidemia: Secondary | ICD-10-CM | POA: Diagnosis not present

## 2017-05-16 DIAGNOSIS — I2510 Atherosclerotic heart disease of native coronary artery without angina pectoris: Secondary | ICD-10-CM | POA: Diagnosis not present

## 2017-05-16 DIAGNOSIS — N183 Chronic kidney disease, stage 3 unspecified: Secondary | ICD-10-CM

## 2017-05-16 DIAGNOSIS — I779 Disorder of arteries and arterioles, unspecified: Secondary | ICD-10-CM | POA: Insufficient documentation

## 2017-05-16 MED ORDER — ASPIRIN EC 81 MG PO TBEC: 81.0000 mg | DELAYED_RELEASE_TABLET | Freq: Every day | ORAL | Status: AC

## 2017-05-16 NOTE — Patient Instructions (Signed)
Stroke Prevention Some medical conditions and behaviors are associated with an increased chance of having a stroke. You may prevent a stroke by making healthy choices and managing medical conditions. How can I reduce my risk of having a stroke?  Stay physically active. Get at least 30 minutes of activity on most or all days.  Do not smoke. It may also be helpful to avoid exposure to secondhand smoke.  Limit alcohol use. Moderate alcohol use is considered to be:  No more than 2 drinks per day for men.  No more than 1 drink per day for nonpregnant women.  Eat healthy foods. This involves:  Eating 5 or more servings of fruits and vegetables a day.  Making dietary changes that address high blood pressure (hypertension), high cholesterol, diabetes, or obesity.  Manage your cholesterol levels.  Making food choices that are high in fiber and low in saturated fat, trans fat, and cholesterol may control cholesterol levels.  Take any prescribed medicines to control cholesterol as directed by your health care provider.  Manage your diabetes.  Controlling your carbohydrate and sugar intake is recommended to manage diabetes.  Take any prescribed medicines to control diabetes as directed by your health care provider.  Control your hypertension.  Making food choices that are low in salt (sodium), saturated fat, trans fat, and cholesterol is recommended to manage hypertension.  Ask your health care provider if you need treatment to lower your blood pressure. Take any prescribed medicines to control hypertension as directed by your health care provider.  If you are 18-39 years of age, have your blood pressure checked every 3-5 years. If you are 40 years of age or older, have your blood pressure checked every year.  Maintain a healthy weight.  Reducing calorie intake and making food choices that are low in sodium, saturated fat, trans fat, and cholesterol are recommended to manage  weight.  Stop drug abuse.  Avoid taking birth control pills.  Talk to your health care provider about the risks of taking birth control pills if you are over 35 years old, smoke, get migraines, or have ever had a blood clot.  Get evaluated for sleep disorders (sleep apnea).  Talk to your health care provider about getting a sleep evaluation if you snore a lot or have excessive sleepiness.  Take medicines only as directed by your health care provider.  For some people, aspirin or blood thinners (anticoagulants) are helpful in reducing the risk of forming abnormal blood clots that can lead to stroke. If you have the irregular heart rhythm of atrial fibrillation, you should be on a blood thinner unless there is a good reason you cannot take them.  Understand all your medicine instructions.  Make sure that other conditions (such as anemia or atherosclerosis) are addressed. Get help right away if:  You have sudden weakness or numbness of the face, arm, or leg, especially on one side of the body.  Your face or eyelid droops to one side.  You have sudden confusion.  You have trouble speaking (aphasia) or understanding.  You have sudden trouble seeing in one or both eyes.  You have sudden trouble walking.  You have dizziness.  You have a loss of balance or coordination.  You have a sudden, severe headache with no known cause.  You have new chest pain or an irregular heartbeat. Any of these symptoms may represent a serious problem that is an emergency. Do not wait to see if the symptoms will go away.   Get medical help at once. Call your local emergency services (911 in [place] Do not drive yourself to the hospital. This information is not intended to replace advice given to you by your health care provider. Make sure you discuss any questions you have with your health care provider. Document Released: 01/11/2005 Document Revised: 05/11/2016 Document Reviewed: 06/06/2013 Elsevier  Interactive Patient Education  2017 Elsevier Inc.  

## 2017-05-16 NOTE — Progress Notes (Signed)
Cardiology Office Note:    Date:  05/16/2017   ID:  Cory Bradley, DOB 11/02/1933, MRN [id number]  PCP:  Tracey HarriesBouska, David, MD  Cardiologist:  Dr. Verdis PrimeHenry Smith   [facility]: Dr. Arbie CookeyEarly  Referring MD: Tracey HarriesBouska, David, MD   Chief Complaint  Patient presents with  . Shortness of Breath    History of Present Illness:    Cory Bradley is a 83 y.o. male with a hx of CAD status post inferior STEMI in 2005 treated with angioplasty to the RCA followed by CABG 4, aortic stenosis, PSVT (tx with adenosine in the ED in 07/2016), carotid artery disease, diabetes, HTN, HL, CKD, tobacco abuse. He was seen by Nada BoozerLaura Ingold, NP in 03/2017 for follow up.  His QTc was prolonged on Remeron which was new.  This was DC'd with improved QTc.  He was at [facility] today for follow up on his carotid disease.  He was noted to have a high BP (190/80) and he was asked to follow up Cardiology.  He walked into our clinic to be seen today.   Cory Bradley is here alone today.  He has been feeling quite fatigued for over a month now.  He notes dyspnea on exertion with minimal activity.  He has to stop taking his trash down to the curb.  He also gets short of breath walking to his bathroom from his bedroom.  He denies chest pain, orthopnea, PND, edema.  He denies syncope. He gets lightheaded if he stands quickly. He notes a long hx of "white coat HTN."  His BP at home is usually 120s/80s.  His BP usually runs high in the office.  Recently, his BP was fairly normal in our office.    Prior CV studies:   The following studies were reviewed today:  Carotid US 10/2016 R 1-39; L 40-59  Echo 09/2016 Mild LVH, EF 60-65, normal wall motion, grade 1 diastolic dysfunction, moderate aortic stenosis (mean 32, peak 52), mild AI, moderate LAE, mild RAE  Myoview 10/2007 No ischemia, diaphragmatic attenuation, EF 63  Cardiac Catheterization 12/2003 EF 50, inferior HK LM distal 40-50 LAD 60, 75 RI proximal 70-80 LCx ostial 75-85, mid 95, distal  occluded RCA proximal 95, mid occluded PCI: POBA to the proximal and mid RCA  Past Medical History:  Diagnosis Date  . Allergic rhinitis   . Aortic stenosis    a. Mild-mod by echo 01/2014.  [name]. CKD (chronic kidney disease), stage II   . Coronary artery disease    a. acute inferior MI s/p PTCA of RCA followed by CABGx4 in 2005,  . Diabetes mellitus (HCC)   . ED (erectile dysfunction)   . Hyperlipidemia   . Hypertension   . Irregular heartbeat 10/2016  . Left carotid stenosis   . Memory loss   . Osteoarthritis   . Reflux   . Tobacco abuse     Past Surgical History:  Procedure Laterality Date  . CORONARY ARTERY BYPASS GRAFT      Current Medications: Current Meds  Medication Sig  . atorvastatin (LIPITOR) 80 MG tablet Take 80 mg by mouth daily.  [name]. donepezil (ARICEPT) 5 MG tablet Take 5 mg by mouth at bedtime.  . memantine (NAMENDA) 10 MG tablet Take 10 mg by mouth 2 (two) times daily.  . metFORMIN (GLUCOPHAGE) 500 MG tablet TAKE 1 TABLET (500 MG TOTAL) BY MOUTH DAILY.  . naproxen sodium (ANAPROX) 550 MG tablet Take 550 mg by mouth 2 (two) times daily as needed  for mild pain.   . niacin (NIASPAN) 1000 MG CR tablet Take 1,000 mg by mouth at bedtime.  . Omega-3 Fatty Acids (FISH OIL) 1000 MG CAPS Take 1,000 mg by mouth 2 (two) times daily.  [name] omeprazole (PRILOSEC) 40 MG capsule Take 40 mg by mouth daily.  . propranolol ER (INDERAL LA) 120 MG 24 hr capsule Take 120 mg by mouth daily.  . ramipril (ALTACE) 10 MG capsule Take 10 mg by mouth daily.  . sildenafil (VIAGRA) 100 MG tablet Take 100 mg by mouth daily as needed for erectile dysfunction.  . temazepam (RESTORIL) 15 MG capsule Take 15 mg by mouth at bedtime as needed.   . [DISCONTINUED] aspirin 325 MG tablet Take 325 mg by mouth daily.     Allergies:   Penicillins and Sulfa antibiotics   Social History   Social History  . Marital status: Married    Spouse name: N/A  . Number of children: N/A  . Years of education: N/A    Social History Main Topics  . Smoking status: Current Every Day Smoker    Packs/day: 0.50    Years: 2.00    Types: Cigarettes    Start date: 08/21/2015  . Smokeless tobacco: Never Used  . Alcohol use No  . Drug use: No  . Sexual activity: Not Asked   Other Topics Concern  . None   Social History Narrative  . None     Family Hx: The patient's family history includes Heart disease in his father.  ROS:   Please see the history of present illness.    ROS All other systems reviewed and are negative.   EKGs/Labs/Other Test Reviewed:    EKG:  EKG is  ordered today.  The ekg ordered today demonstrates NSR, HR 63, leftward axis, QTc 499 ms, frequent PVCs  Recent Labs: 07/27/2016: Hemoglobin 12.9 04/13/2017: Hemoglobin WILL FOLLOW; Magnesium 1.7; Platelets WILL FOLLOW 05/07/2017: BUN 21; Creatinine, Ser 1.29; Potassium 4.5; Sodium 141   Recent Lipid Panel    Component Value Date/Time   CHOL  11/07/2007 0305    88        ATP III CLASSIFICATION:  <200     mg/dL   Desirable  960-454  mg/dL   Borderline High  >=098    mg/dL   High   TRIG 49 11/91/4782 0305   HDL 41 11/07/2007 0305   CHOLHDL 2.1 11/07/2007 0305   VLDL 10 11/07/2007 0305   LDLCALC  11/07/2007 0305    37        Total Cholesterol/HDL:CHD Risk Coronary Heart Disease Risk Table                     Men   Women  1/2 Average Risk   3.4   3.3     Physical Exam:    VS:  BP (!) 198/85 (BP Location: Right Arm, Cuff Size: Normal)   Pulse 63   Ht 6\' 2"  (1.88 m)   Wt 172 lb 1.9 oz (78.1 kg)   BMI 22.10 kg/m     Wt Readings from Last 3 Encounters:  05/16/17 172 lb 1.9 oz (78.1 kg)  05/16/17 173 lb (78.5 kg)  04/13/17 170 lb 4 oz (77.2 kg)     Physical Exam  Constitutional: He is oriented to person, place, and time. He appears well-developed and well-nourished. No distress.  HENT:  Head: Normocephalic and atraumatic.  Eyes: No scleral icterus.  Neck: Normal range of motion. No  JVD present.   Cardiovascular: Normal rate, regular rhythm, S1 normal and S2 normal.   Murmur heard.  Harsh crescendo-decrescendo systolic murmur is present with a grade of 3/6  at the upper right sternal border Pulmonary/Chest: Effort normal and breath sounds normal. He has no wheezes. He has no rhonchi. He has no rales.  Abdominal: Soft. There is no tenderness.  Musculoskeletal: He exhibits no edema.  Neurological: He is alert and oriented to person, place, and time.  Skin: Skin is warm and dry.  Psychiatric: He has a normal mood and affect.    ASSESSMENT:    1. Shortness of breath   2. Nonrheumatic aortic valve stenosis   3. Coronary artery disease involving native coronary artery of native heart without angina pectoris   4. Essential hypertension   5. PVC's (premature ventricular contractions)   6. Mixed hyperlipidemia   7. Bilateral carotid artery disease (HCC)   8. CKD (chronic kidney disease) stage 3, GFR 30-59 ml/min   9. Type 2 diabetes mellitus with complication, without long-term current use of insulin (HCC)    PLAN:    In order of problems listed above:  1. Shortness of breath - He notes progressively worsening shortness of breath over the past 1 month.  He has a lot of ectopy on his ECG.  He denies chest pain.  His aortic valve gradients were in the high moderate range in 09/2016.  I am concerned that his symptoms are related to worsening aortic stenosis.  His bypass grafts are also 81 years old.  It has been several years since his last nuclear stress test.  I have recommended proceeding with a Cardiac Catheterization to further evaluate.  I d/w Dr. Everette Rank (DOD) who agreed.  Risks and benefits of cardiac catheterization have been discussed with the patient.  These include bleeding, infection, kidney damage, stroke, heart attack, death.  The patient understands these risks and is willing to proceed.   -  Schedule an echocardiogram to re-evaluate his aortic stenosis and LVF  -   Schedule Cardiac Catheterization (R and L heart cath)  -  BMET, CBC, Mg2+, INR today  2. Nonrheumatic aortic valve stenosis -  I am concerned his aortic stenosis is worse.  Proceed with Cardiac Catheterization and Echo as noted.  If aortic stenosis is severe now, plan referral to Dr. Tonny Bollman or Dr. Verne Carrow to evaluate for TAVR.  3. Coronary artery disease involving native coronary artery of native heart without angina pectoris -  S/p CABG 2005.  Myoview in 2008 was low risk.  Recent dyspnea on exertion is concerning for severe aortic stenosis vs progressive CAD.  Proceed with echocardiogram and Cardiac Catheterization as noted.  Continue ASA (change to 81 mg QD).  Continue statin, beta-blocker.    4. Essential hypertension -  BP tends to run very high in the office.  I am concerned about increasing his blood pressure medications if his aortic stenosis has really worsened.  I have asked him to monitor BP at home and send readings after 1 week.   5. PVC's (premature ventricular contractions) -  Frequent ventricular ectopy has persisted over the past few weeks.  Obtain BMET, Mg2+ today.   6. Mixed hyperlipidemia - Continue statin.    7. Bilateral carotid artery disease (HCC) - FU with [facility] as planned.  Continue ASA, statin.   8. CKD (chronic kidney disease) stage 3, GFR 30-59 ml/min -  His Creatinine should be ok for Cardiac Catheterization.  Will  make sure he is hydrated well.  9. Diabetes - Hold Metformin 24 hours before and 48 hours after cardiac cath.    Dispo:  Return in about 2 weeks (around 05/30/2017) for Follow up after testing with Dr. Katrinka Blazing or Tereso Newcomer, PA-C .   Medication Adjustments/Labs and Tests Ordered: Current medicines are reviewed at length with the patient today.  Concerns regarding medicines are outlined above.  Orders/Tests:  Orders Placed This Encounter  Procedures  . Basic Metabolic Panel (BMET)  . Magnesium  . CBC w/Diff  . INR/PT  .  EKG 12-Lead  . ECHOCARDIOGRAM COMPLETE   Medication changes: Meds ordered this encounter  Medications  . aspirin EC 81 MG tablet    Sig: Take 1 tablet (81 mg total) by mouth daily.   Signed, Tereso Newcomer, PA-C  05/16/2017 4:49 PM    [facility] [address] [place], [place], [place]  [address] Phone: [phone]; Fax: [phone]   I have reviewed assessment and plan.  Agree with above as stated.    Lance Muss

## 2017-05-16 NOTE — Patient Instructions (Addendum)
Medication Instructions:  Your physician has recommended you make the following change in your medication:  1. Start baby Aspirin 81 mg daily.   Labwork: Today: BMET, CBC, PT/INR, Magnesium  Testing/Procedures: Your physician has requested that you have a cardiac catheterization. Cardiac catheterization is used to diagnose and/or treat various heart conditions. Doctors may recommend this procedure for a number of different reasons. The most common reason is to evaluate chest pain. Chest pain can be a symptom of coronary artery disease (CAD), and cardiac catheterization can show whether plaque is narrowing or blocking your heart's arteries. This procedure is also used to evaluate the valves, as well as measure the blood flow and oxygen levels in different parts of your heart. For further information please visit [email or web address] Please follow instruction sheet, as given.   Your physician has requested that you have an echocardiogram. Echocardiography is a painless test that uses sound waves to create images of your heart. It provides your doctor with information about the size and shape of your heart and how well your heart's chambers and valves are working. This procedure takes approximately one hour. There are no restrictions for this procedure. Scheduled for 05/25/2017 at 3 pm.    Follow-Up: Appointment scheduled with Tereso Newcomer on June 18, 2017 at 10:15 am same day Dr. Katrinka Blazing is in the office.   Any Other Special Instructions Will Be Listed Below (If Applicable).   Check BP every day for 1 week and then call our office to let Tereso Newcomer, PA know of BP readings.   If you need a refill on your cardiac medications before your next appointment, please call your pharmacy.   [facility] CARDIOVASCULAR DIVISION [facility] ST OFFICE [address] [place] [address] Dept: [phone] Loc: [phone]  Cory Bradley  05/16/2017  You are scheduled for a Cardiac Catheterization on Monday, June 11 with Dr. Verdis Prime.  1. Please arrive at the [facility] (Main Entrance A) at [facility]: [address] [place], [place] [address] at 10:00 AM (two hours before your procedure to ensure your preparation). Free valet parking service is available.   Special note: Every effort is made to have your procedure done on time. Please understand that emergencies sometimes delay scheduled procedures.  2. Diet: Do not eat or drink anything after midnight prior to your procedure except sips of water to take medications.  3. Labs: You will need to have blood drawn on Wednesday, May 30 at [facility]. [address], [place]  Open: 7:30am - 5pm    Phone: [phone]. You do not need to be fasting.  4. Medication instructions in preparation for your procedure  Stop taking Metformin 24 hours before cath, the day of the procedure, and 48 hours after.   On the morning of your procedure, take your Aspirin 81 mg  and any morning medicines NOT listed above.  You may use sips of water.  5. Plan for one night stay--bring personal belongings. 6. Bring a current list of your medications and current insurance cards. 7. You MUST have a responsible person to drive you home. 8. Someone MUST be with you the first 24 hours after you arrive home or your discharge will be delayed. 9. Please wear clothes that are easy to get on and off and wear slip-on shoes.  Thank you for allowing [place] to care for you!   -- [facility]

## 2017-05-16 NOTE — Telephone Encounter (Signed)
Called patient after he left the office and informed him to check his BP for 1 week and to call our office back to let Tereso NewcomerScott Weaver, PA know the BP recordings per order. Pt verbalized understanding.

## 2017-05-16 NOTE — Progress Notes (Signed)
Chief Complaint: Follow up Extracranial Carotid Artery Stenosis   History of Present Illness  Cory Bradley is a 83 y.o. male patient that Dr. Arbie Cookey and Lianne Cure, PA-C,  evaluated initially on 04-11-16, referred by Ronie Spies PA-C, for evaluation of carotid stenosis. He returns today for follow up. He denies any known history of stroke or TIA. Specifically he deniesa history of amaurosis fugax or monocular blindness, unilateral facial drooping, hemiplegia, orreceptive or expressive aphasia.    The patient is currently onantiplatelet therapy in the form of 325 mg aspirin daily. The carotid stenosis was found due to audible bruit on the left and a carotid duplex was performed. Other medical problems include CAD s/p CABG x 4, hyperlipidemia managed with Lipitor, DM managed with metformin, and hypertension managed with propanolol.   He reports that he has a hx of sciatica that no longer bothers him.  He denies claudication sx's with walking.        Pt Diabetic: yes Pt smoker: smoker  (1/3 ppd, started in 2016 after he found that his wife was in the last stages of pancreatic cancer, she passed away in 2017/03/22, states he had also smoked in the past)  Pt meds include: Statin : yes ASA: yes Other anticoagulants/antiplatelets: no    Past Medical History:  Diagnosis Date  . Allergic rhinitis   . Aortic stenosis    a. Mild-mod by echo 01/2014.  [name] CKD (chronic kidney disease), stage II   . Coronary artery disease    a. acute inferior MI s/p PTCA of RCA followed by CABGx4 in 2005,  . Diabetes mellitus (HCC)   . ED (erectile dysfunction)   . Hyperlipidemia   . Hypertension   . Irregular heartbeat 10/2016  . Left carotid stenosis   . Memory loss   . Osteoarthritis   . Reflux   . Tobacco abuse     Social History Social History  Substance Use Topics  . Smoking status: Current Every Day Smoker    Packs/day: 0.50    Years: 2.00    Types: Cigarettes    Start  date: 08/21/2015  . Smokeless tobacco: Never Used  . Alcohol use No    Family History Family History  Problem Relation Age of Onset  . Heart disease Father     Surgical History Past Surgical History:  Procedure Laterality Date  . CORONARY ARTERY BYPASS GRAFT      Allergies  Allergen Reactions  . Penicillins     REACTION: feet and hands swell  . Sulfa Antibiotics     Possible allergy    Current Outpatient Prescriptions  Medication Sig Dispense Refill  . aspirin 325 MG tablet Take 325 mg by mouth daily.    [name] atorvastatin (LIPITOR) 80 MG tablet Take 80 mg by mouth daily.    [name] donepezil (ARICEPT) 5 MG tablet Take 5 mg by mouth at bedtime.    . memantine (NAMENDA) 10 MG tablet Take 10 mg by mouth 2 (two) times daily.    . metFORMIN (GLUCOPHAGE) 500 MG tablet TAKE 1 TABLET (500 MG TOTAL) BY MOUTH DAILY.  5  . naproxen sodium (ANAPROX) 550 MG tablet Take 550 mg by mouth 2 (two) times daily as needed for mild pain.     . niacin (NIASPAN) 1000 MG CR tablet Take 1,000 mg by mouth at bedtime.    . Omega-3 Fatty Acids (FISH OIL) 1000 MG CAPS Take 1,000 mg by mouth 2 (two) times daily.    [name]  omeprazole (PRILOSEC) 40 MG capsule Take 40 mg by mouth daily.    . propranolol ER (INDERAL LA) 120 MG 24 hr capsule Take 120 mg by mouth daily.    . ramipril (ALTACE) 10 MG capsule Take 10 mg by mouth daily.    . temazepam (RESTORIL) 15 MG capsule Take 15 mg by mouth at bedtime as needed.     [name] aspirin 81 MG tablet Take 81 mg by mouth daily.    . sildenafil (VIAGRA) 100 MG tablet Take 100 mg by mouth daily as needed for erectile dysfunction.     No current facility-administered medications for this visit.     Review of Systems : See HPI for pertinent positives and negatives.  Physical Examination  Vitals:   05/16/17 1201 05/16/17 1237  BP: (!) 198/85 (!) 190/80  Pulse: 64   Resp: 18   Temp: 97 F (36.1 C)   TempSrc: Oral   SpO2: 98%   Weight: 173 lb (78.5 kg)   Height: 6\' 2"  (1.88 m)     Body mass index is 22.21 kg/m.  General:WDWN thin male in NAD, somewhat weepy when speaking of his wife's recent death after a long illness  GAIT: normal Eyes: PERRLA Pulmonary:  Respirations are non-labored, fair air movement, CTAB  Cardiac: Irregular rhythm, + murmur.  VASCULAR EXAM Carotid Bruits Right Left   Negative Positive    Aorta is not palpable. Radial pulses are 2+ palpable and equal.                                                                                                                                          LE Pulses Right Left       POPLITEAL  not palpable   not palpable       POSTERIOR TIBIAL   palpable    palpable        DORSALIS PEDIS      ANTERIOR TIBIAL not palpable  not palpable     Gastrointestinal: soft, nontender, BS WNL, no r/g, no palpable masses.  Musculoskeletal: No muscle atrophy/wasting. M/S 5/5 throughout, extremities without ischemic changes. Mild-moderate kyphosis.   Neurologic: A&O X 3; Appropriate Affect, Speech is normal CN 2-12 intact, pain and light touch intact in extremities, Motor exam as listed above     Assessment: Cory Bradley is a 83 y.o. male who has a left carotid bruit, no history of stroke or TIA.  His DM seems to be well controlled on metformin as his only DM agent, he has lost 30 pounds since his wife has been ill and passed away. He has resumed smoking since his wife was diagnosed with advanced pancreatic cancer and passed away in 03-11-17, states he has no intent to quit.   He is very hypertensive now, denies chest pain or dyspnea, denies headache or dizziness, see Plan.  His cardiac rhythm is  irregular; see Plan. He takes a daily 325 mg ASA and statin, he takes no anticoagulant.   DATA (05/16/17): Carotid Duplex: Right ICA with <40% stenosis, left proximal ICA with 40-59% stenosis. Bilateral vertebral artery flow is antegrade. Bilateral subclavian artery waveforms are normal. No  significant change compared to the last exam on 11-14-16.   Carotid duplex 09/29/2015 from another facility: Right ICA: <40% Left ICA: 40-59%   Plan: I advised pt to go to his cardiologist office now and ask for guidance to address his uncontrolled hypertension, irregular cardiac rhythm, and 1 month hx of feeling dyspneic on exertion and tired.   The patient was counseled re smoking cessation and given several free resources re smoking cessation.  Follow-up in 1 year with Carotid Duplex scan.   I discussed in depth with the patient the nature of atherosclerosis, and emphasized the importance of maximal medical management including strict control of blood pressure, blood glucose, and lipid levels, obtaining regular exercise, and cessation of smoking.  The patient is aware that without maximal medical management the underlying atherosclerotic disease process will progress, limiting the benefit of any interventions. The patient was given information about stroke prevention and what symptoms should prompt the patient to seek immediate medical care. Thank you for allowing us to participate in this patient's care.  Charisse MarchSuzanne Sahar Ryback, RN, MSN, FNP-C [facility] Office: [phone]  Clinic Physician: Early/Dickson  05/16/17 12:41 PM

## 2017-05-17 ENCOUNTER — Telehealth: Payer: Self-pay | Admitting: *Deleted

## 2017-05-17 LAB — BASIC METABOLIC PANEL
BUN/Creatinine Ratio: 16 (ref 10–24)
BUN: 18 mg/dL (ref 8–27)
CALCIUM: 9 mg/dL (ref 8.6–10.2)
CHLORIDE: 106 mmol/L (ref 96–106)
CO2: 23 mmol/L (ref 18–29)
Creatinine, Ser: 1.1 mg/dL (ref 0.76–1.27)
GFR calc Af Amer: 72 mL/min/{1.73_m2} (ref >59)
GFR calc non Af Amer: 63 mL/min/{1.73_m2} (ref >59)
GLUCOSE: 102 mg/dL — AB (ref 65–99)
Potassium: 4.6 mmol/L (ref 3.5–5.2)
Sodium: 143 mmol/L (ref 134–144)

## 2017-05-17 LAB — PROTIME-INR
INR: 1 (ref 0.8–1.2)
Prothrombin Time: 10.9 s (ref 9.1–12.0)

## 2017-05-17 LAB — CBC WITH DIFFERENTIAL/PLATELET
BASOS ABS: 0.1 10*3/uL (ref 0.0–0.2)
Basos: 1 %
EOS (ABSOLUTE): 0.2 10*3/uL (ref 0.0–0.4)
Eos: 3 %
Hematocrit: 39.9 % (ref 37.5–51.0)
Hemoglobin: 13.2 g/dL (ref 13.0–17.7)
IMMATURE GRANS (ABS): 0 10*3/uL (ref 0.0–0.1)
IMMATURE GRANULOCYTES: 0 %
Lymphocytes Absolute: 1.5 10*3/uL (ref 0.7–3.1)
Lymphs: 19 %
MCH: 30 pg (ref 26.6–33.0)
MCHC: 33.1 g/dL (ref 31.5–35.7)
MCV: 91 fL (ref 79–97)
Monocytes Absolute: 0.6 10*3/uL (ref 0.1–0.9)
Monocytes: 8 %
NEUTROS PCT: 69 %
Neutrophils Absolute: 5.4 10*3/uL (ref 1.4–7.0)
Platelets: 212 10*3/uL (ref 150–379)
RBC: 4.4 x10E6/uL (ref 4.14–5.80)
RDW: 13.8 % (ref 12.3–15.4)
WBC: 7.8 10*3/uL (ref 3.4–10.8)

## 2017-05-17 LAB — MAGNESIUM: MAGNESIUM: 1.9 mg/dL (ref 1.6–2.3)

## 2017-05-17 NOTE — Telephone Encounter (Signed)
-----   Message from Beatrice LecherScott T Weaver, [place]PA-C sent at 05/17/2017  8:48 AM EDT ----- Please call the patient Kidney function is normal. The hemoglobin and INR/Prothrombin Time are normal. Labs ok for Cardiac Catheterization  Continue with current treatment plan. Tereso NewcomerScott Weaver, PA-C   05/17/2017 8:48 AM

## 2017-05-17 NOTE — Telephone Encounter (Signed)
Pt has been notified of lab results and is ok for his procedure. Pt thanked me for my call today.

## 2017-05-25 ENCOUNTER — Other Ambulatory Visit: Payer: Self-pay

## 2017-05-25 ENCOUNTER — Ambulatory Visit (HOSPITAL_COMMUNITY): Payer: Medicare Other | Attending: Cardiovascular Disease

## 2017-05-25 DIAGNOSIS — I350 Nonrheumatic aortic (valve) stenosis: Secondary | ICD-10-CM | POA: Diagnosis not present

## 2017-05-25 NOTE — Addendum Note (Signed)
Addended by: Burton ApleyPETTY, Kenisha Lynds A on: 05/25/2017 01:53 PM   Modules accepted: Orders

## 2017-05-25 NOTE — Progress Notes (Signed)
Pt to arrive for ECHO today @ 4 pm.  Printed copy of cath instructions and gave to ECHO operator to give to Pt.

## 2017-05-26 DIAGNOSIS — R0609 Other forms of dyspnea: Secondary | ICD-10-CM

## 2017-05-28 ENCOUNTER — Ambulatory Visit (HOSPITAL_COMMUNITY)
Admission: RE | Admit: 2017-05-28 | Discharge: 2017-05-28 | Disposition: A | Discharge status: Home / Self Care | Payer: Medicare Other | Source: Ambulatory Visit | Attending: Interventional Cardiology | Admitting: Interventional Cardiology

## 2017-05-28 ENCOUNTER — Encounter: Payer: Self-pay | Admitting: Physician Assistant

## 2017-05-28 ENCOUNTER — Encounter (HOSPITAL_COMMUNITY)
Admission: RE | Disposition: A | Discharge status: Home / Self Care | Payer: Self-pay | Source: Ambulatory Visit | Attending: Interventional Cardiology

## 2017-05-28 DIAGNOSIS — N529 Male erectile dysfunction, unspecified: Secondary | ICD-10-CM | POA: Diagnosis not present

## 2017-05-28 DIAGNOSIS — I350 Nonrheumatic aortic (valve) stenosis: Secondary | ICD-10-CM | POA: Insufficient documentation

## 2017-05-28 DIAGNOSIS — I2510 Atherosclerotic heart disease of native coronary artery without angina pectoris: Secondary | ICD-10-CM

## 2017-05-28 DIAGNOSIS — Z79899 Other long term (current) drug therapy: Secondary | ICD-10-CM | POA: Diagnosis not present

## 2017-05-28 DIAGNOSIS — I252 Old myocardial infarction: Secondary | ICD-10-CM | POA: Insufficient documentation

## 2017-05-28 DIAGNOSIS — K219 Gastro-esophageal reflux disease without esophagitis: Secondary | ICD-10-CM | POA: Insufficient documentation

## 2017-05-28 DIAGNOSIS — I25810 Atherosclerosis of coronary artery bypass graft(s) without angina pectoris: Secondary | ICD-10-CM | POA: Diagnosis not present

## 2017-05-28 DIAGNOSIS — E782 Mixed hyperlipidemia: Secondary | ICD-10-CM | POA: Insufficient documentation

## 2017-05-28 DIAGNOSIS — I779 Disorder of arteries and arterioles, unspecified: Secondary | ICD-10-CM | POA: Diagnosis present

## 2017-05-28 DIAGNOSIS — M1990 Unspecified osteoarthritis, unspecified site: Secondary | ICD-10-CM | POA: Insufficient documentation

## 2017-05-28 DIAGNOSIS — Z880 Allergy status to penicillin: Secondary | ICD-10-CM | POA: Diagnosis not present

## 2017-05-28 DIAGNOSIS — Z951 Presence of aortocoronary bypass graft: Secondary | ICD-10-CM | POA: Insufficient documentation

## 2017-05-28 DIAGNOSIS — N182 Chronic kidney disease, stage 2 (mild): Secondary | ICD-10-CM | POA: Diagnosis present

## 2017-05-28 DIAGNOSIS — I4581 Long QT syndrome: Secondary | ICD-10-CM | POA: Diagnosis not present

## 2017-05-28 DIAGNOSIS — I6523 Occlusion and stenosis of bilateral carotid arteries: Secondary | ICD-10-CM | POA: Diagnosis not present

## 2017-05-28 DIAGNOSIS — R0609 Other forms of dyspnea: Secondary | ICD-10-CM | POA: Diagnosis present

## 2017-05-28 DIAGNOSIS — I129 Hypertensive chronic kidney disease with stage 1 through stage 4 chronic kidney disease, or unspecified chronic kidney disease: Secondary | ICD-10-CM | POA: Insufficient documentation

## 2017-05-28 DIAGNOSIS — Z7984 Long term (current) use of oral hypoglycemic drugs: Secondary | ICD-10-CM | POA: Insufficient documentation

## 2017-05-28 DIAGNOSIS — I739 Peripheral vascular disease, unspecified: Secondary | ICD-10-CM

## 2017-05-28 DIAGNOSIS — E1122 Type 2 diabetes mellitus with diabetic chronic kidney disease: Secondary | ICD-10-CM | POA: Diagnosis not present

## 2017-05-28 DIAGNOSIS — Z8249 Family history of ischemic heart disease and other diseases of the circulatory system: Secondary | ICD-10-CM | POA: Insufficient documentation

## 2017-05-28 DIAGNOSIS — I493 Ventricular premature depolarization: Secondary | ICD-10-CM | POA: Diagnosis not present

## 2017-05-28 DIAGNOSIS — N183 Chronic kidney disease, stage 3 (moderate): Secondary | ICD-10-CM | POA: Diagnosis not present

## 2017-05-28 DIAGNOSIS — Z882 Allergy status to sulfonamides status: Secondary | ICD-10-CM | POA: Insufficient documentation

## 2017-05-28 DIAGNOSIS — F17210 Nicotine dependence, cigarettes, uncomplicated: Secondary | ICD-10-CM | POA: Diagnosis present

## 2017-05-28 DIAGNOSIS — I2582 Chronic total occlusion of coronary artery: Secondary | ICD-10-CM | POA: Diagnosis not present

## 2017-05-28 DIAGNOSIS — R0602 Shortness of breath: Secondary | ICD-10-CM

## 2017-05-28 DIAGNOSIS — E785 Hyperlipidemia, unspecified: Secondary | ICD-10-CM | POA: Diagnosis present

## 2017-05-28 HISTORY — PX: RIGHT/LEFT HEART CATH AND CORONARY/GRAFT ANGIOGRAPHY: CATH118267

## 2017-05-28 LAB — POCT I-STAT 3, ART BLOOD GAS (G3+)
ACID-BASE DEFICIT: 2 mmol/L (ref 0.0–2.0)
BICARBONATE: 22.1 mmol/L (ref 20.0–28.0)
O2 SAT: 86 %
TCO2: 23 mmol/L (ref 0–100)
pCO2 arterial: 35.6 mmHg (ref 32.0–48.0)
pH, Arterial: 7.401 (ref 7.350–7.450)
pO2, Arterial: 51 mmHg — ABNORMAL LOW (ref 83.0–108.0)

## 2017-05-28 LAB — GLUCOSE, CAPILLARY
GLUCOSE-CAPILLARY: 74 mg/dL (ref 65–99)
Glucose-Capillary: 99 mg/dL (ref 65–99)

## 2017-05-28 LAB — POCT I-STAT 3, VENOUS BLOOD GAS (G3P V)
Bicarbonate: 24.5 mmol/L (ref 20.0–28.0)
O2 SAT: 50 %
PCO2 VEN: 39 mmHg — AB (ref 44.0–60.0)
TCO2: 26 mmol/L (ref 0–100)
pH, Ven: 7.405 (ref 7.250–7.430)
pO2, Ven: 27 mmHg — CL (ref 32.0–45.0)

## 2017-05-28 SURGERY — RIGHT/LEFT HEART CATH AND CORONARY/GRAFT ANGIOGRAPHY
Anesthesia: LOCAL

## 2017-05-28 MED ORDER — LIDOCAINE HCL (CARDIAC) 20 MG/ML IV SOLN
INTRAVENOUS | Status: AC
  Filled 2017-05-28: qty 5

## 2017-05-28 MED ORDER — ACETAMINOPHEN 325 MG PO TABS
ORAL_TABLET | ORAL | Status: AC
  Filled 2017-05-28: qty 2

## 2017-05-28 MED ORDER — HEPARIN (PORCINE) IN NACL 2-0.9 UNIT/ML-% IJ SOLN
INTRAMUSCULAR | Status: AC | PRN
  Administered 2017-05-28: 1000 mL

## 2017-05-28 MED ORDER — ATROPINE SULFATE 1 MG/10ML IJ SOSY
PREFILLED_SYRINGE | INTRAMUSCULAR | Status: AC
  Filled 2017-05-28: qty 10

## 2017-05-28 MED ORDER — ACETAMINOPHEN 325 MG PO TABS: 650.0000 mg | ORAL_TABLET | ORAL | Status: DC | PRN

## 2017-05-28 MED ORDER — MIDAZOLAM HCL 2 MG/2ML IJ SOLN
INTRAMUSCULAR | Status: AC
  Filled 2017-05-28: qty 2

## 2017-05-28 MED ORDER — FENTANYL CITRATE (PF) 100 MCG/2ML IJ SOLN
INTRAMUSCULAR | Status: DC | PRN
  Administered 2017-05-28: 50 ug via INTRAVENOUS

## 2017-05-28 MED ORDER — LIDOCAINE HCL 1 % IJ SOLN
INTRAMUSCULAR | Status: AC
  Filled 2017-05-28: qty 20

## 2017-05-28 MED ORDER — ATROPINE SULFATE 1 MG/10ML IJ SOSY
PREFILLED_SYRINGE | INTRAMUSCULAR | Status: DC | PRN
  Administered 2017-05-28: 0.5 mg via INTRAVENOUS

## 2017-05-28 MED ORDER — FENTANYL CITRATE (PF) 100 MCG/2ML IJ SOLN
INTRAMUSCULAR | Status: AC
  Filled 2017-05-28: qty 2

## 2017-05-28 MED ORDER — ACETAMINOPHEN 325 MG PO TABS
650.0000 mg | ORAL_TABLET | Freq: Once | ORAL | Status: AC
  Administered 2017-05-28: 650 mg via ORAL

## 2017-05-28 MED ORDER — IOPAMIDOL (ISOVUE-370) INJECTION 76%
INTRAVENOUS | Status: DC | PRN
Start: 2017-05-28 — End: 2017-05-28
  Administered 2017-05-28: 120 mL via INTRA_ARTERIAL

## 2017-05-28 MED ORDER — LIDOCAINE HCL (CARDIAC) 20 MG/ML IV SOLN
INTRAVENOUS | Status: DC | PRN
  Administered 2017-05-28: 38.55 mg via INTRAVENOUS

## 2017-05-28 MED ORDER — IOPAMIDOL (ISOVUE-370) INJECTION 76%
INTRAVENOUS | Status: AC
  Filled 2017-05-28: qty 125

## 2017-05-28 MED ORDER — OXYCODONE-ACETAMINOPHEN 5-325 MG PO TABS: 1.0000 | ORAL_TABLET | ORAL | Status: DC | PRN

## 2017-05-28 MED ORDER — HEPARIN (PORCINE) IN NACL 2-0.9 UNIT/ML-% IJ SOLN
INTRAMUSCULAR | Status: AC
  Filled 2017-05-28: qty 500

## 2017-05-28 MED ORDER — SODIUM CHLORIDE 0.9 % WEIGHT BASED INFUSION: 1.0000 mL/kg/h | INTRAVENOUS | Status: DC

## 2017-05-28 MED ORDER — IOPAMIDOL (ISOVUE-370) INJECTION 76%
INTRAVENOUS | Status: AC
  Filled 2017-05-28: qty 50

## 2017-05-28 MED ORDER — SODIUM CHLORIDE 0.9 % WEIGHT BASED INFUSION
3.0000 mL/kg/h | INTRAVENOUS | Status: DC
  Administered 2017-05-28: 3 mL/kg/h via INTRAVENOUS

## 2017-05-28 MED ORDER — SODIUM CHLORIDE 0.9% FLUSH: 3.0000 mL | Freq: Two times a day (BID) | INTRAVENOUS | Status: DC

## 2017-05-28 MED ORDER — ONDANSETRON HCL 4 MG/2ML IJ SOLN: 4.0000 mg | Freq: Four times a day (QID) | INTRAMUSCULAR | Status: DC | PRN

## 2017-05-28 MED ORDER — SODIUM CHLORIDE 0.9 % IV SOLN: 250.0000 mL | INTRAVENOUS | Status: DC | PRN

## 2017-05-28 MED ORDER — LIDOCAINE HCL (PF) 1 % IJ SOLN
INTRAMUSCULAR | Status: DC | PRN
Start: 2017-05-28 — End: 2017-05-28
  Administered 2017-05-28 (×2): 15 mL

## 2017-05-28 MED ORDER — SODIUM CHLORIDE 0.9% FLUSH: 3.0000 mL | INTRAVENOUS | Status: DC | PRN

## 2017-05-28 MED ORDER — ASPIRIN 81 MG PO CHEW
81.0000 mg | CHEWABLE_TABLET | ORAL | Status: DC
Start: 2017-05-29 — End: 2017-05-28

## 2017-05-28 MED ORDER — MIDAZOLAM HCL 2 MG/2ML IJ SOLN
INTRAMUSCULAR | Status: DC | PRN
  Administered 2017-05-28: 1 mg via INTRAVENOUS

## 2017-05-28 MED ORDER — SODIUM CHLORIDE 0.9 % IV SOLN: INTRAVENOUS | Status: DC

## 2017-05-28 SURGICAL SUPPLY — 15 items
CATH INFINITI 5 FR IM (CATHETERS) ×1 IMPLANT
CATH INFINITI 5FR JL4 (CATHETERS) ×1 IMPLANT
CATH INFINITI 5FR MPB2 (CATHETERS) ×1 IMPLANT
CATH INFINITI JR4 5F (CATHETERS) ×1 IMPLANT
CATH SWAN GANZ 7F STRAIGHT (CATHETERS) ×1 IMPLANT
GUIDEWIRE ANGLED .035X150CM (WIRE) ×1 IMPLANT
KIT HEART LEFT (KITS) ×2 IMPLANT
PACK CARDIAC CATHETERIZATION (CUSTOM PROCEDURE TRAY) ×2 IMPLANT
SHEATH PINNACLE 5F 10CM (SHEATH) ×1 IMPLANT
SHEATH PINNACLE 7F 10CM (SHEATH) ×1 IMPLANT
TRANSDUCER W/STOPCOCK (MISCELLANEOUS) ×2 IMPLANT
TUBING CIL FLEX 10 FLL-RA (TUBING) ×2 IMPLANT
WIRE EMERALD 3MM-J .025X260CM (WIRE) ×1 IMPLANT
WIRE EMERALD 3MM-J .035X150CM (WIRE) ×1 IMPLANT
WIRE EMERALD ST .035X150CM (WIRE) ×1 IMPLANT

## 2017-05-28 NOTE — H&P (View-Only) (Signed)
Cardiology Office Note:    Date:  05/16/2017   ID:  Cory Bradley, DOB 11/02/1933, MRN [id number]  PCP:  Cory Bradley, David, MD  Cardiologist:  Dr. Verdis PrimeHenry Bradley   [facility]: Dr. Arbie Bradley  Referring MD: Cory Bradley, David, MD   Chief Complaint  Patient presents with  . Shortness of Breath    History of Present Illness:    Cory GroutCharles E Bradley is a 83 y.o. male with a hx of CAD status post inferior STEMI in 2005 treated with angioplasty to the RCA followed by CABG 4, aortic stenosis, PSVT (tx with adenosine in the ED in 07/2016), carotid artery disease, diabetes, HTN, HL, CKD, tobacco abuse. He was seen by Cory BoozerLaura Ingold, NP in 03/2017 for follow up.  His QTc was prolonged on Remeron which was new.  This was DC'd with improved QTc.  He was at [facility] today for follow up on his carotid disease.  He was noted to have a high BP (190/80) and he was asked to follow up Cardiology.  He walked into our clinic to be seen today.   Cory Bradley is here alone today.  He has been feeling quite fatigued for over a month now.  He notes dyspnea on exertion with minimal activity.  He has to stop taking his trash down to the curb.  He also gets short of breath walking to his bathroom from his bedroom.  He denies chest pain, orthopnea, PND, edema.  He denies syncope. He gets lightheaded if he stands quickly. He notes a long hx of "white coat HTN."  His BP at home is usually 120s/80s.  His BP usually runs high in the office.  Recently, his BP was fairly normal in our office.    Prior CV studies:   The following studies were reviewed today:  Carotid US 10/2016 R 1-39; L 40-59  Echo 09/2016 Mild LVH, EF 60-65, normal wall motion, grade 1 diastolic dysfunction, moderate aortic stenosis (mean 32, peak 52), mild AI, moderate LAE, mild RAE  Myoview 10/2007 No ischemia, diaphragmatic attenuation, EF 63  Cardiac Catheterization 12/2003 EF 50, inferior HK LM distal 40-50 LAD 60, 75 RI proximal 70-80 LCx ostial 75-85, mid 95, distal  occluded RCA proximal 95, mid occluded PCI: POBA to the proximal and mid RCA  Past Medical History:  Diagnosis Date  . Allergic rhinitis   . Aortic stenosis    a. Mild-mod by echo 01/2014.  [name]. CKD (chronic kidney disease), stage II   . Coronary artery disease    a. acute inferior MI s/p PTCA of RCA followed by CABGx4 in 2005,  . Diabetes mellitus (HCC)   . ED (erectile dysfunction)   . Hyperlipidemia   . Hypertension   . Irregular heartbeat 10/2016  . Left carotid stenosis   . Memory loss   . Osteoarthritis   . Reflux   . Tobacco abuse     Past Surgical History:  Procedure Laterality Date  . CORONARY ARTERY BYPASS GRAFT      Current Medications: Current Meds  Medication Sig  . atorvastatin (LIPITOR) 80 MG tablet Take 80 mg by mouth daily.  [name]. donepezil (ARICEPT) 5 MG tablet Take 5 mg by mouth at bedtime.  . memantine (NAMENDA) 10 MG tablet Take 10 mg by mouth 2 (two) times daily.  . metFORMIN (GLUCOPHAGE) 500 MG tablet TAKE 1 TABLET (500 MG TOTAL) BY MOUTH DAILY.  . naproxen sodium (ANAPROX) 550 MG tablet Take 550 mg by mouth 2 (two) times daily as needed  for mild pain.   . niacin (NIASPAN) 1000 MG CR tablet Take 1,000 mg by mouth at bedtime.  . Omega-3 Fatty Acids (FISH OIL) 1000 MG CAPS Take 1,000 mg by mouth 2 (two) times daily.  [name] omeprazole (PRILOSEC) 40 MG capsule Take 40 mg by mouth daily.  . propranolol ER (INDERAL LA) 120 MG 24 hr capsule Take 120 mg by mouth daily.  . ramipril (ALTACE) 10 MG capsule Take 10 mg by mouth daily.  . sildenafil (VIAGRA) 100 MG tablet Take 100 mg by mouth daily as needed for erectile dysfunction.  . temazepam (RESTORIL) 15 MG capsule Take 15 mg by mouth at bedtime as needed.   . [DISCONTINUED] aspirin 325 MG tablet Take 325 mg by mouth daily.     Allergies:   Penicillins and Sulfa antibiotics   Social History   Social History  . Marital status: Married    Spouse name: N/A  . Number of children: N/A  . Years of education: N/A    Social History Main Topics  . Smoking status: Current Every Day Smoker    Packs/day: 0.50    Years: 2.00    Types: Cigarettes    Start date: 08/21/2015  . Smokeless tobacco: Never Used  . Alcohol use No  . Drug use: No  . Sexual activity: Not Asked   Other Topics Concern  . None   Social History Narrative  . None     Family Hx: The patient's family history includes Heart disease in his father.  ROS:   Please see the history of present illness.    ROS All other systems reviewed and are negative.   EKGs/Labs/Other Test Reviewed:    EKG:  EKG is  ordered today.  The ekg ordered today demonstrates NSR, HR 63, leftward axis, QTc 499 ms, frequent PVCs  Recent Labs: 07/27/2016: Hemoglobin 12.9 04/13/2017: Hemoglobin WILL FOLLOW; Magnesium 1.7; Platelets WILL FOLLOW 05/07/2017: BUN 21; Creatinine, Ser 1.29; Potassium 4.5; Sodium 141   Recent Lipid Panel    Component Value Date/Time   CHOL  11/07/2007 0305    88        ATP III CLASSIFICATION:  <200     mg/dL   Desirable  960-454  mg/dL   Borderline High  >=098    mg/dL   High   TRIG 49 11/91/4782 0305   HDL 41 11/07/2007 0305   CHOLHDL 2.1 11/07/2007 0305   VLDL 10 11/07/2007 0305   LDLCALC  11/07/2007 0305    37        Total Cholesterol/HDL:CHD Risk Coronary Heart Disease Risk Table                     Men   Women  1/2 Average Risk   3.4   3.3     Physical Exam:    VS:  BP (!) 198/85 (BP Location: Right Arm, Cuff Size: Normal)   Pulse 63   Ht 6\' 2"  (1.88 m)   Wt 172 lb 1.9 oz (78.1 kg)   BMI 22.10 kg/m     Wt Readings from Last 3 Encounters:  05/16/17 172 lb 1.9 oz (78.1 kg)  05/16/17 173 lb (78.5 kg)  04/13/17 170 lb 4 oz (77.2 kg)     Physical Exam  Constitutional: He is oriented to person, place, and time. He appears well-developed and well-nourished. No distress.  HENT:  Head: Normocephalic and atraumatic.  Eyes: No scleral icterus.  Neck: Normal range of motion. No  JVD present.   Cardiovascular: Normal rate, regular rhythm, S1 normal and S2 normal.   Murmur heard.  Harsh crescendo-decrescendo systolic murmur is present with a grade of 3/6  at the upper right sternal border Pulmonary/Chest: Effort normal and breath sounds normal. He has no wheezes. He has no rhonchi. He has no rales.  Abdominal: Soft. There is no tenderness.  Musculoskeletal: He exhibits no edema.  Neurological: He is alert and oriented to person, place, and time.  Skin: Skin is warm and dry.  Psychiatric: He has a normal mood and affect.    ASSESSMENT:    1. Shortness of breath   2. Nonrheumatic aortic valve stenosis   3. Coronary artery disease involving native coronary artery of native heart without angina pectoris   4. Essential hypertension   5. PVC's (premature ventricular contractions)   6. Mixed hyperlipidemia   7. Bilateral carotid artery disease (HCC)   8. CKD (chronic kidney disease) stage 3, GFR 30-59 ml/min   9. Type 2 diabetes mellitus with complication, without long-term current use of insulin (HCC)    PLAN:    In order of problems listed above:  1. Shortness of breath - He notes progressively worsening shortness of breath over the past 1 month.  He has a lot of ectopy on his ECG.  He denies chest pain.  His aortic valve gradients were in the high moderate range in 09/2016.  I am concerned that his symptoms are related to worsening aortic stenosis.  His bypass grafts are also 81 years old.  It has been several years since his last nuclear stress test.  I have recommended proceeding with a Cardiac Catheterization to further evaluate.  I d/w Dr. Everette Rank (DOD) who agreed.  Risks and benefits of cardiac catheterization have been discussed with the patient.  These include bleeding, infection, kidney damage, stroke, heart attack, death.  The patient understands these risks and is willing to proceed.   -  Schedule an echocardiogram to re-evaluate his aortic stenosis and LVF  -   Schedule Cardiac Catheterization (R and L heart cath)  -  BMET, CBC, Mg2+, INR today  2. Nonrheumatic aortic valve stenosis -  I am concerned his aortic stenosis is worse.  Proceed with Cardiac Catheterization and Echo as noted.  If aortic stenosis is severe now, plan referral to Dr. Tonny Bollman or Dr. Verne Carrow to evaluate for TAVR.  3. Coronary artery disease involving native coronary artery of native heart without angina pectoris -  S/p CABG 2005.  Myoview in 2008 was low risk.  Recent dyspnea on exertion is concerning for severe aortic stenosis vs progressive CAD.  Proceed with echocardiogram and Cardiac Catheterization as noted.  Continue ASA (change to 81 mg QD).  Continue statin, beta-blocker.    4. Essential hypertension -  BP tends to run very high in the office.  I am concerned about increasing his blood pressure medications if his aortic stenosis has really worsened.  I have asked him to monitor BP at home and send readings after 1 week.   5. PVC's (premature ventricular contractions) -  Frequent ventricular ectopy has persisted over the past few weeks.  Obtain BMET, Mg2+ today.   6. Mixed hyperlipidemia - Continue statin.    7. Bilateral carotid artery disease (HCC) - FU with [facility] as planned.  Continue ASA, statin.   8. CKD (chronic kidney disease) stage 3, GFR 30-59 ml/min -  His Creatinine should be ok for Cardiac Catheterization.  Will  make sure he is hydrated well.  9. Diabetes - Hold Metformin 24 hours before and 48 hours after cardiac cath.    Dispo:  Return in about 2 weeks (around 05/30/2017) for Follow up after testing with Dr. Katrinka Blazing or Tereso Newcomer, PA-C .   Medication Adjustments/Labs and Tests Ordered: Current medicines are reviewed at length with the patient today.  Concerns regarding medicines are outlined above.  Orders/Tests:  Orders Placed This Encounter  Procedures  . Basic Metabolic Panel (BMET)  . Magnesium  . CBC w/Diff  . INR/PT  .  EKG 12-Lead  . ECHOCARDIOGRAM COMPLETE   Medication changes: Meds ordered this encounter  Medications  . aspirin EC 81 MG tablet    Sig: Take 1 tablet (81 mg total) by mouth daily.   Signed, Tereso Newcomer, PA-C  05/16/2017 4:49 PM    [facility] [address] [place], [place], [place]  [address] Phone: [phone]; Fax: [phone]   I have reviewed assessment and plan.  Agree with above as stated.    Lance Muss

## 2017-05-28 NOTE — Interval H&P Note (Signed)
Cath Lab Visit (complete for each Cath Lab visit)  Clinical Evaluation Leading to the Procedure:   ACS: No.  Non-ACS:    Anginal Classification: CCS Bradley  Anti-ischemic medical therapy: Maximal Therapy (2 or more classes of medications)  Non-Invasive Test Results: No non-invasive testing performed  Prior CABG: Previous CABG      History and Physical Interval Note:  05/28/2017 2:38 PM  Cory Bradley  has presented today for surgery, with the diagnosis of aortic stenosis, cad, sob  The various methods of treatment have been discussed with the patient and family. After consideration of risks, benefits and other options for treatment, the patient has consented to  Procedure(s): Right/Left Heart Cath and Coronary/Graft Angiography (N/A) as a surgical intervention .  The patient's history has been reviewed, patient examined, no change in status, stable for surgery.  I have reviewed the patient's chart and labs.  Questions were answered to the patient's satisfaction.     Lyn RecordsHenry W Keatyn Jawad Bradley

## 2017-05-28 NOTE — Discharge Instructions (Signed)
NO METFORMIN/GLUCOPHAGE FOR 2 DAYS ° ° °Femoral Site Care °Refer to this sheet in the next few weeks. These instructions provide you with information about caring for yourself after your procedure. Your health care provider may also give you more specific instructions. Your treatment has been planned according to current medical practices, but problems sometimes occur. Call your health care provider if you have any problems or questions after your procedure. °What can I expect after the procedure? °After your procedure, it is typical to have the following: °· Bruising at the site that usually fades within 1-2 weeks. °· Blood collecting in the tissue (hematoma) that may be painful to the touch. It should usually decrease in size and tenderness within 1-2 weeks. ° °Follow these instructions at home: °· Take medicines only as directed by your health care provider. °· You may shower 24-48 hours after the procedure or as directed by your health care provider. Remove the bandage (dressing) and gently wash the site with plain soap and water. Pat the area dry with a clean towel. Do not rub the site, because this may cause bleeding. °· Do not take baths, swim, or use a hot tub until your health care provider approves. °· Check your insertion site every day for redness, swelling, or drainage. °· Do not apply powder or lotion to the site. °· Limit use of stairs to twice a day for the first 2-3 days or as directed by your health care provider. °· Do not squat for the first 2-3 days or as directed by your health care provider. °· Do not lift over 10 lb (4.5 kg) for 5 days after your procedure or as directed by your health care provider. °· Ask your health care provider when it is okay to: °? Return to work or school. °? Resume usual physical activities or sports. °? Resume sexual activity. °· Do not drive home if you are discharged the same day as the procedure. Have someone else drive you. °· You may drive 24 hours after the  procedure unless otherwise instructed by your health care provider. °· Do not operate machinery or power tools for 24 hours after the procedure or as directed by your health care provider. °· If your procedure was done as an outpatient procedure, which means that you went home the same day as your procedure, a responsible adult should be with you for the first 24 hours after you arrive home. °· Keep all follow-up visits as directed by your health care provider. This is important. °Contact a health care provider if: °· You have a fever. °· You have chills. °· You have increased bleeding from the site. Hold pressure on the site. °Get help right away if: °· You have unusual pain at the site. °· You have redness, warmth, or swelling at the site. °· You have drainage (other than a small amount of blood on the dressing) from the site. °· The site is bleeding, and the bleeding does not stop after 30 minutes of holding steady pressure on the site. °· Your leg or foot becomes pale, cool, tingly, or numb. °This information is not intended to replace advice given to you by your health care provider. Make sure you discuss any questions you have with your health care provider. °Document Released: 08/07/2014 Document Revised: 05/11/2016 Document Reviewed: 06/23/2014 °Elsevier Interactive Patient Education © 2018 Elsevier Inc. ° °

## 2017-05-28 NOTE — Progress Notes (Signed)
Site area: Right groin a 5 french arterial and 7 french venous sheath was removed  Site Prior to Removal:  Level 0  Pressure Applied For 20 MINUTES    Bedrest Beginning at 1650p  Manual:   Yes.    Patient Status During Pull:  stable  Post Pull Groin Site:  Level 0  Post Pull Instructions Given:  Yes.    Post Pull Pulses Present:  Yes.    Dressing Applied:  Yes.    Comments:  VS remain stable during sheath pull.

## 2017-05-29 ENCOUNTER — Telehealth: Payer: Self-pay | Admitting: *Deleted

## 2017-05-29 ENCOUNTER — Encounter (HOSPITAL_COMMUNITY): Payer: Self-pay | Admitting: Interventional Cardiology

## 2017-05-29 NOTE — Telephone Encounter (Signed)
Cory Bradley and has been notified of 6/8 echo results and findings. Pt had cath yesterday with Dr. Katrinka BlazingSmith and states to me that Dr. Katrinka BlazingSmith wants him to have a CXR as well as possible referral to Dr. Clifton JamesMcAlhany or Dr. Excell Seltzerooper to discuss possible TAVR. I explained to the pt that I did not see where Dr. Katrinka BlazingSmith ordered a CXR, though I will touch base with Dr. Katrinka BlazingSmith and let pt know if we will proceed with CXR. Pt thanked me for my help and my time. Pt has appt with Bing NeighborsScott W, PAC on 7/2 and Sr. Katrinka BlazingSmith on 7/23.

## 2017-05-29 NOTE — Telephone Encounter (Signed)
Lmtcb to go over echo results. Pt had his cath yesterday.

## 2017-05-29 NOTE — Telephone Encounter (Signed)
-----   Message from Scott T Weaver, PA-C sent at 05/28/2017 12:26 PM EDT ----- Please call the patient. The ejection fraction is normal.  The gradients across the aortic valve are somewhat worse (aortic stenosis is worse) compared with last year.   Cardiac Catheterization is pending today. Keep follow up as planned.   Please fax a copy of this study result to his PCP:  Bouska, David, MD  Thanks! Scott Weaver, PA-C    05/28/2017 12:21 PM  

## 2017-05-29 NOTE — Telephone Encounter (Signed)
-----   Message from Beatrice LecherScott T Weaver, [place]PA-C sent at 05/28/2017 12:26 PM EDT ----- Please call the patient. The ejection fraction is normal.  The gradients across the aortic valve are somewhat worse (aortic stenosis is worse) compared with last year.   Cardiac Catheterization is pending today. Keep follow up as planned.   Please fax a copy of this study result to his PCP:  Tracey HarriesBouska, David, MD  Thanks! Tereso NewcomerScott Weaver, PA-C    05/28/2017 12:21 PM

## 2017-05-30 ENCOUNTER — Telehealth: Payer: Self-pay | Admitting: *Deleted

## 2017-05-30 DIAGNOSIS — R0600 Dyspnea, unspecified: Secondary | ICD-10-CM

## 2017-05-30 NOTE — Telephone Encounter (Signed)
-----   Message from Lyn RecordsHenry W Smith, MD sent at 05/29/2017  9:04 AM EDT ----- All of his grafts are widely patent. I hemodynamic recordings were compromised by continuous ventricular bigeminy during the procedure making measurements non-physiologic. We documented a peak gradient on post-PVC beat several proximally 35 mmHg. Needs pulmonary function test to exclude significant COPD as a cause of dyspnea. If no COPD, he will need to be referred to the valve clinic for consideration of TAVR.  Victorino DikeJennifer please schedule PFT.  ----- Message ----- From: Kennon RoundsWeaver, Scott T, PA-C Sent: 05/28/2017  12:44 PM To: Lyn RecordsHenry W Smith, MD, Beatrice LecherScott T Weaver, PA-C  FYI . [name]. Bonita Quin. You are doing his Cardiac Catheterization today. With a mean gradient of 40 and symptoms, would you call this severe aortic stenosis now? Thanks,  Boston ScientificScott

## 2017-06-06 ENCOUNTER — Ambulatory Visit (HOSPITAL_COMMUNITY)
Admission: RE | Admit: 2017-06-06 | Discharge: 2017-06-06 | Disposition: A | Discharge status: Home / Self Care | Payer: Medicare Other | Source: Ambulatory Visit | Attending: Interventional Cardiology | Admitting: Interventional Cardiology

## 2017-06-06 ENCOUNTER — Telehealth: Payer: Self-pay | Admitting: Interventional Cardiology

## 2017-06-06 DIAGNOSIS — J449 Chronic obstructive pulmonary disease, unspecified: Secondary | ICD-10-CM | POA: Insufficient documentation

## 2017-06-06 DIAGNOSIS — R942 Abnormal results of pulmonary function studies: Secondary | ICD-10-CM

## 2017-06-06 DIAGNOSIS — R0600 Dyspnea, unspecified: Secondary | ICD-10-CM | POA: Diagnosis present

## 2017-06-06 LAB — PULMONARY FUNCTION TEST
DL/VA % pred: 48 %
DL/VA: 2.31 ml/min/mmHg/L
DLCO unc % pred: 35 %
DLCO unc: 13.33 ml/min/mmHg
FEF 25-75 Post: 1.83 L/sec
FEF 25-75 Pre: 1.63 L/sec
FEF2575-%Change-Post: 12 %
FEF2575-%Pred-Post: 80 %
FEF2575-%Pred-Pre: 71 %
FEV1-%Change-Post: 2 %
FEV1-%PRED-POST: 84 %
FEV1-%PRED-PRE: 83 %
FEV1-POST: 2.82 L
FEV1-PRE: 2.76 L
FEV1FVC-%Change-Post: 0 %
FEV1FVC-%Pred-Pre: 99 %
FEV6-%Change-Post: 1 %
FEV6-%PRED-POST: 88 %
FEV6-%PRED-PRE: 87 %
FEV6-POST: 3.86 L
FEV6-Pre: 3.81 L
FEV6FVC-%CHANGE-POST: 0 %
FEV6FVC-%PRED-POST: 102 %
FEV6FVC-%Pred-Pre: 103 %
FVC-%Change-Post: 1 %
FVC-%Pred-Post: 86 %
FVC-%Pred-Pre: 84 %
FVC-POST: 4 L
FVC-PRE: 3.92 L
POST FEV6/FVC RATIO: 97 %
PRE FEV6/FVC RATIO: 97 %
Post FEV1/FVC ratio: 70 %
Pre FEV1/FVC ratio: 70 %
RV % PRED: 71 %
RV: 2.08 L
TLC % PRED: 81 %
TLC: 6.42 L

## 2017-06-06 MED ORDER — ALBUTEROL SULFATE (2.5 MG/3ML) 0.083% IN NEBU
2.5000 mg | INHALATION_SOLUTION | Freq: Once | RESPIRATORY_TRACT | Status: AC
  Administered 2017-06-06: 2.5 mg via RESPIRATORY_TRACT

## 2017-06-06 NOTE — Telephone Encounter (Signed)
F/u Message ° °Pt returning RN call. Please call back to discuss  °

## 2017-06-06 NOTE — Telephone Encounter (Signed)
Spoke with pt and went over PFT results and recommendations per Dr. Katrinka BlazingSmith.  Pt verbalized understanding and was in agreement with this plan.  Referral placed for Pulmonology.

## 2017-06-14 ENCOUNTER — Ambulatory Visit (INDEPENDENT_AMBULATORY_CARE_PROVIDER_SITE_OTHER): Payer: Medicare Other | Admitting: Internal Medicine

## 2017-06-14 ENCOUNTER — Encounter: Payer: Self-pay | Admitting: Internal Medicine

## 2017-06-14 VITALS — BP 142/80 | HR 61 | Ht 74.0 in | Wt 173.4 lb

## 2017-06-14 DIAGNOSIS — R0609 Other forms of dyspnea: Secondary | ICD-10-CM

## 2017-06-14 DIAGNOSIS — F17210 Nicotine dependence, cigarettes, uncomplicated: Secondary | ICD-10-CM

## 2017-06-14 DIAGNOSIS — I10 Essential (primary) hypertension: Secondary | ICD-10-CM

## 2017-06-14 MED ORDER — IRBESARTAN 150 MG PO TABS: 150.0000 mg | ORAL_TABLET | Freq: Every day | ORAL | 11 refills | Status: AC

## 2017-06-14 NOTE — Patient Instructions (Addendum)
Stop ramapril   avapro 150 mg one half daily and increase to a whole pill daily and your hoarseness/ throat drainage should improve if not your breathing and further follow up on Blood Pressure  per Dr Katrinka BlazingSmith as planned  The key is to stop smoking completely before smoking completely stops you - it's not too late !

## 2017-06-14 NOTE — Assessment & Plan Note (Signed)

## 2017-06-14 NOTE — Assessment & Plan Note (Signed)
D/c acei 06/14/2017 due to hoarseness/ throat clearing  In the best review of chronic cough to date ( NEJM 2016 375 [id number]) ,  ACEi are now felt to cause cough in up to  20% of pts which is a 4 fold increase from previous reports and does not include the variety of non-specific complaints we see in pulmonary clinic in pts on ACEi but previously attributed to another dx like  Copd/asthma and  include PNDS, throat  congestion, "bronchitis", unexplained dyspnea and noct "strangling" sensations, and hoarseness, but also  atypical /refractory GERD symptoms like dysphagia and "bad heartburn"   The only way I know  to prove this is not an "ACEi Case" is a trial off ACEi x a minimum of 6 weeks then regroup.   Try avapro 150 mg one half daily and increase to as much as 300 mg daily if needed but build up slowly due to AS, pt to self monitor

## 2017-06-14 NOTE — Progress Notes (Signed)
Subjective:     Patient ID: Cory Bradley, male   DOB: Aug 02, 1933,    MRN: [id number]  HPI  51 yowm retired professor at [employer]  active smoker new doe spring of 2018 (p wife died)   Progressive to point where sob with adl's > eval by Dr Stephens Shire with dx of irregular rhytm so referred to pulmonary clinic 06/14/2017 by Dr  Smith/ primary is [facility]  06/14/2017 1st [facility] office visit/ Cory Bradley   Chief Complaint  Patient presents with  . Pulmonary Consult    Referred by Dr. Verdis Prime. Pt c/o DOE x 2 months- occurs with exertion such as walking up his driveway which has a slight incline or even just getting up to brush his teeth in the am.    on allergy shots x 40 shots stopped one year sneezing/ itchy eyes have not flared On propranol chronically prior to onset of doe also altace chronically with tendency to hoarseness and pnds attributed to "allergies"   Presently doe = MMRC2 = can't walk a nl pace on a flat grade indefinitely s sob but does fine slow and flat eg shopping    No obvious day to day or daytime variability or assoc excess/ purulent sputum or mucus plugs or hemoptysis or cp or chest tightness, subjective wheeze or overt sinus or hb symptoms. No unusual exp hx or h/o childhood pna/ asthma or knowledge of premature birth.  Sleeping ok without nocturnal  or early am exacerbation  of respiratory  c/o's or need for noct saba. Also denies any obvious fluctuation of symptoms with weather or environmental changes or other aggravating or alleviating factors except as outlined above   Current Medications, Allergies, Complete Past Medical History, Past Surgical History, Family History, and Social History were reviewed in [employer] record.  ROS  The following are not active complaints unless bolded sore throat, dysphagia, dental problems, itching, sneezing,  nasal congestion or excess/ purulent secretions, ear ache,   fever, chills, sweats, unintended wt loss,  classically pleuritic or exertional cp,  orthopnea pnd or leg swelling, presyncope, palpitations, abdominal pain, anorexia, nausea, vomiting, diarrhea  or change in bowel or bladder habits, change in stools or urine, dysuria,hematuria,  rash, arthralgias, visual complaints, headache, numbness, weakness or ataxia or problems with walking or coordination,  change in mood/affect or memory.           I personally reviewed images and agree with radiology impression as follows:  CXR:   04/11/17 Normal heart size.Status post CABG.Moderate aortic tortuosity. There is no edema, consolidation, effusion, or pneumothorax. Exaggerated kyphosis with chronic appearing moderate compression fracture in the lower thoracic spine.           Review of Systems     Objective:   Physical Exam      Ambulatory moderately Hoarse wm nad  Wt Readings from Last 3 Encounters:  06/14/17 173 lb 6.4 oz (78.7 kg)  05/28/17 170 lb (77.1 kg)  05/16/17 172 lb 1.9 oz (78.1 kg)    Vital signs reviewed - Note on arrival 02 sats  100% on RA    HEENT: nl dentition, turbinates bilaterally, and oropharynx. Nl external ear canals without cough reflex   NECK :  without JVD/Nodes/TM/ nl carotid upstrokes bilaterally   LUNGS: no acc muscle use,  Nl contour chest with minimal insp and exp rhonchi, mostly pseudowheeze    CV:  RRR  no s3 - III/VI sem, no  increase in P2, and  no edema   ABD:  soft and nontender with nl inspiratory excursion in the supine position. No bruits or organomegaly appreciated, bowel sounds nl  MS:  Nl gait/ ext warm without deformities, calf tenderness, cyanosis or clubbing No obvious joint restrictions   SKIN: warm and dry without lesions    NEURO:  alert, approp, nl sensorium with  no motor or cerebellar deficits apparent.          Assessment:

## 2017-06-14 NOTE — Assessment & Plan Note (Addendum)
[facility]  05/28/17   PCWP 26 with AV gradient mean 32.8  - PFT's  06/06/17  No airflow obst on propranolol prior to study with DLCO  35 % corrects to 48  % for alv volume   - 06/14/2017  Walked RA x 3 laps @ 185 ft each stopped due to  End of study , tired > sob, sats 100%  - trial off acei 06/14/2017   DDX of  difficult airways management almost all start with A and  include Adherence, Ace Inhibitors, Acid Reflux, Active Sinus Disease, Alpha 1 Antitripsin deficiency, Anxiety masquerading as Airways dz,  ABPA,  Allergy(esp in young), Aspiration (esp in elderly), Adverse effects of meds,  Active smokers, A bunch of PE's (a small clot burden can't cause this syndrome unless there is already severe underlying pulm or vascular dz with poor reserve) plus two Bs  = Bronchiectasis and Beta blocker use..and one C= CHF   Adherence is always the initial "prime suspect" and is a multilayered concern that requires a "trust but verify" approach in every patient - starting with knowing how to use medications, especially inhalers, correctly, keeping up with refills and understanding the fundamental difference between maintenance and prns vs those medications only taken for a very short course and then stopped and not refilled.   Active smoking  (see separate a/p)   ACEi adverse effects at the  top of the usual list of suspects and the only way to rule it out is a trial off > see a/p  (see uacs)  ? Acid (or non-acid) GERD > always difficult to exclude as up to 75% of pts in some series report no assoc GI/ Heartburn symptoms> rec continue max (24h)  acid suppression and diet restrictions/ reviewed     ?allergy/ asthma > the fact he has no obst on propranolol is strongly against asthma and his "allergies" with pnds more likely due to acei as no real fluctuation with seasons/ environment  ? Beta blocker effect > see above, no evidence of airflow obst   ? chf / AS > f/u cards planned    Total time devoted to counseling  >  50 % of initial 60 min office visit:  review case with pt/ discussion of options/alternatives/ personally creating written customized instructions  in presence of pt  then going over those specific  Instructions directly with the pt including how to use all of the meds but in particular covering each new medication in detail and the difference between the maintenance= "automatic" meds and the prns using an action plan format for the latter (If this problem/symptom => do that organization reading Left to right).  Please see AVS from this visit for a full list of these instructions which I personally wrote for this pt and  are unique to this visit.

## 2017-06-17 NOTE — Progress Notes (Addendum)
Cardiology Office Note    Date:  06/18/2017   ID:  HERRICK HARTOG, DOB Jun 01, 1933, MRN [id number]  PCP:  Bernerd Limbo, MD  Cardiologist: Sinclair Grooms, MD   Chief Complaint  Patient presents with  . Shortness of Breath  . Aortic Stenosis    History of Present Illness:  Cory Bradley is a 83 y.o. male with a hx of CAD status post inferior STEMI in 2005 treated with angioplasty to the RCA followed by CABG 4, aortic stenosis, PSVT (tx with adenosine in the ED in 07/2016), carotid artery disease, diabetes, HTN, HL, CKD, tobacco abuse. Because of dyspnea, recent cath done and suggests significant AS.  The patient's wife was diagnosed with pancreatic cancer in 2016. Prior to that time he exercises regularly, had no shortness of breath or cardiac complaints. After her death in spring 2018, he went back to his exercise program but experienced significant dyspnea on exertion and had inability to exercise to the level as in 2016. In the interim he developed significant progression of the aortic valve murmur that he previously had. An echocardiogram was done and suggested moderately severe aortic stenosis. He had previous bypass surgery. Heart catheterization and findings documented patent vein grafts and LIMA to LAD. Hemodynamic recordings were impaired by the presence of ventricular bigeminy at the time of the procedure. He has subsequently been referred to pulmonary medicine to determine if dyspnea is significantly contributed to by COPD. According to Dr. Melvyn Novas, it is not. He is here today, somewhat frustrated that the shortness of breath is still present and slightly progressive. Over the past 2 weeks he has developed ankle edema.  Past Medical History:  Diagnosis Date  . Allergic rhinitis   . Aortic stenosis    a. Mild-mod by echo 01/2014. // b. Echo 6/18: Mod LVH EF 55-60, normal wall motion, Gr 2 DD, mod to severe AS (mean 40, peak 68; AVA by mean velocity 0.73 cm), mild AI, mod MAC, mild  MR, severe LAE, mildly reduced RVSF, mod RAE  . CKD (chronic kidney disease), stage II   . Coronary artery disease    a. acute inferior MI s/p PTCA of RCA followed by CABGx4 in 2005,  . Diabetes mellitus ([place])   . ED (erectile dysfunction)   . Hyperlipidemia   . Hypertension   . Irregular heartbeat 10/2016  . Left carotid stenosis   . Memory loss   . Osteoarthritis   . Reflux   . Tobacco abuse     Past Surgical History:  Procedure Laterality Date  . CORONARY ARTERY BYPASS GRAFT    . RIGHT/LEFT HEART CATH AND CORONARY/GRAFT ANGIOGRAPHY N/A 05/28/2017   Procedure: Right/Left Heart Cath and Coronary/Graft Angiography;  Surgeon: Belva Crome, MD;  Location: [facility];  Service: Cardiovascular;  Laterality: N/A;    Current Medications: Outpatient Medications Prior to Visit  Medication Sig Dispense Refill  . aspirin EC 81 MG tablet Take 1 tablet (81 mg total) by mouth daily.    [name] atorvastatin (LIPITOR) 80 MG tablet Take 80 mg by mouth daily.    . diphenhydrAMINE (BENADRYL) 25 MG tablet Take 25 mg by mouth every 6 (six) hours as needed.    . donepezil (ARICEPT) 5 MG tablet Take 5 mg by mouth at bedtime.    . irbesartan (AVAPRO) 150 MG tablet Take 1 tablet (150 mg total) by mouth daily. 30 tablet 11  . memantine (NAMENDA) 10 MG tablet Take 10 mg by mouth  2 (two) times daily.    . metFORMIN (GLUCOPHAGE) 500 MG tablet TAKE 1 TABLET (500 MG TOTAL) BY MOUTH DAILY.  5  . naproxen sodium (ANAPROX) 550 MG tablet Take 550 mg by mouth 2 (two) times daily as needed for mild pain.     . niacin (NIASPAN) 1000 MG CR tablet Take 1,000 mg by mouth at bedtime.    . Omega-3 Fatty Acids (FISH OIL) 1000 MG CAPS Take 1,000 mg by mouth 2 (two) times daily.    [name] omeprazole (PRILOSEC) 40 MG capsule Take 40 mg by mouth daily.    . propranolol ER (INDERAL LA) 120 MG 24 hr capsule Take 120 mg by mouth daily.    [name] Specialty Vitamins Products (PROSTATE) TABS Take 1 tablet by mouth daily.    . temazepam  (RESTORIL) 15 MG capsule Take 15 mg by mouth at bedtime.      No facility-administered medications prior to visit.      Allergies:   Penicillins and Sulfa antibiotics   Social History   Social History  . Marital status: Widowed    Spouse name: N/A  . Number of children: N/A  . Years of education: N/A   Social History Main Topics  . Smoking status: Current Every Day Smoker    Packs/day: 0.50    Years: 40.00    Types: Cigarettes  . Smokeless tobacco: Never Used  . Alcohol use No  . Drug use: No  . Sexual activity: Not Asked   Other Topics Concern  . None   Social History Narrative  . None     Family History:  The patient's family history includes Heart disease in his father.   ROS:   Please see the history of present illness.    Unexplained weight gain, ankle swelling, dyspnea on exertion slowly progressive over the past 3 months.  All other systems reviewed and are negative.   PHYSICAL EXAM:   VS:  BP (!) 134/98 (BP Location: Left Arm)   Pulse 61   Ht 6' 2"  (1.88 m)   Wt 173 lb 12.8 oz (78.8 kg)   BMI 22.31 kg/m    GEN: Well nourished, well developed, in no acute distress  HEENT: normal  Neck: no JVD, carotid bruits, or masses Cardiac: IRR; and a 4/6 crescendo decrescendo murmur of AS. No rubs, or gallops. There is bilateral ankle edema . Respiratory:  clear to auscultation bilaterally, normal work of breathing GI: soft, nontender, nondistended, + BS MS: no deformity or atrophy  Skin: warm and dry, no rash Neuro:  Alert and Oriented x 3, Strength and sensation are intact Psych: euthymic mood, full affect  Wt Readings from Last 3 Encounters:  06/18/17 173 lb 12.8 oz (78.8 kg)  06/14/17 173 lb 6.4 oz (78.7 kg)  05/28/17 170 lb (77.1 kg)      Studies/Labs Reviewed:   EKG:  EKG  LVH, sinus rhythm with ventricular bigeminy.  Recent Labs: 05/16/2017: BUN 18; Creatinine, Ser 1.10; Hemoglobin 13.2; Magnesium 1.9; Platelets 212; Potassium 4.6; Sodium 143    Lipid Panel    Component Value Date/Time   CHOL  11/07/2007 0305    88        ATP III CLASSIFICATION:  <200     mg/dL   Desirable  200-239  mg/dL   Borderline High  >=240    mg/dL   High   TRIG 49 11/07/2007 0305   HDL 41 11/07/2007 0305   CHOLHDL 2.1 11/07/2007 0305  VLDL 10 11/07/2007 0305   LDLCALC  11/07/2007 0305    37        Total Cholesterol/HDL:CHD Risk Coronary Heart Disease Risk Table                     Men   Women  1/2 Average Risk   3.4   3.3    Additional studies/ records that were reviewed today include:  Cardiac catheterization June 2018 Conclusion    Patent bypass grafts including SVG to PDA, SVG to diagonal, and SVG to obtuse marginal.  Patent LIMA to LAD.  Severe native vessel disease with total occlusion of the mid RCA, occlusion of the ostial to proximal circumflex, occlusion of the first diagonal/ramus intermedius, and diffuse mid LAD disease up to 80%. Second diagonal contains diffuse 80-90% obstruction in the branch. Distal left main is 50% obstructed.  Ventricular ectopy (bigeminy) made hemodynamic assessment difficult. Pulmonary pressures are post PVC recordings and higher than would be expected in normal sinus rhythm due to post PVC contractility augmentation.[name] Post PVC systolic pressure gradient is 67 mmHg. Pulmonary wedge pressure mean is 26 mmHg.  Aortic valve stenosis with valvular gradient also influenced by ventricular bigeminy. Peak to peak gradient of approximately 35 mmHg (using post PVC pressure wave forms), and calculated aortic valve area 1.27 cm  Previous documentation of normal left ventricular systolic function by recent echocardiogram with EF greater than 55%.  RECOMMENDATIONS:   Pulmonary evaluation to rule out COPD as an explanation for the patient's dyspnea.  Consider EP consultation to determine if ventricular ectopy could have a role in symptoms. We need a 24-48 hour monitor to quantitate the prevalence of  PVCs.  Consider referral for consideration of TAVR if no explanation for dyspnea can be identified. Mean pulmonary wedge pressure of 26 mmHg with an end-diastolic left ventricular pressure of 15 mmHg raises a question of mitral valve involvement which was not identified on echocardiography. Therefore likely a sampling error related to ventricular ectopy.      ASSESSMENT:    1. Dyspnea on exertion   2. Nonrheumatic aortic valve stenosis   3. Coronary artery disease involving coronary bypass graft of native heart without angina pectoris   4. Essential hypertension   5. Bilateral carotid artery disease ([place])   6. Cigarette smoker   7. CKD (chronic kidney disease), stage II   8. PVC (premature ventricular contraction)   9. Ventricular bigeminy      PLAN:  In order of problems listed above:  1. I believe this is all related to severe aortic stenosis. Will refer to the heart valve clinic. Hemodynamics at heart cath were influenced by the presence of ventricular bigeminy. May need to have a dobutamine echocardiogram done which may override PVCs and allow [place] to get a true transvalvular pressure gradient. Based on pulmonary evaluation, the patient has significant effusion abnormality but is not felt to have significant COPD. Perhaps this is related to decompensated pulmonary congestion. Furosemide 40 mg per day for 2-3 days then decrease to 20 mg per day thereafter. 2. Severe aortic stenosis based on clinical symptoms and findings to this point. Will refer for consideration of TAVR. 3. Patent bypass grafts. Ischemia does not appear to be influencing his complain of dyspnea on exertion. 4. Mildly elevated diastolic blood pressure. Low-dose diuretic therapy will be started and will hopefully help with shortness of breath. Furosemide is started. 10 mg of potassium started as well. 5. Not addressed 6. Encouraged not to  smoke again 7. Basic metabolic panel will be performed one week after starting  furosemide. 8. 24-hour Holter monitor will be done to quantitate PVC activity.  Probable severe symptomatic (dyspnea on exertion) calcific aortic stenosis. Referred to heart valve clinic for further management and hopefully TAVR.  Medication Adjustments/Labs and Tests Ordered: Current medicines are reviewed at length with the patient today.  Concerns regarding medicines are outlined above.  Medication changes, Labs and Tests ordered today are listed in the Patient Instructions below. Patient Instructions  Medication Instructions:  1) START Furosemide 41m once daily for two days, then decrease to 272monce daily. 2) START Potassium 1080monce daily  Labwork: Your physician recommends that you return for lab work in: 1 week (BMET)   Testing/Procedures: Your physician has recommended that you wear a 24 hour holter monitor. Holter monitors are medical devices that record the heart's electrical activity. Doctors most often use these monitors to diagnose arrhythmias. Arrhythmias are problems with the speed or rhythm of the heartbeat. The monitor is a small, portable device. You can wear one while you do your normal daily activities. This is usually used to diagnose what is causing palpitations/syncope (passing out).    Follow-Up: Your physician recommends that you schedule a follow-up appointment in: early September with Dr. SmiTamala Julian Any Other Special Instructions Will Be Listed Below (If Applicable).  You have been referred to Dr. CooBurt Knack Dr. McAAngelena Formr Valve surgery.    If you need a refill on your cardiac medications before your next appointment, please call your pharmacy.      Signed, HenSinclair GroomsD  06/18/2017 12:41 PM    [facility] [address]  [id number]: [phone]: [phone]

## 2017-06-18 ENCOUNTER — Ambulatory Visit: Payer: Medicare Other | Admitting: Physician Assistant

## 2017-06-18 ENCOUNTER — Ambulatory Visit (INDEPENDENT_AMBULATORY_CARE_PROVIDER_SITE_OTHER): Payer: Medicare Other | Admitting: Interventional Cardiology

## 2017-06-18 ENCOUNTER — Encounter: Payer: Self-pay | Admitting: Interventional Cardiology

## 2017-06-18 VITALS — BP 134/98 | HR 61 | Ht 74.0 in | Wt 173.8 lb

## 2017-06-18 DIAGNOSIS — N182 Chronic kidney disease, stage 2 (mild): Secondary | ICD-10-CM | POA: Diagnosis not present

## 2017-06-18 DIAGNOSIS — F17210 Nicotine dependence, cigarettes, uncomplicated: Secondary | ICD-10-CM

## 2017-06-18 DIAGNOSIS — R0609 Other forms of dyspnea: Secondary | ICD-10-CM | POA: Diagnosis not present

## 2017-06-18 DIAGNOSIS — I350 Nonrheumatic aortic (valve) stenosis: Secondary | ICD-10-CM | POA: Diagnosis not present

## 2017-06-18 DIAGNOSIS — I493 Ventricular premature depolarization: Secondary | ICD-10-CM

## 2017-06-18 DIAGNOSIS — I499 Cardiac arrhythmia, unspecified: Secondary | ICD-10-CM

## 2017-06-18 DIAGNOSIS — I25810 Atherosclerosis of coronary artery bypass graft(s) without angina pectoris: Secondary | ICD-10-CM

## 2017-06-18 DIAGNOSIS — I10 Essential (primary) hypertension: Secondary | ICD-10-CM | POA: Diagnosis not present

## 2017-06-18 DIAGNOSIS — I739 Peripheral vascular disease, unspecified: Secondary | ICD-10-CM

## 2017-06-18 DIAGNOSIS — I498 Other specified cardiac arrhythmias: Secondary | ICD-10-CM

## 2017-06-18 DIAGNOSIS — I779 Disorder of arteries and arterioles, unspecified: Secondary | ICD-10-CM

## 2017-06-18 MED ORDER — POTASSIUM CHLORIDE ER 10 MEQ PO TBCR: 10.0000 meq | EXTENDED_RELEASE_TABLET | Freq: Every day | ORAL | 3 refills | Status: DC

## 2017-06-18 MED ORDER — FUROSEMIDE 20 MG PO TABS: 20.0000 mg | ORAL_TABLET | Freq: Every day | ORAL | 3 refills | Status: DC

## 2017-06-18 NOTE — Patient Instructions (Addendum)
Medication Instructions:  1) START Furosemide 40mg  once daily for two days, then decrease to 20mg  once daily. 2) START Potassium 10mEq once daily  Labwork: Your physician recommends that you return for lab work in: 1 week (BMET)   Testing/Procedures: Your physician has recommended that you wear a 24 hour holter monitor. Holter monitors are medical devices that record the heart's electrical activity. Doctors most often use these monitors to diagnose arrhythmias. Arrhythmias are problems with the speed or rhythm of the heartbeat. The monitor is a small, portable device. You can wear one while you do your normal daily activities. This is usually used to diagnose what is causing palpitations/syncope (passing out).    Follow-Up: Your physician recommends that you schedule a follow-up appointment in: early September with Dr. Katrinka BlazingSmith.   Any Other Special Instructions Will Be Listed Below (If Applicable).  You have been referred to Dr. Excell Seltzerooper or Dr. Clifton JamesMcAlhany for Valve surgery.    If you need a refill on your cardiac medications before your next appointment, please call your pharmacy.

## 2017-06-25 ENCOUNTER — Other Ambulatory Visit: Payer: Medicare Other

## 2017-06-26 ENCOUNTER — Other Ambulatory Visit: Payer: Medicare Other

## 2017-06-26 NOTE — Progress Notes (Signed)
Valve Clinic Consult Note  Chief Complaint  Patient presents with  . New Patient (Initial Visit)    TAVR consult    Referring Provider: Dr. Daneen Schick  History of Present Illness: 83 yo male with history of CAD s/p CABG, SVT, carotid artery disease, DM, HTN, HLD, chronic kidney disease, carotid artery disease, mild COPD, PVCs and severe aortic stenosis who is here today for further evaluation of his aortic stenosis and for discussion regarding possible TAVR. He had an inferior STEMI in 2005 and following balloon angioplasty of the RCA underwent 4V CABG. He has had worsened dyspnea with exertion over the last few months. Workup in pulmonary clinic and not felt to have significant lung disease. Echo 05/25/17 with normal LV systolic function, moderately severe aortic stenosis. Cardiac cath 05/28/17 with severe native vessel CAD, 4/4 patent bypass grafts.   He tells me today that he has been having progressive dyspnea over the last 6 months. He has constant fatigue. He lost his wife in march 2018 after a long battle with pancreatic cancer. He started back smoking in March 2018. He has no plans to stop. He had lower extremity edema until Lasix was started several weeks ago. LE edema is better. No orthopnea or PND. He is a retried Investment banker, [employer]. He lives alone but has children.   Primary Care Physician:Bouska, Shanon Brow, MD Primary Cardiologist: Dr. Daneen Schick   Past Medical History:  Diagnosis Date  . Allergic rhinitis   . Aortic stenosis    a. Mild-mod by echo 01/2014. // b. Echo 6/18: Mod LVH EF 55-60, normal wall motion, Gr 2 DD, mod to severe AS (mean 40, peak 68; AVA by mean velocity 0.73 cm), mild AI, mod MAC, mild MR, severe LAE, mildly reduced RVSF, mod RAE  . CKD (chronic kidney disease), stage II   . Coronary artery disease    a. acute inferior MI s/p PTCA of RCA followed by CABGx4 in 2005,  . Diabetes mellitus ([place])   . ED (erectile dysfunction)   .  Hyperlipidemia   . Hypertension   . Irregular heartbeat 10/2016  . Left carotid stenosis   . Memory loss   . Osteoarthritis   . Reflux   . Tobacco abuse     Past Surgical History:  Procedure Laterality Date  . APPENDECTOMY    . CORONARY ARTERY BYPASS GRAFT    . ELBOW SURGERY    . RIGHT/LEFT HEART CATH AND CORONARY/GRAFT ANGIOGRAPHY N/A 05/28/2017   Procedure: Right/Left Heart Cath and Coronary/Graft Angiography;  Surgeon: Belva Crome, MD;  Location: [facility];  Service: Cardiovascular;  Laterality: N/A;    Current Outpatient Prescriptions  Medication Sig Dispense Refill  . aspirin EC 81 MG tablet Take 1 tablet (81 mg total) by mouth daily.    [name] atorvastatin (LIPITOR) 80 MG tablet Take 80 mg by mouth daily.    . diphenhydrAMINE (BENADRYL) 25 MG tablet Take 25 mg by mouth every 6 (six) hours as needed.    . donepezil (ARICEPT) 5 MG tablet Take 5 mg by mouth at bedtime.    . furosemide (LASIX) 20 MG tablet Take 1 tablet (20 mg total) by mouth daily. 90 tablet 3  . irbesartan (AVAPRO) 150 MG tablet Take 1 tablet (150 mg total) by mouth daily. 30 tablet 11  . memantine (NAMENDA) 10 MG tablet Take 10 mg by mouth 2 (two) times daily.    . metFORMIN (GLUCOPHAGE) 500 MG tablet TAKE 1 TABLET (  500 MG TOTAL) BY MOUTH DAILY.  5  . naproxen sodium (ANAPROX) 550 MG tablet Take 550 mg by mouth 2 (two) times daily as needed for mild pain.     . niacin (NIASPAN) 1000 MG CR tablet Take 1,000 mg by mouth at bedtime.    . Omega-3 Fatty Acids (FISH OIL) 1000 MG CAPS Take 1,000 mg by mouth 2 (two) times daily.    [name] omeprazole (PRILOSEC) 40 MG capsule Take 40 mg by mouth daily.    . potassium chloride (K-DUR) 10 MEQ tablet Take 1 tablet (10 mEq total) by mouth daily. 90 tablet 3  . propranolol ER (INDERAL LA) 120 MG 24 hr capsule Take 120 mg by mouth daily.    [name] Specialty Vitamins Products (PROSTATE) TABS Take 1 tablet by mouth daily.    . temazepam (RESTORIL) 15 MG capsule Take 15 mg by mouth  at bedtime.      No current facility-administered medications for this visit.     Allergies  Allergen Reactions  . Penicillins Swelling    feet and hands swell Has patient had a PCN reaction causing immediate rash, facial/tongue/throat swelling, SOB or lightheadedness with hypotension: No Has patient had a PCN reaction causing severe rash involving mucus membranes or skin necrosis: No Has patient had a PCN reaction that required hospitalization: No Has patient had a PCN reaction occurring within the last 10 years: No If all of the above answers are "NO", then may proceed with Cephalosporin use.  . Sulfa Antibiotics Itching    Possible allergy    Social History   Social History  . Marital status: Widowed    Spouse name: N/A  . Number of children: 3  . Years of education: N/A   Occupational History  . Professor at [employer]    Social History Main Topics  . Smoking status: Current Every Day Smoker    Packs/day: 0.50    Years: 40.00    Types: Cigarettes  . Smokeless tobacco: Never Used  . Alcohol use No  . Drug use: No  . Sexual activity: Not on file   Other Topics Concern  . Not on file   Social History Narrative  . No narrative on file    Family History  Problem Relation Age of Onset  . Heart disease Father     Review of Systems:  As stated in the HPI and otherwise negative.   BP 120/70   Pulse (!) 35   Ht 6' 2"  (1.88 m)   Wt 163 lb (73.9 kg)   SpO2 98%   BMI 20.93 kg/m   Physical Examination: General: Well developed, well nourished, NAD  HEENT: OP clear, mucus membranes moist  SKIN: warm, dry. No rashes. Neuro: No focal deficits  Musculoskeletal: Muscle strength 5/5 all ext  Psychiatric: Mood and affect normal  Neck: No JVD, no carotid bruits, no thyromegaly, no lymphadenopathy.  Lungs:Clear bilaterally, no wheezes, rhonci, crackles Cardiovascular: Regular rate and rhythm. Loud harsh systolic murmur.  Abdomen:Soft. Bowel sounds present.  Non-tender.  Extremities: No lower extremity edema. Pulses are 2 + in the bilateral DP/PT.  Echo 05/25/17: Left ventricle: The cavity size was normal. Wall thickness was   increased in a pattern of moderate LVH. Systolic function was   normal. The estimated ejection fraction was in the range of 55%   to 60%. Wall motion was normal; there were no regional wall   motion abnormalities. Features are consistent with a pseudonormal   left ventricular filling pattern,  with concomitant abnormal   relaxation and increased filling pressure (grade 2 diastolic   dysfunction). - Aortic valve: Moderately calcified annulus. Moderately thickened,   severely calcified leaflets. Cusp separation was severely   reduced. There was moderate to severe stenosis. There was mild   regurgitation. Valve area (VTI): 0.86 cm^2. Valve area (Vmax):   0.87 cm^2. Valve area (Vmean): 0.73 cm^2. - Mitral valve: Mildly to moderately calcified annulus. There was   mild regurgitation. - Left atrium: The atrium was severely dilated. - Right ventricle: Systolic function was mildly reduced. - Right atrium: The atrium was moderately dilated.  Cardiac cath 05/28/17: Conclusion    Patent bypass grafts including SVG to PDA, SVG to diagonal, and SVG to obtuse marginal.  Patent LIMA to LAD.  Severe native vessel disease with total occlusion of the mid RCA, occlusion of the ostial to proximal circumflex, occlusion of the first diagonal/ramus intermedius, and diffuse mid LAD disease up to 80%. Second diagonal contains diffuse 80-90% obstruction in the branch. Distal left main is 50% obstructed.  Ventricular ectopy (bigeminy) made hemodynamic assessment difficult. Pulmonary pressures are post PVC recordings and higher than would be expected in normal sinus rhythm due to post PVC contractility augmentation.[name] Post PVC systolic pressure gradient is 67 mmHg. Pulmonary wedge pressure mean is 26 mmHg.  Aortic valve stenosis with valvular  gradient also influenced by ventricular bigeminy. Peak to peak gradient of approximately 35 mmHg (using post PVC pressure wave forms), and calculated aortic valve area 1.27 cm  Previous documentation of normal left ventricular systolic function by recent echocardiogram with EF greater than 55%.  RECOMMENDATIONS:   Pulmonary evaluation to rule out COPD as an explanation for the patient's dyspnea.  Consider EP consultation to determine if ventricular ectopy could have a role in symptoms. We need a 24-48 hour monitor to quantitate the prevalence of PVCs.  Consider referral for consideration of TAVR if no explanation for dyspnea can be identified. Mean pulmonary wedge pressure of 26 mmHg with an end-diastolic left ventricular pressure of 15 mmHg raises a question of mitral valve involvement which was not identified on echocardiography. Therefore likely a sampling error related to ventricular ectopy.    Coronary Diagrams   Diagnostic Diagram       Implants     No implant documentation for this case.  PACS Images   Show images for Cardiac catheterization   Link to Procedure Log   Procedure Log    Hemo Data    Most Recent Value  Fick Cardiac Output 4.16 L/min  Fick Cardiac Output Index 2.06 (L/min)/BSA  Aortic Mean Gradient 32.8 mmHg  Aortic Peak Gradient 29 mmHg  Aortic Valve Area 1.27  Aortic Value Area Index 0.63 cm2/BSA  RA A Wave 10 mmHg  RA V Wave 11 mmHg  RA Mean 7 mmHg  RV Systolic Pressure 66 mmHg  RV Diastolic Pressure 1 mmHg  RV EDP 7 mmHg  PA Systolic Pressure 67 mmHg  PA Diastolic Pressure 20 mmHg  PA Mean 40 mmHg  PW A Wave 38 mmHg  PW V Wave 0 mmHg  PW Mean 26 mmHg  AO Systolic Pressure 361 mmHg  AO Diastolic Pressure 63 mmHg  AO Mean 95 mmHg  LV Systolic Pressure 443 mmHg  LV Diastolic Pressure 6 mmHg  LV EDP 11 mmHg  Arterial Occlusion Pressure Extended Systolic Pressure 154 mmHg  Arterial Occlusion Pressure Extended Diastolic Pressure 60 mmHg    Arterial Occlusion Pressure Extended Mean Pressure 88 mmHg  Left Ventricular  Apex Extended Systolic Pressure 638 mmHg  Left Ventricular Apex Extended Diastolic Pressure 10 mmHg  Left Ventricular Apex Extended EDP Pressure 15 mmHg  QP/QS 1  TPVR Index 19.44 HRUI  TSVR Index 43.25 HRUI  PVR SVR Ratio 0.17  TPVR/TSVR Ratio 0.45     EKG:  EKG is ordered today. The ekg ordered today demonstrates Sinus, frequent PVCs with bigeminy. LAFB.   Recent Labs: 05/16/2017: BUN 18; Creatinine, Ser 1.10; Hemoglobin 13.2; Magnesium 1.9; Platelets 212; Potassium 4.6; Sodium 143   Lipid   Wt Readings from Last 3 Encounters:  06/27/17 163 lb (73.9 kg)  06/18/17 173 lb 12.8 oz (78.8 kg)  06/14/17 173 lb 6.4 oz (78.7 kg)     Other studies Reviewed: Additional studies/ records that were reviewed today include: echo and cath images Review of the above records demonstrates: severe AS, stable CAD   STS Risk Score Risk of Mortality: 6.635%  Morbidity or Mortality: 26.641%  Long Length of Stay: 11.997%  Short Length of Stay: 20.805%  Permanent Stroke: 2.515%  Prolonged Ventilation: 16.357%  DSW Infection: 0.502%  Renal Failure: 9.862%  Reoperation: 9.73%    Assessment and Plan:   1. Severe aortic valve stenosis: He has stage D symptomatic aortic valve stenosis. I have personally reviewed the echo images. The aortic valve is thickened, calcified with limited leaflet mobility. I think he would benefit from AVR. Given advanced age and prior CABG, he is not a good candidate for conventional AVR by surgical approach. I think he may be a good candidate for TAVR. He is a very functional 83 yo patient. I have reviewed the TAVR procedure in detail today with the patient. He would like to proceed with planning for TAVR. He has had PFTs in May 2018 and carotid dopplers in June 2018. He will need cardiac CT and CTA of the chest/abd/pelvis. I will arrange f/u with Dr. Prescott Gum for first surgical opinion since he  performed the CABG. He will then need to see Dr. Cyndia Bent or Dr. Roxy Manns on our TAVR team. Risks and benefits of procedure reviewed with the patient.      Current medicines are reviewed at length with the patient today.  The patient does not have concerns regarding medicines.  The following changes have been made:  no change  Labs/ tests ordered today include:   Orders Placed This Encounter  Procedures  . EKG 12-Lead     Disposition:   FU with the TAVR team   Signed, Lauree Chandler, MD 06/27/2017 1:43 PM    [facility] [place], [place], [place]  [address] Phone: [phone]; Fax: [phone]

## 2017-06-27 ENCOUNTER — Ambulatory Visit (INDEPENDENT_AMBULATORY_CARE_PROVIDER_SITE_OTHER): Payer: Medicare Other | Admitting: Cardiovascular Disease

## 2017-06-27 ENCOUNTER — Other Ambulatory Visit: Payer: Self-pay | Admitting: *Deleted

## 2017-06-27 ENCOUNTER — Ambulatory Visit (INDEPENDENT_AMBULATORY_CARE_PROVIDER_SITE_OTHER): Payer: Medicare Other

## 2017-06-27 ENCOUNTER — Encounter: Payer: Self-pay | Admitting: Cardiovascular Disease

## 2017-06-27 ENCOUNTER — Other Ambulatory Visit: Payer: Medicare Other | Admitting: *Deleted

## 2017-06-27 VITALS — BP 120/70 | HR 35 | Ht 74.0 in | Wt 163.0 lb

## 2017-06-27 DIAGNOSIS — I350 Nonrheumatic aortic (valve) stenosis: Secondary | ICD-10-CM

## 2017-06-27 DIAGNOSIS — I493 Ventricular premature depolarization: Secondary | ICD-10-CM

## 2017-06-27 DIAGNOSIS — I25810 Atherosclerosis of coronary artery bypass graft(s) without angina pectoris: Secondary | ICD-10-CM

## 2017-06-27 NOTE — Progress Notes (Signed)
Valve Clinic Consult Note  Chief Complaint  Patient presents with  . New Patient (Initial Visit)    TAVR consult    Referring Provider: Dr. Daneen Schick  History of Present Illness: 83 yo male with history of CAD s/p 4V CABG in 2005, SVT, carotid artery disease, DM, HTN, HLD, chronic kidney disease and severe aortic stenosis who is here today for further evaluation of his aortic stenosis and for discussion regarding possible TAVR. He had an inferior STEMI in 2005 and following balloon angioplasty of the RCA underwent 4V CABG. He has had worsened dyspnea with exertion over the last few months. Workup in pulmonary clinic and not felt to have significant lung disease. Echo 05/25/17 with normal LV systolic function, severe aortic stenosis with mean gradient 40 mmHg, peak gradient 68mHg, AVA 0.86 cm2. Cardiac cath 05/28/17 with severe native vessel CAD, 4/4 patent bypass grafts. Peak to peak gradient across the aortic valve was 35 mmHg. AVA calculated at 1.27 cm2.  He tells me today that he has been having more dyspnea with exertion over the last 6 months. He lost his wife to pancreatic cancer in March 2018. Since then he has no energy and cannot exercise for as long as he could prior to her death. He has no lower extremity edema since starting Lasix several weeks ago. He has no orthopnea or PND. He is aware of palpitations. He is known to have PVCs. He lives alone.   Primary Care Physician:Bouska, DShanon Brow MD Primary Cardiologist: Dr. HDaneen Schick  Past Medical History:  Diagnosis Date  . Allergic rhinitis   . Aortic stenosis    a. Mild-mod by echo 01/2014. // b. Echo 6/18: Mod LVH EF 55-60, normal wall motion, Gr 2 DD, mod to severe AS (mean 40, peak 68; AVA by mean velocity 0.73 cm), mild AI, mod MAC, mild MR, severe LAE, mildly reduced RVSF, mod RAE  . CKD (chronic kidney disease), stage II   . Coronary artery disease    a. acute inferior MI s/p PTCA of RCA followed by CABGx4 in 2005,  .  [place]   . ED (erectile dysfunction)   . Hyperlipidemia   . Hypertension   . Irregular heartbeat 10/2016  . Left carotid stenosis   . Memory loss   . Osteoarthritis   . Reflux   . Tobacco abuse     Past Surgical History:  Procedure Laterality Date  . APPENDECTOMY    . CORONARY ARTERY BYPASS GRAFT    . ELBOW SURGERY    . RIGHT/LEFT HEART CATH AND CORONARY/GRAFT ANGIOGRAPHY N/A 05/28/2017   Procedure: Right/Left Heart Cath and Coronary/Graft Angiography;  Surgeon: SBelva Crome MD;  [facility];  Service: Cardiovascular;  Laterality: N/A;    Current Outpatient Prescriptions  Medication Sig Dispense Refill  . aspirin EC 81 MG tablet Take 1 tablet (81 mg total) by mouth daily.    .Marland Kitchenatorvastatin (LIPITOR) 80 MG tablet Take 80 mg by mouth daily.    . diphenhydrAMINE (BENADRYL) 25 MG tablet Take 25 mg by mouth every 6 (six) hours as needed.    . donepezil (ARICEPT) 5 MG tablet Take 5 mg by mouth at bedtime.    . furosemide (LASIX) 20 MG tablet Take 1 tablet (20 mg total) by mouth daily. 90 tablet 3  . irbesartan (AVAPRO) 150 MG tablet Take 1 tablet (150 mg total) by mouth daily. 30 tablet 11  . memantine (NAMENDA) 10 MG tablet Take 10  mg by mouth 2 (two) times daily.    . metFORMIN (GLUCOPHAGE) 500 MG tablet TAKE 1 TABLET (500 MG TOTAL) BY MOUTH DAILY.  5  . naproxen sodium (ANAPROX) 550 MG tablet Take 550 mg by mouth 2 (two) times daily as needed for mild pain.     . niacin (NIASPAN) 1000 MG CR tablet Take 1,000 mg by mouth at bedtime.    . Omega-3 Fatty Acids (FISH OIL) 1000 MG CAPS Take 1,000 mg by mouth 2 (two) times daily.    [name] omeprazole (PRILOSEC) 40 MG capsule Take 40 mg by mouth daily.    . potassium chloride (K-DUR) 10 MEQ tablet Take 1 tablet (10 mEq total) by mouth daily. 90 tablet 3  . propranolol ER (INDERAL LA) 120 MG 24 hr capsule Take 120 mg by mouth daily.    [name] Specialty Vitamins Products (PROSTATE) TABS Take 1 tablet by mouth daily.     . temazepam (RESTORIL) 15 MG capsule Take 15 mg by mouth at bedtime.      No current facility-administered medications for this visit.     Allergies  Allergen Reactions  . Penicillins Swelling    feet and hands swell Has patient had a PCN reaction causing immediate rash, facial/tongue/throat swelling, SOB or lightheadedness with hypotension: No Has patient had a PCN reaction causing severe rash involving mucus membranes or skin necrosis: No Has patient had a PCN reaction that required hospitalization: No Has patient had a PCN reaction occurring within the last 10 years: No If all of the above answers are then may proceed with Cephalosporin use.  . Sulfa Antibiotics Itching    Possible allergy    Social History   Social History  . Marital status: Widowed    Spouse name: N/A  . Number of children: 3  . Years of education: N/A   Occupational History  . Professor at [employer]    Social History Main Topics  . Smoking status: Current Every Day Smoker    Packs/day: 0.50    Years: 40.00    Types: Cigarettes  . Smokeless tobacco: Never Used  . Alcohol use No  . Drug use: No  . Sexual activity: Not on file   Other Topics Concern  . Not on file   Social History Narrative  . No narrative on file    Family History  Problem Relation Age of Onset  . Heart disease Father     Review of Systems:  As stated in the HPI and otherwise negative.   BP 120/70   Pulse (!) 35   Ht _0  (1.88 m)   Wt 163 lb (73.9 kg)   SpO2 98%   BMI 20.93 kg/m   Physical Examination: General: Well developed, well nourished, NAD  HEENT: OP clear, mucus membranes moist  SKIN: warm, dry. No rashes. Neuro: No focal deficits  Musculoskeletal: Muscle strength 5/5 all ext  Psychiatric: Mood and affect normal  Neck: No JVD, no carotid bruits, no thyromegaly, no lymphadenopathy.  Lungs:Clear bilaterally, no wheezes, rhonci, crackles Cardiovascular: Regular rate and rhythm. Loud harsh  systolic murmur.  Abdomen:Soft. Bowel sounds present. Non-tender.  Extremities: No lower extremity edema. Pulses are 2 + in the bilateral DP/PT.  Echo 05/25/17: Left ventricle: The cavity size was normal. Wall thickness was   increased in a pattern of moderate LVH. Systolic function was   normal. The estimated ejection fraction was in the range of 55%   to 60%. Wall motion was normal; there were no  regional wall   motion abnormalities. Features are consistent with a pseudonormal   left ventricular filling pattern, with concomitant abnormal   relaxation and increased filling pressure (grade 2 diastolic   dysfunction). - Aortic valve: Moderately calcified annulus. Moderately thickened,   severely calcified leaflets. Cusp separation was severely   reduced. There was moderate to severe stenosis. There was mild   regurgitation. Valve area (VTI): 0.86 cm^2. Valve area (Vmax):   0.87 cm^2. Valve area (Vmean): 0.73 cm^2. - Mitral valve: Mildly to moderately calcified annulus. There was   mild regurgitation. - Left atrium: The atrium was severely dilated. - Right ventricle: Systolic function was mildly reduced. - Right atrium: The atrium was moderately dilated.  Cardiac cath 05/28/17: Conclusion    Patent bypass grafts including SVG to PDA, SVG to diagonal, and SVG to obtuse marginal.  Patent LIMA to LAD.  Severe native vessel disease with total occlusion of the mid RCA, occlusion of the ostial to proximal circumflex, occlusion of the first diagonal/ramus intermedius, and diffuse mid LAD disease up to 80%. Second diagonal contains diffuse 80-90% obstruction in the branch. Distal left main is 50% obstructed.  Ventricular ectopy (bigeminy) made hemodynamic assessment difficult. Pulmonary pressures are post PVC recordings and higher than would be expected in normal sinus rhythm due to post PVC contractility augmentation.[name] Post PVC systolic pressure gradient is 67 mmHg. Pulmonary wedge pressure  mean is 26 mmHg.  Aortic valve stenosis with valvular gradient also influenced by ventricular bigeminy. Peak to peak gradient of approximately 35 mmHg (using post PVC pressure wave forms), and calculated aortic valve area 1.27 cm  Previous documentation of normal left ventricular systolic function by recent echocardiogram with EF greater than 55%.  RECOMMENDATIONS:   Pulmonary evaluation to rule out COPD as an explanation for the patient's dyspnea.  Consider EP consultation to determine if ventricular ectopy could have a role in symptoms. We need a 24-48 hour monitor to quantitate the prevalence of PVCs.  Consider referral for consideration of TAVR if no explanation for dyspnea can be identified. Mean pulmonary wedge pressure of 26 mmHg with an end-diastolic left ventricular pressure of 15 mmHg raises a question of mitral valve involvement which was not identified on echocardiography. Therefore likely a sampling error related to ventricular ectopy.    Coronary Diagrams   Diagnostic Diagram       Implants     No implant documentation for this case.  PACS Images   Show images for Cardiac catheterization   Link to Procedure Log   Procedure Log    Hemo Data    Most Recent Value  Fick Cardiac Output 4.16 L/min  Fick Cardiac Output Index 2.06 (L/min)/BSA  Aortic Mean Gradient 32.8 mmHg  Aortic Peak Gradient 29 mmHg  Aortic Valve Area 1.27  Aortic Value Area Index 0.63 cm2/BSA  RA A Wave 10 mmHg  RA V Wave 11 mmHg  RA Mean 7 mmHg  RV Systolic Pressure 66 mmHg  RV Diastolic Pressure 1 mmHg  RV EDP 7 mmHg  PA Systolic Pressure 67 mmHg  PA Diastolic Pressure 20 mmHg  PA Mean 40 mmHg  PW A Wave 38 mmHg  PW V Wave 0 mmHg  PW Mean 26 mmHg  AO Systolic Pressure 400 mmHg  AO Diastolic Pressure 63 mmHg  AO Mean 95 mmHg  LV Systolic Pressure 867 mmHg  LV Diastolic Pressure 6 mmHg  LV EDP 11 mmHg  Arterial Occlusion Pressure Extended Systolic Pressure 619 mmHg  Arterial  Occlusion Pressure  Extended Diastolic Pressure 60 mmHg  Arterial Occlusion Pressure Extended Mean Pressure 88 mmHg  Left Ventricular Apex Extended Systolic Pressure 833 mmHg  Left Ventricular Apex Extended Diastolic Pressure 10 mmHg  Left Ventricular Apex Extended EDP Pressure 15 mmHg  QP/QS 1  TPVR Index 19.44 HRUI  TSVR Index 43.25 HRUI  PVR SVR Ratio 0.17  TPVR/TSVR Ratio 0.45     EKG:  EKG is ordered today. The ekg ordered today demonstrates Sinus rhythm, rate 68 bpm. Frequent PVCs in pattern of bigeminy. LAFB.   Recent Labs: 05/16/2017: BUN 18; Creatinine, Ser 1.10; Hemoglobin 13.2; Magnesium 1.9; Platelets 212; Potassium 4.6; Sodium 143    Wt Readings from Last 3 Encounters:  06/27/17 163 lb (73.9 kg)  06/18/17 173 lb 12.8 oz (78.8 kg)  06/14/17 173 lb 6.4 oz (78.7 kg)    Other studies Reviewed: Additional studies/ records that were reviewed today include: Echo, cath, office notes.  Review of the above records demonstrates: severe AS, stable CAD   STS Risk Score Risk of Mortality: 6.635%  Morbidity or Mortality: 26.641%  Long Length of Stay: 11.997%  Short Length of Stay: 20.805%  Permanent Stroke: 2.515%  Prolonged Ventilation: 16.357%  DSW Infection: 0.502%  Renal Failure: 9.862%  Reoperation: 9.73%    Assessment and Plan:   1. Severe aortic valve stenosis: He has stage D symptomatic aortic valve stenosis. I have personally reviewed the echo images. The aortic valve is thickened, calcified with limited leaflet mobility. I think he would benefit from AVR. Given advanced age and prior open chest surgery, he is not a good candidate for conventional AVR by surgical approach. I think he may be a good candidate for TAVR. He is a very functional 83 yo patient. I have reviewed the TAVR procedure in detail today with the patient. He would like to proceed with planning for TAVR. He has completed his cardiac cath, PFTs in June 2018 and carotid artery dopplers in May 2018. He  will need to see Dr. Prescott Gum who performed his CABG and then either Dr. Roxy Manns or Dr. Cyndia Bent on our TAVR team. Risks and benefits of the TAVR procedure reviewed with the patient. He will need to have a cardiac CT and CTA of the chest/abd/pelvis. Will then set a date for TAVR.     Current medicines are reviewed at length with the patient today.  The patient does not have concerns regarding medicines.  The following changes have been made:  no change  Labs/ tests ordered today include:   Orders Placed This Encounter  Procedures  . EKG 12-Lead     Disposition:   FU with our TAVR team.    Signed, Lauree Chandler, MD 06/27/2017 2:16 PM    [address], [place], [place]  [address] Phone: [phone]; Fax: [phone]

## 2017-06-27 NOTE — Patient Instructions (Signed)
Medication Instructions:  Your physician recommends that you continue on your current medications as directed. Please refer to the Current Medication list given to you today.   Labwork: none  Testing/Procedures: none  Follow-Up: You have been referred to [facility].  Their office with contact you with appointment information.   Any Other Special Instructions Will Be Listed Below (If Applicable).     If you need a refill on your cardiac medications before your next appointment, please call your pharmacy.

## 2017-06-28 LAB — BASIC METABOLIC PANEL
BUN/Creatinine Ratio: 18 (ref 10–24)
BUN: 25 mg/dL (ref 8–27)
CALCIUM: 9.1 mg/dL (ref 8.6–10.2)
CO2: 24 mmol/L (ref 20–29)
Chloride: 103 mmol/L (ref 96–106)
Creatinine, Ser: 1.38 mg/dL — ABNORMAL HIGH (ref 0.76–1.27)
GFR, EST AFRICAN AMERICAN: 55 mL/min/{1.73_m2} — AB (ref >59)
GFR, EST NON AFRICAN AMERICAN: 48 mL/min/{1.73_m2} — AB (ref >59)
Glucose: 103 mg/dL — ABNORMAL HIGH (ref 65–99)
POTASSIUM: 4.3 mmol/L (ref 3.5–5.2)
Sodium: 145 mmol/L — ABNORMAL HIGH (ref 134–144)

## 2017-07-02 ENCOUNTER — Telehealth: Payer: Self-pay | Admitting: Interventional Cardiology

## 2017-07-02 NOTE — Telephone Encounter (Signed)
Lm to call back ./cy 

## 2017-07-02 NOTE — Telephone Encounter (Signed)
New message    Pt is returning call from last week about lab results.

## 2017-07-02 NOTE — Telephone Encounter (Signed)
Pt aware of lab results but was confused about upcoming appts reviewed all that was listed and if has further questions to call back .Pt agrees ./cy

## 2017-07-03 ENCOUNTER — Ambulatory Visit: Payer: Medicare Other | Admitting: Physician Assistant

## 2017-07-09 ENCOUNTER — Ambulatory Visit (HOSPITAL_COMMUNITY): Payer: Medicare Other

## 2017-07-09 ENCOUNTER — Ambulatory Visit: Payer: Medicare Other | Admitting: Interventional Cardiology

## 2017-07-10 ENCOUNTER — Institutional Professional Consult (permissible substitution) (INDEPENDENT_AMBULATORY_CARE_PROVIDER_SITE_OTHER): Payer: Medicare Other | Admitting: Cardiothoracic Surgery

## 2017-07-10 ENCOUNTER — Encounter: Payer: Self-pay | Admitting: Cardiothoracic Surgery

## 2017-07-10 VITALS — BP 144/80 | HR 42 | Resp 16 | Ht 74.0 in | Wt 165.0 lb

## 2017-07-10 DIAGNOSIS — Z951 Presence of aortocoronary bypass graft: Secondary | ICD-10-CM

## 2017-07-10 DIAGNOSIS — I350 Nonrheumatic aortic (valve) stenosis: Secondary | ICD-10-CM | POA: Diagnosis not present

## 2017-07-10 NOTE — Progress Notes (Signed)
PCP is Bernerd Limbo, MD Referring Provider is Belva Crome, MD  Chief Complaint  Patient presents with  . Aortic Stenosis    Surgical eval for possible TAVR, HX of CABG  Patient examined, most recent cardiac catheterization and 2-D echocardiogram images personally reviewed and counseled with patient. Recent PFTs and pulmonary consult information and medical data personally reviewed  HPI: Very nice 83 year old Caucasian male smoker presents for evaluation of recently diagnosed severe aortic stenosis and symptoms of progressive shortness of breath, exercise intolerance, weight loss, and fatigue. In 2005 the patient underwent CABG 4. He has had no recurrent angina. As part of his evaluation for shortness of breath he underwent repeat left heart catheterization which showed patent mammary artery and patent vein grafts to the LAD diagonal circumflex and posterior descending. Native coronary disease is severe. Systolic function is normal.  Patient's echocardiogram shows severe aortic valve calcification and limited mobility with a reduced In a valve area of 0.75 and calculated peak gradient 64 mmHg and a mean gradient of 40 mmHg. The patient has mild aortic insufficiency, no significant mitral regurgitation. Right heart catheterization demonstrates elevated wedge of 24 mmHg.  The patient has extreme limitation to his exercise. He gets up in the morning shaves and dresses and then has to sit and rest. When he pushes the trash can up the driveway he has to stop at least once to recover. No associated syncope or dizziness, no associated chest pain.  The patient has had a significant problem weight loss as well. Since January of this year he is lost 10-15 pounds. In the past few years he has lost over 30 pounds he states. His wife died of pancreatic cancer 4 months ago. He had stopped smoking until she had terminal disease and then resumed smoking 1-10 cigarettes a day  Patient does complain of problems  with swallowing and dysphagia that should be investigated with a barium swallow. He states he is never had to make himself regurgitate food but he will have to stop stand drink water or swallow bread to have things such as a apple peel pass.  The patient was seen by pulmonary to evaluate his severe dyspnea on exertion. PFT mechanics are satisfactory. Diffusion capacity is reduced but corrected is 46% of predicted. He has no evidence of pulmonary hypertension on his hemodynamic studies. Pulmonary did not feel the patient had significant pulmonary disease to explain his shortness of breath which was most consistent with CHF. He was advised to stop taking his ACE inhibitor and was changed to a ARB-Avapro  Past Medical History:  Diagnosis Date  . Allergic rhinitis   . Aortic stenosis    a. Mild-mod by echo 01/2014. // b. Echo 6/18: Mod LVH EF 55-60, normal wall motion, Gr 2 DD, mod to severe AS (mean 40, peak 68; AVA by mean velocity 0.73 cm), mild AI, mod MAC, mild MR, severe LAE, mildly reduced RVSF, mod RAE  . CKD (chronic kidney disease), stage II   . Coronary artery disease    a. acute inferior MI s/p PTCA of RCA followed by CABGx4 in 2005,  . Diabetes mellitus ([place])   . ED (erectile dysfunction)   . Hyperlipidemia   . Hypertension   . Irregular heartbeat 10/2016  . Left carotid stenosis   . Memory loss   . Osteoarthritis   . Reflux   . Tobacco abuse     Past Surgical History:  Procedure Laterality Date  . APPENDECTOMY    . CORONARY  ARTERY BYPASS GRAFT    . ELBOW SURGERY    . RIGHT/LEFT HEART CATH AND CORONARY/GRAFT ANGIOGRAPHY N/A 05/28/2017   Procedure: Right/Left Heart Cath and Coronary/Graft Angiography;  Surgeon: Belva Crome, MD;  Location: [facility];  Service: Cardiovascular;  Laterality: N/A;    Family History  Problem Relation Age of Onset  . Heart disease Father     Social History Social History  Substance Use Topics  . Smoking status: Current Every Day  Smoker    Packs/day: 0.50    Years: 40.00    Types: Cigarettes  . Smokeless tobacco: Never Used  . Alcohol use No    Current Outpatient Prescriptions  Medication Sig Dispense Refill  . aspirin EC 81 MG tablet Take 1 tablet (81 mg total) by mouth daily.    [name] atorvastatin (LIPITOR) 80 MG tablet Take 80 mg by mouth daily.    . diphenhydrAMINE (BENADRYL) 25 MG tablet Take 25 mg by mouth every 6 (six) hours as needed.    . donepezil (ARICEPT) 5 MG tablet Take 5 mg by mouth at bedtime.    . furosemide (LASIX) 20 MG tablet Take 1 tablet (20 mg total) by mouth daily. 90 tablet 3  . irbesartan (AVAPRO) 150 MG tablet Take 1 tablet (150 mg total) by mouth daily. 30 tablet 11  . memantine (NAMENDA) 10 MG tablet Take 10 mg by mouth 2 (two) times daily.    . metFORMIN (GLUCOPHAGE) 500 MG tablet TAKE 1 TABLET (500 MG TOTAL) BY MOUTH DAILY.  5  . naproxen sodium (ANAPROX) 550 MG tablet Take 550 mg by mouth 2 (two) times daily as needed for mild pain.     . niacin (NIASPAN) 1000 MG CR tablet Take 1,000 mg by mouth at bedtime.    . Omega-3 Fatty Acids (FISH OIL) 1000 MG CAPS Take 1,000 mg by mouth 2 (two) times daily.    [name] omeprazole (PRILOSEC) 40 MG capsule Take 40 mg by mouth daily.    . potassium chloride (K-DUR) 10 MEQ tablet Take 1 tablet (10 mEq total) by mouth daily. 90 tablet 3  . propranolol ER (INDERAL LA) 120 MG 24 hr capsule Take 120 mg by mouth daily.    [name] Specialty Vitamins Products (PROSTATE) TABS Take 1 tablet by mouth daily.    . temazepam (RESTORIL) 15 MG capsule Take 15 mg by mouth at bedtime.      No current facility-administered medications for this visit.     Allergies  Allergen Reactions  . Penicillins Swelling    feet and hands swell Has patient had a PCN reaction causing immediate rash, facial/tongue/throat swelling, SOB or lightheadedness with hypotension: No Has patient had a PCN reaction causing severe rash involving mucus membranes or skin necrosis: No Has patient had a  PCN reaction that required hospitalization: No Has patient had a PCN reaction occurring within the last 10 years: No If all of the above answers are "NO", then may proceed with Cephalosporin use.  . Sulfa Antibiotics Itching    Possible allergy    Review of Systems         Review of Systems :  [ y ] = yes, [  ] = no        General :  Weight gain [   ]    Weight loss  [place]   ]  Fatigue [  ]  Fever [  ]  Chills  [  ]  fatigue yes                         Weakness  [  ]           HEENT    Headache [  ]  Dizziness [  ]  Blurred vision [  ] Glaucoma  [  ]                          Nosebleeds [  ] Painful or loose teeth [  ]        Cardiac :  Chest pain/ pressure [  ]  Resting SOB [  ] exertional SOB [ yes ]                        Orthopnea [  ]  Pedal edema  [  ]  Palpitations [  ] Syncope/presyncope _0                         Paroxysmal nocturnal dyspnea [  ]         Pulmonary : cough [  ]  wheezing [  ]  Hemoptysis [  ] Sputum [  ] Snoring [  ]                              Pneumothorax [  ]  Sleep apnea [  ]        GI : Vomiting [  ]  Dysphagia [place]  ]  Melena  [  ]  Abdominal pain [  ] BRBPR [  ]              Heart burn [  ]  Constipation [  ] Diarrhea  [  ] Colonoscopy [   ]        GU : Hematuria [  ]  Dysuria [  ]  Nocturia [  ] UTI's [  ]        Vascular : Claudication [  ]  Rest pain [  ]  DVT [  ] Vein stripping [  ] leg ulcers [  ]                          TIA [  ] Stroke [  ]  Varicose veins [  ]        NEURO :  Headaches  [  ] Seizures [  ] Vision changes [  ] Paresthesias [  ]                                       Seizures [  ]        Musculoskeletal :  Arthritis [  ] Gout  [  ]  Back pain [  ]  Joint pain [  ]        Skin :  Rash [  ]  Melanoma [  ] Sores [  ]        Heme : Bleeding problems [  ]Clotting Disorders [  ] Anemia [  ]Blood Transfusion _1         Endocrine : Diabetes [  ] Heat  or Cold intolerance [  ] Polyuria [  ]excessive thirst _0         Psych :  Depression [ yes from recent death of wife ]  Anxiety [  ]  Psych hospitalizations [  ] Memory change [  ]                                               BP (!) 144/80 (BP Location: Left Arm, Patient Position: Sitting, Cuff Size: Large)   Pulse (!) 42   Resp 16   Ht _1  (1.88 m)   Wt 165 lb (74.8 kg)   SpO2 98% Comment: RA  BMI 21.18 kg/m  Physical Exam     Physical Exam  General: Thin elderly Caucasian male who appears to be chronically ill but not acutely ill HEENT: Normocephalic pupils equal , dentition adequate Neck: Supple without JVD, adenopathy, or bruit Chest: Clear to auscultation, symmetrical breath sounds, no rhonchi, no tenderness             or deformity. Well-healed sternotomy incision. Cardiovascular: Regular rate and rhythm, but with PVCs,  3/6 AS murmur, no gallop, peripheral pulses             palpable in all extremities Abdomen:  Soft, nontender, no palpable mass or organomegaly Extremities: Warm, well-perfused, no clubbing cyanosis edema or tenderness,              no venous stasis changes of the legs Rectal/GU: Deferred Neuro: Grossly non--focal and symmetrical throughout Skin: Clean and dry without rash or ulceration   Diagnostic Tests: Coronary arteriograms show severe native coronary artery disease with patent mammary artery and patent vein grafts to LAD diagonal circumflex and posterior descending. Preserved LV systolic function  Impression Severe calcific aortic stenosis with mild aortic insufficiency Weight loss Dysphagia Depression from wife recent death  The patient has severe aortic stenosis and needs aortic valve replacement. Redo sternotomy with 4 patent vein grafts at his age with weight loss and poor overall health would be extremely high risk and his best option would be transcatheter aortic valve replacement. He is very interested in the procedure and wishes to proceed with evaluation and intervention if possible. :   Plan: He will  complete a formal evaluation for transcatheter aortic valve replacement therapy and meet with all members of the program.   Len Childs, MD [facility] [phone]

## 2017-07-11 ENCOUNTER — Ambulatory Visit (HOSPITAL_COMMUNITY): Admission: RE | Admit: 2017-07-11 | Payer: Medicare Other | Source: Ambulatory Visit

## 2017-07-11 ENCOUNTER — Ambulatory Visit (HOSPITAL_COMMUNITY)
Admission: RE | Admit: 2017-07-11 | Discharge: 2017-07-11 | Disposition: A | Discharge status: Home / Self Care | Payer: Medicare Other | Source: Ambulatory Visit | Attending: Cardiovascular Disease | Admitting: Cardiovascular Disease

## 2017-07-11 DIAGNOSIS — I2510 Atherosclerotic heart disease of native coronary artery without angina pectoris: Secondary | ICD-10-CM | POA: Diagnosis not present

## 2017-07-11 DIAGNOSIS — Z951 Presence of aortocoronary bypass graft: Secondary | ICD-10-CM | POA: Insufficient documentation

## 2017-07-11 DIAGNOSIS — K5730 Diverticulosis of large intestine without perforation or abscess without bleeding: Secondary | ICD-10-CM | POA: Diagnosis not present

## 2017-07-11 DIAGNOSIS — K8020 Calculus of gallbladder without cholecystitis without obstruction: Secondary | ICD-10-CM | POA: Insufficient documentation

## 2017-07-11 DIAGNOSIS — I714 Abdominal aortic aneurysm, without rupture: Secondary | ICD-10-CM | POA: Insufficient documentation

## 2017-07-11 DIAGNOSIS — I350 Nonrheumatic aortic (valve) stenosis: Secondary | ICD-10-CM | POA: Diagnosis present

## 2017-07-11 DIAGNOSIS — N200 Calculus of kidney: Secondary | ICD-10-CM | POA: Insufficient documentation

## 2017-07-11 DIAGNOSIS — I700 Atherosclerosis of aorta: Secondary | ICD-10-CM | POA: Diagnosis not present

## 2017-07-11 MED ORDER — METOPROLOL TARTRATE 5 MG/5ML IV SOLN
5.0000 mg | Freq: Once | INTRAVENOUS | Status: AC
  Administered 2017-07-11: 5 mg via INTRAVENOUS

## 2017-07-11 MED ORDER — IOPAMIDOL (ISOVUE-370) INJECTION 76%
INTRAVENOUS | Status: AC
  Filled 2017-07-11: qty 100

## 2017-07-11 MED ORDER — IOPAMIDOL (ISOVUE-370) INJECTION 76%
INTRAVENOUS | Status: AC
  Administered 2017-07-11: 95 mL
  Filled 2017-07-11: qty 50

## 2017-07-11 MED ORDER — METOPROLOL TARTRATE 5 MG/5ML IV SOLN
INTRAVENOUS | Status: AC
  Filled 2017-07-11: qty 5

## 2017-07-12 ENCOUNTER — Ambulatory Visit: Payer: Medicare Other | Attending: Cardiovascular Disease | Admitting: Physical Therapy

## 2017-07-12 ENCOUNTER — Other Ambulatory Visit: Payer: Self-pay | Admitting: *Deleted

## 2017-07-12 ENCOUNTER — Encounter: Payer: Self-pay | Admitting: Physical Therapy

## 2017-07-12 DIAGNOSIS — I350 Nonrheumatic aortic (valve) stenosis: Secondary | ICD-10-CM

## 2017-07-12 DIAGNOSIS — R2689 Other abnormalities of gait and mobility: Secondary | ICD-10-CM | POA: Diagnosis not present

## 2017-07-12 NOTE — Therapy (Signed)
[place], [place], [address] Phone: [phone]   Fax:  [phone]  Physical Therapy Evaluation  Patient Details  Name: Cory Bradley MRN: [id number] Date of Birth: 06-07-1933 Referring Provider: Dr. Lauree Chandler  Encounter Date: 07/12/2017      PT End of Session - 07/12/17 0901    Visit Number 1   PT Start Time 3491   PT Stop Time 0936   PT Time Calculation (min) 44 min      Past Medical History:  Diagnosis Date  . Allergic rhinitis   . Aortic stenosis    a. Mild-mod by echo 01/2014. // b. Echo 6/18: Mod LVH EF 55-60, normal wall motion, Gr 2 DD, mod to severe AS (mean 40, peak 68; AVA by mean velocity 0.73 cm), mild AI, mod MAC, mild MR, severe LAE, mildly reduced RVSF, mod RAE  . CKD (chronic kidney disease), stage II   . Coronary artery disease    a. acute inferior MI s/p PTCA of RCA followed by CABGx4 in 2005,  . Diabetes mellitus ([place])   . ED (erectile dysfunction)   . Hyperlipidemia   . Hypertension   . Irregular heartbeat 10/2016  . Left carotid stenosis   . Memory loss   . Osteoarthritis   . Reflux   . Tobacco abuse     Past Surgical History:  Procedure Laterality Date  . APPENDECTOMY    . CORONARY ARTERY BYPASS GRAFT    . ELBOW SURGERY    . RIGHT/LEFT HEART CATH AND CORONARY/GRAFT ANGIOGRAPHY N/A 05/28/2017   Procedure: Right/Left Heart Cath and Coronary/Graft Angiography;  Surgeon: Belva Crome, MD;  Location: [facility];  Service: Cardiovascular;  Laterality: N/A;    There were no vitals filed for this visit.       Subjective Assessment - 07/12/17 0857    Subjective Pt reports worsening shortness of breath with acitivty that was significantly affecting his daily activities and requiring frequnet rest after grooming, taking out trash, etc. Pt started new med about 4 weks ago and is feeling better but still notices shortness of breath with walking up  inclines.    Patient Stated Goals to fix heart and improve activity level   Currently in Pain? No/denies            [facility] PT Assessment - 07/12/17 0001      Assessment   Medical Diagnosis severe aortic stenosis   Referring Provider Dr. Lauree Chandler   Onset Date/Surgical Date --  about 2 months ago     Precautions   Precautions None     Restrictions   Weight Bearing Restrictions No     Balance Screen   Has the patient fallen in the past 6 months No   Has the patient had a decrease in activity level because of a fear of falling?  No   Is the patient reluctant to leave their home because of a fear of falling?  No     Home Environment   Living Environment Private residence   Home Access Stairs to enter   Entrance Stairs-Number of Steps 6   Entrance Stairs-Rails Right;Left;Can reach both   [place] One level     Prior Function   Level of Independence Independent     ROM / Strength   AROM / PROM / Strength AROM;Strength     AROM   Overall AROM Comments grossly WNL     Strength  Overall Strength Comments grossly 5/5 throughout   Strength Assessment Site Hand   Right/Left hand Right;Left   Right Hand Grip (lbs) 65  R hand dominant   Left Hand Grip (lbs) 45     Ambulation/Gait   Gait Comments No significant gait deviations noted. Pt's distance limited by 13% for age/gender.           [facility] Pre-Surgical Assessment - 2017-07-25 0001    5 Meter Walk Test- trial 1 (P)  5 sec   5 Meter Walk Test- trial 2 (P)  4 sec.    5 Meter Walk Test- trial 3 (P)  5 sec.   5 meter walk test average (P)  4.67 sec   4 Stage Balance Test tolerated for:  3 sec.   4 Stage Balance Test Position 4   Sit To Stand Test- trial 1 10 sec.   ADL/IADL Independent with: Bathing;Dressing;Yard work;Finances;Meal prep   6 Minute Walk- Baseline yes   BP (mmHg) 118/68   HR (bpm) (!)  32   02 Sat (%RA) 99 %   Modified Borg Scale for Dyspnea 0- Nothing at all   Perceived Rate of  Exertion (Borg) 6-   6 Minute Walk Post Test yes   BP (mmHg) (!)  120/103  118 68 taken manually 5 minutes after   HR (bpm) (!)  41   02 Sat (%RA) 99 %   Modified Borg Scale for Dyspnea 0- Nothing at all   Perceived Rate of Exertion (Borg) 9- very light   Aerobic Endurance Distance Walked 1185          Objective measurements completed on examination: See above findings.                               Plan - July 25, 2017 0955    Clinical Impression Statement see below   PT Frequency One time visit     Clinical Impression Statement: Pt is an 83 yo male presenting to OP PT for evaluation prior to possible TAVR surgery due to severe aortic stenosis. Pt reports onset of shortness of breath with minimal activity about 2 months ago. He started new medication about 1 month ago and feels better but is still limited with walking up inclines. Pt presents with good ROM and strength, good balance and is not at high fall risk 4 stage balance test, good walking speed and good aerobic endurance per 6 minute walk test. Pt ambulated a total of 185 feet in 6 minute walk. His radial pulse was 32 at rest, increased to 67 about 2:00 into the 6 minute walk and ended at 41 bpm. His BP after the 6 minute walk read 120/103 with auto cuff and taken 5 minutes later with a manual cuff, it read 118/68. Pt denied any shortness of breath or other symptoms during evaluation today although all ambulation is performed on level indoor surfaces. Based on the Short Physical Performance Battery, patient has a frailty rating of 12/12 with </= 5/12 considered frail. TAVR RN, Levonne Spiller, notified of low pulse rate. Evaluation continued with close monitoring.   Patient demonstrated the following deficits and impairments:     Visit Diagnosis: Other abnormalities of gait and mobility      G-Codes - 25-Jul-2017 0955    Functional Assessment Tool Used (Outpatient Only) 6 minute walk 1185'   Functional  Limitation Mobility: Walking and moving around   Mobility: Walking and Moving Around  Current Status [phone]) At least 1 percent but less than 20 percent impaired, limited or restricted   Mobility: Walking and Moving Around Goal Status [phone]) At least 1 percent but less than 20 percent impaired, limited or restricted   Mobility: Walking and Moving Around Discharge Status [phone]) At least 1 percent but less than 20 percent impaired, limited or restricted       Problem List Patient Active Problem List   Diagnosis Date Noted  . Dyspnea on exertion 05/26/2017  . CAD (coronary artery disease) 05/16/2017  . SVT (supraventricular tachycardia) ([place]) 05/16/2017  . Carotid artery disease ([place]) 05/16/2017  . Aortic stenosis   . CKD (chronic kidney disease), stage II   . Cigarette smoker   . Essential hypertension   . Hyperlipidemia     Mayo, PT 07/12/2017, 9:56 AM  [facility] [address] [place], [place], [address] Phone: [phone]   Fax:  [phone]  Name: Cory Bradley MRN: [id number] Date of Birth: 1933-04-21

## 2017-07-14 ENCOUNTER — Emergency Department (HOSPITAL_COMMUNITY)
Admission: EM | Admit: 2017-07-14 | Discharge: 2017-07-15 | Disposition: A | Discharge status: Home / Self Care | Payer: Medicare Other | Attending: Emergency Medicine | Admitting: Emergency Medicine

## 2017-07-14 ENCOUNTER — Encounter (HOSPITAL_COMMUNITY): Payer: Self-pay

## 2017-07-14 DIAGNOSIS — E1122 Type 2 diabetes mellitus with diabetic chronic kidney disease: Secondary | ICD-10-CM | POA: Insufficient documentation

## 2017-07-14 DIAGNOSIS — I2510 Atherosclerotic heart disease of native coronary artery without angina pectoris: Secondary | ICD-10-CM | POA: Insufficient documentation

## 2017-07-14 DIAGNOSIS — F17210 Nicotine dependence, cigarettes, uncomplicated: Secondary | ICD-10-CM | POA: Diagnosis not present

## 2017-07-14 DIAGNOSIS — N182 Chronic kidney disease, stage 2 (mild): Secondary | ICD-10-CM | POA: Diagnosis not present

## 2017-07-14 DIAGNOSIS — I471 Supraventricular tachycardia: Secondary | ICD-10-CM | POA: Insufficient documentation

## 2017-07-14 DIAGNOSIS — Z79899 Other long term (current) drug therapy: Secondary | ICD-10-CM | POA: Diagnosis not present

## 2017-07-14 DIAGNOSIS — Z7982 Long term (current) use of aspirin: Secondary | ICD-10-CM | POA: Diagnosis not present

## 2017-07-14 DIAGNOSIS — I129 Hypertensive chronic kidney disease with stage 1 through stage 4 chronic kidney disease, or unspecified chronic kidney disease: Secondary | ICD-10-CM | POA: Diagnosis not present

## 2017-07-14 DIAGNOSIS — R42 Dizziness and giddiness: Secondary | ICD-10-CM | POA: Diagnosis present

## 2017-07-14 NOTE — ED Triage Notes (Signed)
Pt arrived via [facility] from home c/o SVT at rest.  EMS gave 18mg  Adenosine denies and chest pain, SOB.

## 2017-07-14 NOTE — ED Provider Notes (Signed)
[facility] DEPT Provider Note   CSN: [id number] Arrival date & time: 07/14/17  2330  By signing my name below, I, Ny'Kea Lewis, attest that this documentation has been prepared under the direction and in the presence of Ripley Fraise, MD. Electronically Signed: Lise Auer, ED Scribe. 07/15/17. 12:02 AM.  History   Chief Complaint No chief complaint on file.   The history is provided by the patient. No language interpreter was used.    HPI HPI Comments: Cory Bradley is a 83 y.o. male brought in by ambulance, who presents to the Emergency Department complaining of sudden onset, persistent elevated heart rate that began at 9:30 pm tonight. He notes associated lightheadedness. Pt reports he was sitting down watching tv when he began to feel his "heart racing" and "a strong pulsation". He states he checked his BP and it was normal, and his HR was 150. He endorses having similar episodes at least three times this year, but due to him having his aortic valve replaced on 8/14, he wanted to be evaluated to be sure his was fine. Pt reports having a normal day prior to this incident. Denies any changes in medication or starting new medications. Denies fever, shortness of breath, chest pain, abdominal pain, nausea, headache or syncope.  Past Medical History:  Diagnosis Date  . Allergic rhinitis   . Aortic stenosis    a. Mild-mod by echo 01/2014. // b. Echo 6/18: Mod LVH EF 55-60, normal wall motion, Gr 2 DD, mod to severe AS (mean 40, peak 68; AVA by mean velocity 0.73 cm), mild AI, mod MAC, mild MR, severe LAE, mildly reduced RVSF, mod RAE  . CKD (chronic kidney disease), stage II   . Coronary artery disease    a. acute inferior MI s/p PTCA of RCA followed by CABGx4 in 2005,  . Diabetes mellitus ([place])   . ED (erectile dysfunction)   . Hyperlipidemia   . Hypertension   . Irregular heartbeat 10/2016  . Left carotid stenosis   . Memory loss   . Osteoarthritis   . Reflux   . Tobacco  abuse     Patient Active Problem List   Diagnosis Date Noted  . Dyspnea on exertion 05/26/2017  . CAD (coronary artery disease) 05/16/2017  . SVT (supraventricular tachycardia) ([place]) 05/16/2017  . Carotid artery disease ([place]) 05/16/2017  . Aortic stenosis   . CKD (chronic kidney disease), stage II   . Cigarette smoker   . Essential hypertension   . Hyperlipidemia     Past Surgical History:  Procedure Laterality Date  . APPENDECTOMY    . CORONARY ARTERY BYPASS GRAFT    . ELBOW SURGERY    . RIGHT/LEFT HEART CATH AND CORONARY/GRAFT ANGIOGRAPHY N/A 05/28/2017   Procedure: Right/Left Heart Cath and Coronary/Graft Angiography;  Surgeon: Belva Crome, MD;  Location: [facility];  Service: Cardiovascular;  Laterality: N/A;    Home Medications    Prior to Admission medications   Medication Sig Start Date End Date Taking? Authorizing Provider  aspirin EC 81 MG tablet Take 1 tablet (81 mg total) by mouth daily. 05/16/17   Richardson Dopp T, PA-C  atorvastatin (LIPITOR) 80 MG tablet Take 80 mg by mouth daily.    [provider]  diphenhydrAMINE (BENADRYL) 25 MG tablet Take 25 mg by mouth every 6 (six) hours as needed.    [provider]  donepezil (ARICEPT) 5 MG tablet Take 5 mg by mouth at bedtime.    [provider]  furosemide (LASIX) 20 MG tablet Take 1 tablet (20 mg total) by mouth daily. 06/18/17 09/16/17  Belva Crome, MD  irbesartan (AVAPRO) 150 MG tablet Take 1 tablet (150 mg total) by mouth daily. 06/14/17   Tanda Rockers, MD  memantine (NAMENDA) 10 MG tablet Take 10 mg by mouth 2 (two) times daily.    [provider]  metFORMIN (GLUCOPHAGE) 500 MG tablet TAKE 1 TABLET (500 MG TOTAL) BY MOUTH DAILY. 08/10/15   [provider]  naproxen sodium (ANAPROX) 550 MG tablet Take 550 mg by mouth 2 (two) times daily as needed for mild pain.  04/29/15   [provider]  niacin (NIASPAN) 1000 MG CR tablet Take 1,000 mg by mouth at  bedtime.    [provider]  Omega-3 Fatty Acids (FISH OIL) 1000 MG CAPS Take 1,000 mg by mouth 2 (two) times daily.    [provider]  omeprazole (PRILOSEC) 40 MG capsule Take 40 mg by mouth daily.    [provider]  potassium chloride (K-DUR) 10 MEQ tablet Take 1 tablet (10 mEq total) by mouth daily. 06/18/17 09/16/17  Belva Crome, MD  propranolol ER (INDERAL LA) 120 MG 24 hr capsule Take 120 mg by mouth daily.    [provider]  Specialty Vitamins Products (PROSTATE) TABS Take 1 tablet by mouth daily.    [provider]  temazepam (RESTORIL) 15 MG capsule Take 15 mg by mouth at bedtime.  09/11/16   [provider]    Family History Family History  Problem Relation Age of Onset  . Heart disease Father     Social History Social History  Substance Use Topics  . Smoking status: Current Every Day Smoker    Packs/day: 0.50    Years: 40.00    Types: Cigarettes  . Smokeless tobacco: Never Used  . Alcohol use No    Allergies   Penicillins and Sulfa antibiotics  Review of Systems Review of Systems  Constitutional: Negative for fever.  Respiratory: Negative for shortness of breath.   Cardiovascular: Negative for chest pain.  Gastrointestinal: Negative for abdominal pain and vomiting.  Neurological: Positive for light-headedness. Negative for syncope and headaches.  All other systems reviewed and are negative.  Physical Exam Updated Vital Signs BP 100/68   Pulse 68   Temp 98.3 F (36.8 C) (Oral)   Resp (!) 21   SpO2 94%   Physical Exam CONSTITUTIONAL: Elderly, well appearing, no acute distress HEAD: Normocephalic/atraumatic EYES: EOMI/PERRL ENMT: Mucous membranes moist NECK: supple no meningeal signs SPINE/BACK:entire spine nontender CV: V5/I4 noted, systolic ejection murmur noted LUNGS: Lungs are clear to auscultation bilaterally, no apparent distress ABDOMEN: soft, nontender, no rebound or guarding, bowel sounds  noted throughout abdomen GU:no cva tenderness NEURO: Pt is awake/alert/appropriate, moves all extremitiesx4.  No facial droop.  No arm or leg drift noted. EXTREMITIES: pulses normal/equal, full ROM SKIN: warm, color normal PSYCH: no abnormalities of mood noted, alert and oriented to situation  ED Treatments / Results  DIAGNOSTIC STUDIES: Oxygen Saturation is 98% on RA, normal by my interpretation.   COORDINATION OF CARE: 11:58 PM-Discussed next steps with pt. Pt verbalized understanding and is agreeable with the plan.   Labs (all labs ordered are listed, but only abnormal results are displayed) Labs Reviewed  BASIC METABOLIC PANEL - Abnormal; Notable for the following:       Result Value   BUN 21 (*)    Calcium 8.2 (*)  GFR calc non Af Amer 60 (*)    All other components within normal limits  CBC WITH DIFFERENTIAL/PLATELET - Abnormal; Notable for the following:    RBC 3.83 (*)    Hemoglobin 11.3 (*)    HCT 35.0 (*)    Platelets 130 (*)    All other components within normal limits  I-STAT TROPONIN, ED    EKG  EKG Interpretation  Date/Time:  Saturday July 14 2017 23:38:12 EDT Ventricular Rate:  79 PR Interval:    QRS Duration: 122 QT Interval:  470 QTC Calculation: 425 R Axis:   10 Text Interpretation:  Sinus rhythm Ventricular bigeminy IVCD, consider atypical RBBB Confirmed by Ripley Fraise [phone]) on 07/15/2017 12:02:22 AM      Radiology No results found.  Procedures Procedures (including critical care time)  Medications Ordered in ED Medications - No data to display  Initial Impression / Assessment and Plan / ED Course  I have reviewed the triage vital signs and the nursing notes.  Pertinent labs  results that were available during my care of the patient were reviewed by me and considered in my medical decision making (see chart for details).    2:40 AM Pt here for SVT, now resolved I reviewed EMS EKG strips He was given adenosine en route that  stopped SVT No other symptoms Denies CP or any new SOB He is scheduled for TAVR in August due to significant aortic stenosis  Overall well appearing He is taking all of his home meds I feel he is safe for d/c home after monitoring here in the ED for up to 3 hrs Pt agreeable I don't suspect ACS/PE at this time  Final Clinical Impressions(s) / ED Diagnoses   Final diagnoses:  SVT (supraventricular tachycardia) ([place])    New Prescriptions New Prescriptions   No medications on file  I personally performed the services described in this documentation, which was scribed in my presence. The recorded information has been reviewed and is accurate.        Ripley Fraise, MD 07/15/17 [phone]

## 2017-07-15 LAB — CBC WITH DIFFERENTIAL/PLATELET
Basophils Absolute: 0 10*3/uL (ref 0.0–0.1)
Basophils Relative: 0 %
EOS ABS: 0.2 10*3/uL (ref 0.0–0.7)
Eosinophils Relative: 2 %
HEMATOCRIT: 35 % — AB (ref 39.0–52.0)
HEMOGLOBIN: 11.3 g/dL — AB (ref 13.0–17.0)
LYMPHS ABS: 2 10*3/uL (ref 0.7–4.0)
Lymphocytes Relative: 29 %
MCH: 29.5 pg (ref 26.0–34.0)
MCHC: 32.3 g/dL (ref 30.0–36.0)
MCV: 91.4 fL (ref 78.0–100.0)
MONO ABS: 0.5 10*3/uL (ref 0.1–1.0)
MONOS PCT: 7 %
NEUTROS ABS: 4.2 10*3/uL (ref 1.7–7.7)
NEUTROS PCT: 62 %
Platelets: 130 10*3/uL — ABNORMAL LOW (ref 150–400)
RBC: 3.83 MIL/uL — ABNORMAL LOW (ref 4.22–5.81)
RDW: 14.3 % (ref 11.5–15.5)
WBC: 6.9 10*3/uL (ref 4.0–10.5)

## 2017-07-15 LAB — BASIC METABOLIC PANEL
Anion gap: 5 (ref 5–15)
BUN: 21 mg/dL — ABNORMAL HIGH (ref 6–20)
CHLORIDE: 111 mmol/L (ref 101–111)
CO2: 26 mmol/L (ref 22–32)
CREATININE: 1.12 mg/dL (ref 0.61–1.24)
Calcium: 8.2 mg/dL — ABNORMAL LOW (ref 8.9–10.3)
GFR calc non Af Amer: 60 mL/min — ABNORMAL LOW (ref >60)
GLUCOSE: 97 mg/dL (ref 65–99)
Potassium: 3.9 mmol/L (ref 3.5–5.1)
Sodium: 142 mmol/L (ref 135–145)

## 2017-07-15 LAB — I-STAT TROPONIN, ED: Troponin i, poc: 0.04 ng/mL (ref 0.00–0.08)

## 2017-07-15 NOTE — Discharge Instructions (Signed)
GOOD LUCK WITH YOUR HEART PROCEDURE

## 2017-07-15 NOTE — ED Notes (Signed)
Pt stable, ambulatory, states understanding of discharge instructions 

## 2017-07-16 ENCOUNTER — Institutional Professional Consult (permissible substitution): Payer: Medicare Other | Admitting: Cardiology

## 2017-07-25 ENCOUNTER — Institutional Professional Consult (permissible substitution) (INDEPENDENT_AMBULATORY_CARE_PROVIDER_SITE_OTHER): Payer: Medicare Other | Admitting: Surgery

## 2017-07-25 VITALS — BP 128/58 | HR 48 | Resp 16

## 2017-07-25 DIAGNOSIS — I350 Nonrheumatic aortic (valve) stenosis: Secondary | ICD-10-CM | POA: Diagnosis not present

## 2017-07-26 ENCOUNTER — Ambulatory Visit (HOSPITAL_COMMUNITY)
Admission: RE | Admit: 2017-07-26 | Discharge: 2017-07-26 | Disposition: A | Discharge status: Home / Self Care | Payer: Medicare Other | Source: Ambulatory Visit | Attending: Cardiothoracic Surgery | Admitting: Cardiothoracic Surgery

## 2017-07-26 ENCOUNTER — Encounter: Payer: Medicare Other | Admitting: Surgery

## 2017-07-26 ENCOUNTER — Encounter (HOSPITAL_COMMUNITY)
Admission: RE | Admit: 2017-07-26 | Discharge: 2017-07-26 | Disposition: A | Discharge status: Home / Self Care | Payer: Medicare Other | Source: Ambulatory Visit | Attending: Cardiovascular Disease | Admitting: Cardiovascular Disease

## 2017-07-26 ENCOUNTER — Encounter (HOSPITAL_COMMUNITY): Payer: Self-pay

## 2017-07-26 DIAGNOSIS — I517 Cardiomegaly: Secondary | ICD-10-CM | POA: Diagnosis not present

## 2017-07-26 DIAGNOSIS — I350 Nonrheumatic aortic (valve) stenosis: Secondary | ICD-10-CM | POA: Diagnosis present

## 2017-07-26 DIAGNOSIS — Z951 Presence of aortocoronary bypass graft: Secondary | ICD-10-CM | POA: Insufficient documentation

## 2017-07-26 HISTORY — DX: Abdominal aortic aneurysm, without rupture, unspecified: I71.40

## 2017-07-26 HISTORY — DX: Abdominal aortic aneurysm, without rupture: I71.4

## 2017-07-26 LAB — COMPREHENSIVE METABOLIC PANEL
ALT: 15 U/L — ABNORMAL LOW (ref 17–63)
AST: 20 U/L (ref 15–41)
Albumin: 3.7 g/dL (ref 3.5–5.0)
Alkaline Phosphatase: 43 U/L (ref 38–126)
Anion gap: 9 (ref 5–15)
BUN: 31 mg/dL — ABNORMAL HIGH (ref 6–20)
CO2: 22 mmol/L (ref 22–32)
Calcium: 8.7 mg/dL — ABNORMAL LOW (ref 8.9–10.3)
Chloride: 107 mmol/L (ref 101–111)
Creatinine, Ser: 1.31 mg/dL — ABNORMAL HIGH (ref 0.61–1.24)
GFR calc Af Amer: 57 mL/min — ABNORMAL LOW (ref >60)
GFR calc non Af Amer: 49 mL/min — ABNORMAL LOW (ref >60)
Glucose, Bld: 126 mg/dL — ABNORMAL HIGH (ref 65–99)
Potassium: 4.4 mmol/L (ref 3.5–5.1)
Sodium: 138 mmol/L (ref 135–145)
Total Bilirubin: 0.9 mg/dL (ref 0.3–1.2)
Total Protein: 6.2 g/dL — ABNORMAL LOW (ref 6.5–8.1)

## 2017-07-26 LAB — URINALYSIS, ROUTINE W REFLEX MICROSCOPIC
Bacteria, UA: NONE SEEN
Bilirubin Urine: NEGATIVE
Glucose, UA: NEGATIVE mg/dL
Hgb urine dipstick: NEGATIVE
Ketones, ur: NEGATIVE mg/dL
Leukocytes, UA: NEGATIVE
Nitrite: NEGATIVE
Protein, ur: 30 mg/dL — AB
Specific Gravity, Urine: 1.019 (ref 1.005–1.030)
pH: 6 (ref 5.0–8.0)

## 2017-07-26 LAB — SURGICAL PCR SCREEN
MRSA, PCR: NEGATIVE
Staphylococcus aureus: POSITIVE — AB

## 2017-07-26 LAB — BLOOD GAS, ARTERIAL
Acid-Base Excess: 1.4 mmol/L (ref 0.0–2.0)
Bicarbonate: 25 mmol/L (ref 20.0–28.0)
Drawn by: 470591
FIO2: 21
O2 Saturation: 97.7 %
Patient temperature: 98.6
pCO2 arterial: 36 mmHg (ref 32.0–48.0)
pH, Arterial: 7.455 — ABNORMAL HIGH (ref 7.350–7.450)
pO2, Arterial: 99.6 mmHg (ref 83.0–108.0)

## 2017-07-26 LAB — HEMOGLOBIN A1C
Hgb A1c MFr Bld: 5.5 % (ref 4.8–5.6)
Mean Plasma Glucose: 111.15 mg/dL

## 2017-07-26 LAB — CBC
HCT: 38.9 % — ABNORMAL LOW (ref 39.0–52.0)
Hemoglobin: 13 g/dL (ref 13.0–17.0)
MCH: 29.9 pg (ref 26.0–34.0)
MCHC: 33.4 g/dL (ref 30.0–36.0)
MCV: 89.4 fL (ref 78.0–100.0)
Platelets: 140 10*3/uL — ABNORMAL LOW (ref 150–400)
RBC: 4.35 MIL/uL (ref 4.22–5.81)
RDW: 13.8 % (ref 11.5–15.5)
WBC: 8.2 10*3/uL (ref 4.0–10.5)

## 2017-07-26 LAB — APTT: aPTT: 29 seconds (ref 24–36)

## 2017-07-26 LAB — TYPE AND SCREEN
ABO/RH(D): A POS
Antibody Screen: NEGATIVE

## 2017-07-26 LAB — ABO/RH: ABO/RH(D): A POS

## 2017-07-26 LAB — GLUCOSE, CAPILLARY: Glucose-Capillary: 109 mg/dL — ABNORMAL HIGH (ref 65–99)

## 2017-07-26 LAB — PROTIME-INR
INR: 1.04
Prothrombin Time: 13.6 seconds (ref 11.4–15.2)

## 2017-07-26 MED ORDER — CHLORHEXIDINE GLUCONATE 4 % EX LIQD: 30.0000 mL | CUTANEOUS | Status: DC

## 2017-07-26 NOTE — Progress Notes (Addendum)
PCP: Dr. Tracey Harriesavid Bouska   Cardiologist: Dr. Verdis PrimeHenry Smith  EKG: 07/15/17  Stress test: pt unsure, has been years ago if so  ECHO:06/2017 in [facility]  Cardiac Cath: 6/112018  Chest x-ray:pending, done today

## 2017-07-26 NOTE — Pre-Procedure Instructions (Signed)
Cory Bradley  07/26/2017      [facility] [place], [place] - [address] [place] [address] Phone: [phone] Fax: [phone]    Your procedure is scheduled on July 31, 2017.  [facility] to [facility] Admitting at 820 AM.  Call this number if you have problems the morning of surgery:  [phone]   Remember:  Do not eat food or drink liquids after midnight.  Take these medicines the morning of surgery with A SIP OF WATER propanolol ER (Inderal LA), diphenhydramine (benadryl), donepezil (aricept), memantine (namenda), omeprazole (prilosec)  7 days prior to surgery STOP taking any Aspirin (unless otherwise instructed by your surgeon), Aleve, Naproxen, Ibuprofen, Motrin, Advil, Goody's, BC's, all herbal medications, fish oil, and all vitamins     How to Manage Your Diabetes Before and After Surgery  Why is it important to control my blood sugar before and after surgery? . Improving blood sugar levels before and after surgery helps healing and can limit problems. . A way of improving blood sugar control is eating a healthy diet by: o  Eating less sugar and carbohydrates o  Increasing activity/exercise o  Talking with your doctor about reaching your blood sugar goals . High blood sugars (greater than 180 mg/dL) can raise your risk of infections and slow your recovery, so you will need to focus on controlling your diabetes during the weeks before surgery. . Make sure that the doctor who takes care of your diabetes knows about your planned surgery including the date and location.  How do I manage my blood sugar before surgery? . Check your blood sugar at least 4 times a day, starting 2 days before surgery, to make sure that the level is not too high or low. o Check your blood sugar the morning of your surgery when you wake up and every 2 hours until you get to the Short Stay unit. . If your blood sugar is less than 70 mg/dL,  you will need to treat for low blood sugar: o Do not take insulin. o Treat a low blood sugar (less than 70 mg/dL) with  cup of clear juice (cranberry or apple), 4 glucose tablets, OR glucose gel. o Recheck blood sugar in 15 minutes after treatment (to make sure it is greater than 70 mg/dL). If your blood sugar is not greater than 70 mg/dL on recheck, call [phone] for further instructions. . Report your blood sugar to the short stay nurse when you get to Short Stay.  . If you are admitted to the hospital after surgery: o Your blood sugar will be checked by the staff and you will probably be given insulin after surgery (instead of oral diabetes medicines) to make sure you have good blood sugar levels. o The goal for blood sugar control after surgery is 80-180 mg/dL.       WHAT DO I DO ABOUT MY DIABETES MEDICATION?   [name] Do not take oral diabetes medicines (pills) the morning of surgery.  . The day of surgery, do not take other diabetes injectables, including Byetta (exenatide), Bydureon (exenatide ER), Victoza (liraglutide), or Trulicity (dulaglutide).   Reviewed and Endorsed by [facility] Patient Education Committee, August 2015   Do not wear jewelry, make-up or nail polish.  Do not wear lotions, powders, or perfumes, or deoderant.             Men may shave face and neck.  Do not bring  valuables to the hospital.  [facility] is [facility] for any belongings or valuables.  Contacts, dentures or bridgework may not be worn into surgery.  Leave your suitcase in the car.  After surgery it may be brought to your room.  For patients admitted to the hospital, discharge time will be determined by your treatment team.  Patients discharged the day of surgery will not be allowed to drive home.    Special instructions:  [place]- Preparing For Surgery  Before surgery, you can play an important role. Because skin is not sterile, your skin needs to be as free of germs as  possible. You can reduce the number of germs on your skin by washing with CHG (chlorahexidine gluconate) Soap before surgery.  CHG is an antiseptic cleaner which kills germs and bonds with the skin to continue killing germs even after washing.  Please do not use if you have an allergy to CHG or antibacterial soaps. If your skin becomes reddened/irritated stop using the CHG.  Do not shave (including legs and underarms) for at least 48 hours prior to first CHG shower. It is OK to shave your face.  Please follow these instructions carefully.   1. Shower the NIGHT BEFORE SURGERY and the MORNING OF SURGERY with CHG.   2. If you chose to wash your hair, wash your hair first as usual with your normal shampoo.  3. After you shampoo, rinse your hair and body thoroughly to remove the shampoo.  4. Use CHG as you would any other liquid soap. You can apply CHG directly to the skin and wash gently with a scrungie or a clean washcloth.   5. Apply the CHG Soap to your body ONLY FROM THE NECK DOWN.  Do not use on open wounds or open sores. Avoid contact with your eyes, ears, mouth and genitals (private parts). Wash genitals (private parts) with your normal soap.  6. Wash thoroughly, paying special attention to the area where your surgery will be performed.  7. Thoroughly rinse your body with warm water from the neck down.  8. DO NOT shower/wash with your normal soap after using and rinsing off the CHG Soap.  9. Pat yourself dry with a CLEAN TOWEL.   10. Wear CLEAN PAJAMAS   11. Place CLEAN SHEETS on your bed the night of your first shower and DO NOT SLEEP WITH PETS.    Day of Surgery: Do not apply any deodorants/lotions. Please wear clean clothes to the hospital/surgery center.     Please read over the following fact sheets that you were given. Pain Booklet, Coughing and Deep Breathing, MRSA Information and Surgical Site Infection Prevention

## 2017-07-27 ENCOUNTER — Encounter (HOSPITAL_COMMUNITY): Payer: Self-pay

## 2017-07-27 NOTE — Progress Notes (Addendum)
Anesthesia Chart Review: Patient is a 83 year old male scheduled for TAVR, transfemoral approach on 07/31/17 by Dr. Laneta Simmers.  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 He is a [employer].  - PCP is Dr. Tracey Harries with [facility]). - Cardiologist is Dr. Verdis Prime. I don't see that patient has been evaluated by EP cardiology as of yet. Perhaps this will be done post-operative if with persistent/recurrent arrythmias/ectopy. (Message sent to [facility] RN Alycia Rossetti if anything different is planned. Update: Per [facility], EP evaluation to be done post-operative if still indicated.)  - Pulmonologist is Dr. Sandrea Hughs.  Meds include aspirin 81 mg, Lipitor, Benadryl, Aricept, Lasix, Avapro, Namenda, metformin, niacin, fish oil, Prilosec, KCl, Inderal LA, Restoril.  BP 128/80   Pulse 62   Resp 18   Ht 6\' 2"  (1.88 m)   Wt 166 lb 8 oz (75.5 kg)   SpO2 97%   BMI 21.38 kg/m   EKG 07/14/17: SR, ventricular bigeminy, IVCD, consider atypical RBBB.  Cardiac cath 05/28/17: Conclusion:  Patent bypass grafts including SVG to PDA, SVG to diagonal, and SVG to obtuse marginal.  Patent LIMA to LAD.  Severe native vessel disease with total occlusion of the mid RCA, occlusion of the ostial to proximal circumflex, occlusion of the first diagonal/ramus intermedius, and diffuse mid LAD disease up to 80%. Second diagonal contains diffuse 80-90% obstruction in the branch. Distal left main is 50% obstructed.  Ventricular ectopy (bigeminy) made hemodynamic assessment difficult. Pulmonary pressures are post PVC recordings and higher than would be  expected in normal sinus rhythm due to post PVC contractility augmentation.[name] Post PVC systolic pressure gradient is 67 mmHg. Pulmonary wedge pressure mean is 26 mmHg.  Aortic valve stenosis with valvular gradient also influenced by ventricular bigeminy. Peak to peak gradient of approximately 35 mmHg (using post PVC pressure wave forms), and calculated aortic valve area 1.27 cm  Previous documentation of normal left ventricular systolic function by recent echocardiogram with EF greater than 55%. RECOMMENDATIONS:  Pulmonary evaluation to rule out COPD as an explanation for the patient's dyspnea.  Consider EP consultation to determine if ventricular ectopy could have a role in symptoms. We need a 24-48 hour monitor to quantitate the prevalence of PVCs.  Consider referral for consideration of TAVR if no explanation for dyspnea can be identified. Mean pulmonary wedge pressure of 26 mmHg with an end-diastolic left ventricular pressure of 15 mmHg raises a question of mitral valve involvement which was not identified on echocardiography. Therefore likely a sampling error related to ventricular ectopy.  Echo 05/25/17: Study Conclusions - Left ventricle: The cavity size was normal. Wall thickness was   increased in a pattern of moderate LVH. Systolic function was   normal. The estimated ejection fraction was in the range of 55%   to 60%. Wall motion was normal; there were no regional wall   motion abnormalities. Features are consistent with a pseudonormal   left ventricular filling pattern, with concomitant abnormal   relaxation and increased filling pressure (grade 2 diastolic   dysfunction). - Aortic valve: Moderately calcified annulus. Moderately thickened,   severely calcified leaflets. Cusp separation was severely   reduced. There was moderate to  severe stenosis. There was mild   regurgitation. Valve area (VTI): 0.86 cm^2. Valve area (Vmax):   0.87 cm^2. Valve area (Vmean): 0.73 cm^2. -  Mitral valve: Mildly to moderately calcified annulus. There was   mild regurgitation. - Left atrium: The atrium was severely dilated. - Right ventricle: Systolic function was mildly reduced. - Right atrium: The atrium was moderately dilated.  24 hour Holter monitor 06/27/17:  Basic rhythm is NSR  Frequent PVC's, multiform with occasional NSVT.  PVC's account for > 42% of activity during the period of monitoring.  Basic rhythm is NSR Frequent PVC's account for > 40% of heart activity. Refer to EP for opinion.  Carotid U/S 05/16/17: Impression: Right ICA velocities suggest a 1-39% stenosis. Left ICA velocities suggest 40-59% stenosis.  CTA chest/abd/pelvis 07/11/17: IMPRESSION: 1. Vascular findings and measurements relevant to potential TAVR procedure, as detailed above. Based on size, the patient has suitable pelvic arterial access bilaterally. 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 2. Severe thickening and calcification of the aortic valve, compatible with the reported clinical history of severe aortic stenosis. 3. Aortic atherosclerosis, in addition to left main and 3 vessel coronary artery disease. Status post median sternotomy for CABG including LIMA to the LAD. 4. Mild fusiform aneurysmal dilatation of the infrarenal abdominal aorta which measures up to 3.3 x 3.2 cm. Recommend followup by ultrasound in 3 years. This recommendation follows ACR consensus guidelines: White Paper of the ACR Incidental Findings Committee II on Vascular Findings. J Am Coll Radiol 2013; 10:789-794. 5. Large calculus in the left side of the urinary bladder at or adjacent to the left ureterovesicular junction. There is no associated hydroureteronephrosis to indicate urinary tract obstruction at this time. There also nonobstructive calculi in the lower  pole collecting systems of the kidneys bilaterally measuring up to 12 mm in the lower pole the right kidney where there is extensive overlying scarring. 6. Cholelithiasis without evidence of acute cholecystitis at this time. 7. Colonic diverticulosis without evidence to suggest an acute diverticulitis at this time.  PFTs 06/06/17: FVC 3.92 (84%), FEV1 2.76 (83%), DLCOunc 13.33 (35%). Corrects to 48% for alv volume.  - 06/14/2017  Walked RA x 3 laps @ 185 ft each stopped due to  End of study , tired > sob, sats 100%  - trial off acei 06/14/2017   Preoperative cardiac CT, CXR, and labs noted.   If no acute changes then I would anticipate that he can proceed as planned.   Velna Ochsllison Jayonna Meyering, PA-C [facility] Phone [phone] 07/27/2017 2:59 PM

## 2017-07-30 ENCOUNTER — Encounter: Payer: Self-pay | Admitting: Surgery

## 2017-07-30 MED ORDER — CHLORHEXIDINE GLUCONATE 0.12 % MT SOLN
15.0000 mL | Freq: Once | OROMUCOSAL | Status: AC
  Administered 2017-07-31: 15 mL via OROMUCOSAL
  Filled 2017-07-30: qty 15

## 2017-07-30 MED ORDER — SODIUM CHLORIDE 0.9 % IV SOLN
INTRAVENOUS | Status: DC
  Filled 2017-07-30: qty 30

## 2017-07-30 MED ORDER — DOPAMINE-DEXTROSE 3.2-5 MG/ML-% IV SOLN
0.0000 ug/kg/min | INTRAVENOUS | Status: DC
  Filled 2017-07-30: qty 250

## 2017-07-30 MED ORDER — DEXMEDETOMIDINE HCL IN NACL 400 MCG/100ML IV SOLN
0.1000 ug/kg/h | INTRAVENOUS | Status: DC
  Filled 2017-07-30: qty 100

## 2017-07-30 MED ORDER — NOREPINEPHRINE BITARTRATE 1 MG/ML IV SOLN
0.0000 ug/min | INTRAVENOUS | Status: DC
  Filled 2017-07-30: qty 4

## 2017-07-30 MED ORDER — SODIUM CHLORIDE 0.9 % IV SOLN
INTRAVENOUS | Status: DC
  Administered 2017-07-31: 13:00:00 via INTRAVENOUS

## 2017-07-30 MED ORDER — MAGNESIUM SULFATE 50 % IJ SOLN
40.0000 meq | INTRAMUSCULAR | Status: DC
  Filled 2017-07-30: qty 10

## 2017-07-30 MED ORDER — LEVOFLOXACIN IN D5W 500 MG/100ML IV SOLN
500.0000 mg | INTRAVENOUS | Status: AC
  Administered 2017-07-31: 500 mg via INTRAVENOUS
  Filled 2017-07-30: qty 100

## 2017-07-30 MED ORDER — POTASSIUM CHLORIDE 2 MEQ/ML IV SOLN
80.0000 meq | INTRAVENOUS | Status: DC
  Filled 2017-07-30: qty 40

## 2017-07-30 MED ORDER — VANCOMYCIN HCL 10 G IV SOLR
1250.0000 mg | INTRAVENOUS | Status: AC
  Administered 2017-07-31: 1250 mg via INTRAVENOUS
  Filled 2017-07-30: qty 1250

## 2017-07-30 MED ORDER — SODIUM CHLORIDE 0.9 % IV SOLN
30.0000 ug/min | INTRAVENOUS | Status: DC
  Filled 2017-07-30: qty 2

## 2017-07-30 MED ORDER — EPINEPHRINE PF 1 MG/ML IJ SOLN
0.0000 ug/min | INTRAVENOUS | Status: DC
  Filled 2017-07-30: qty 4

## 2017-07-30 MED ORDER — SODIUM CHLORIDE 0.9 % IV SOLN
INTRAVENOUS | Status: DC
  Filled 2017-07-30: qty 1

## 2017-07-30 MED ORDER — NITROGLYCERIN IN D5W 200-5 MCG/ML-% IV SOLN
2.0000 ug/min | INTRAVENOUS | Status: DC
  Filled 2017-07-30: qty 250

## 2017-07-30 NOTE — Progress Notes (Signed)
[facility]  MULTIDISCIPLINARY HEART VALVE CLINIC  CARDIOTHORACIC SURGERY CONSULTATION REPORT  Referring Provider is Belva Crome, MD PCP is Bernerd Limbo, MD  Reason for consultation: severe aortic stenosis   HPI:  The patient is an 83 year old gentleman with a history of DM, hypertension, hyperlipidemia, chronic kidney disease, aortic stenosis and CAD s/p acute inferior STEMI with PCI of the RCA followed by CABG x 4 in 2005. He has a several month history of progressive shortness of breath with exertion and an echo on 05/25/2017 showed severe AS with a mean gradient of 40 mm Hg with an EF of 55-60%. His previous echo in 09/2016 showed a mean gradient of 32 mm Hg. He underwent cath on 05/28/2017 which showed patent grafts to the PDA, diagonal, and OM with a patent LIMA to the LAD. There was severe native vessel CAD. The peak to peak gradient was 35 mm Hg and calculated AVA 1.27 cm2. He was referred for pulmonary evaluation which was done by Dr. Melvyn Novas and PFT's were normal.  He has had SOB with exertion for at least 6 months. His wife died in 03/07/2017 of pancreatic cancer and he really did not think about the symptoms prior to that because he was so busy with her. He has been fatigued and has no energy. He denies orthopnea, dizziness, peripheral edema.   Past Medical History:  Diagnosis Date  . AAA (abdominal aortic aneurysm) (HCC)    3.3 cm infrarenal AAA 06/2017 CTA; 3 year f/u rec.  . Allergic rhinitis   . Aortic stenosis    a. Mild-mod by echo 01/2014. // b. Echo 6/18: Mod LVH EF 55-60, normal wall motion, Gr 2 DD, mod to severe AS (mean 40, peak 68; AVA by mean velocity 0.73 cm), mild AI, mod MAC, mild MR, severe LAE, mildly reduced RVSF, mod RAE  . CKD (chronic kidney disease), stage II   . Coronary artery disease    a. acute inferior MI s/p PTCA of RCA followed by CABGx4 in 2005,  . Diabetes mellitus ([place])   . ED (erectile dysfunction)   . Hyperlipidemia   .  Hypertension   . Irregular heartbeat 10/2016  . Left carotid stenosis   . Memory loss   . Osteoarthritis   . Reflux   . Tobacco abuse     Past Surgical History:  Procedure Laterality Date  . APPENDECTOMY    . CORONARY ARTERY BYPASS GRAFT    . ELBOW SURGERY    . RIGHT/LEFT HEART CATH AND CORONARY/GRAFT ANGIOGRAPHY N/A 05/28/2017   Procedure: Right/Left Heart Cath and Coronary/Graft Angiography;  Surgeon: Belva Crome, MD;  Location: [facility];  Service: Cardiovascular;  Laterality: N/A;    Family History  Problem Relation Age of Onset  . Heart disease Father     Social History   Social History  . Marital status: Widowed    Spouse name: N/A  . Number of children: 3  . Years of education: N/A   Occupational History  . Professor at [employer]    Social History Main Topics  . Smoking status: Current Every Day Smoker    Packs/day: 0.50    Years: 40.00    Types: Cigarettes  . Smokeless tobacco: Never Used     Comment: stopped for now, until surgery  . Alcohol use No  . Drug use: No  . Sexual activity: Not on file   Other Topics Concern  . Not on file   Social  History Narrative  . No narrative on file    Current Outpatient Prescriptions  Medication Sig Dispense Refill  . aspirin EC 81 MG tablet Take 1 tablet (81 mg total) by mouth daily.    [name] atorvastatin (LIPITOR) 80 MG tablet Take 80 mg by mouth daily.    . diphenhydrAMINE (BENADRYL) 25 MG tablet Take 25 mg by mouth at bedtime.     . donepezil (ARICEPT) 5 MG tablet Take 5 mg by mouth at bedtime.    . furosemide (LASIX) 20 MG tablet Take 1 tablet (20 mg total) by mouth daily. 90 tablet 3  . irbesartan (AVAPRO) 150 MG tablet Take 1 tablet (150 mg total) by mouth daily. 30 tablet 11  . memantine (NAMENDA) 10 MG tablet Take 10 mg by mouth 2 (two) times daily.    . metFORMIN (GLUCOPHAGE) 500 MG tablet TAKE 1 TABLET (500 MG TOTAL) BY MOUTH AT BEDTIME  5  . naproxen sodium (ANAPROX) 550 MG tablet Take 550  mg by mouth 2 (two) times daily with a meal.     . niacin (NIASPAN) 1000 MG CR tablet Take 1,000 mg by mouth at bedtime.    . Omega-3 Fatty Acids (FISH OIL) 1000 MG CAPS Take 1,000 mg by mouth 2 (two) times daily.    [name] omeprazole (PRILOSEC) 40 MG capsule Take 40 mg by mouth daily.    . potassium chloride (K-DUR) 10 MEQ tablet Take 1 tablet (10 mEq total) by mouth daily. 90 tablet 3  . propranolol ER (INDERAL LA) 120 MG 24 hr capsule Take 120 mg by mouth daily.    [name] Specialty Vitamins Products (PROSTATE) TABS Take 1 tablet by mouth daily.    . temazepam (RESTORIL) 15 MG capsule Take 15 mg by mouth at bedtime.      No current facility-administered medications for this visit.    Facility-Administered Medications Ordered in Other Visits  Medication Dose Route Frequency Provider Last Rate Last Dose  . [START ON 07/31/2017] 0.9 %  sodium chloride infusion   Intravenous Continuous Burnell Blanks, MD      . Derrill Memo ON 07/31/2017] chlorhexidine (PERIDEX) 0.12 % solution 15 mL  15 mL Mouth/Throat Once Gaye Pollack, MD      . Derrill Memo ON 07/31/2017] dexmedetomidine (PRECEDEX) 400 MCG/100ML (4 mcg/mL) infusion  0.1-0.7 mcg/kg/hr Intravenous To OR Gaye Pollack, MD      . Derrill Memo ON 07/31/2017] DOPamine (INTROPIN) 800 mg in dextrose 5 % 250 mL (3.2 mg/mL) infusion  0-10 mcg/kg/min Intravenous To OR Gaye Pollack, MD      . Derrill Memo ON 07/31/2017] EPINEPHrine (ADRENALIN) 4 mg in dextrose 5 % 250 mL (0.016 mg/mL) infusion  0-10 mcg/min Intravenous To OR Gaye Pollack, MD      . Derrill Memo ON 07/31/2017] heparin 30,000 units/NS 1000 mL solution for CELLSAVER   Other To OR Gaye Pollack, MD      . Derrill Memo ON 07/31/2017] insulin regular (NOVOLIN R,HUMULIN R) 100 Units in sodium chloride 0.9 % 100 mL (1 Units/mL) infusion   Intravenous To OR Gaye Pollack, MD      . Derrill Memo ON 07/31/2017] levofloxacin (LEVAQUIN) IVPB 500 mg  500 mg Intravenous To OR Gaye Pollack, MD      . Derrill Memo ON 07/31/2017] magnesium  sulfate (IV Push/IM) injection 40 mEq  40 mEq Other To OR Gaye Pollack, MD      . Derrill Memo ON 07/31/2017] nitroGLYCERIN 50 mg in dextrose 5 % 250 mL (  0.2 mg/mL) infusion  2-200 mcg/min Intravenous To OR Gaye Pollack, MD      . Derrill Memo ON 07/31/2017] norepinephrine (LEVOPHED) 4 mg in dextrose 5 % 250 mL (0.016 mg/mL) infusion  0-10 mcg/min Intravenous To OR Gaye Pollack, MD      . Derrill Memo ON 07/31/2017] phenylephrine (NEO-SYNEPHRINE) 20 mg in sodium chloride 0.9 % 250 mL (0.08 mg/mL) infusion  30-200 mcg/min Intravenous To OR Gaye Pollack, MD      . Derrill Memo ON 07/31/2017] potassium chloride injection 80 mEq  80 mEq Other To OR Gaye Pollack, MD      . Derrill Memo ON 07/31/2017] vancomycin (VANCOCIN) 1,250 mg in sodium chloride 0.9 % 250 mL IVPB  1,250 mg Intravenous To OR Gaye Pollack, MD        Allergies  Allergen Reactions  . Penicillins Swelling    SWELLING, HANDS and FEET  Has patient had a PCN reaction causing immediate rash, facial/tongue/throat swelling, SOB or lightheadedness with hypotension: No Has patient had a PCN reaction causing severe rash involving mucus membranes or skin necrosis: No Has patient had a PCN reaction that required hospitalization: No Has patient had a PCN reaction occurring within the last 10 years: No  . Sulfa Antibiotics Itching      Review of Systems:   General:  poor appetite, poor energy, no weight gain, has weight loss, no fever  Cardiac:  no chest pain with exertion, no chest pain at rest, has SOB with mild exertion, no resting SOB, no PND, no orthopnea, has palpitations, known PVC's, no atrial fibrillation, no LE edema, no dizzy spells, no syncope  Respiratory:  exertional shortness of breath, no home oxygen, no productive cough, no dry cough, no bronchitis, no wheezing, no hemoptysis, no asthma, no pain with inspiration or cough, no sleep apnea, no CPAP at night  GI:   no difficulty swallowing, no reflux, no frequent heartburn, no hiatal hernia, no  abdominal pain, no constipation, no diarrhea, no hematochezia, no hematemesis, no melena  GU:   no dysuria,  no frequency, no urinary tract infection, no hematuria, has enlarged prostate, no kidney stones, no kidney disease  Vascular:  no pain suggestive of claudication, no pain in feet, no leg cramps, no varicose veins, no DVT, no non-healing foot ulcer  Neuro:   no stroke, no TIA's, no seizures, no headaches, no temporary blindness one eye,  no slurred speech, no peripheral neuropathy, no chronic pain, no instability of gait, no memory/cognitive dysfunction  Musculoskeletal: no arthritis, no joint swelling, no myalgias, no difficulty walking, normal mobility   Skin:   no rash, no itching, no skin infections, no pressure sores or ulcerations  Psych:   no anxiety, no depression, no nervousness, no unusual recent stress  Eyes:   no blurry vision, no floaters, no recent vision changes,  wears glasses  ENT:   no hearing loss, no loose or painful teeth, no dentures, last saw dentist 6 months ago  Hematologic:  no easy bruising, no abnormal bleeding, no clotting disorder, no frequent epistaxis  Endocrine:  has diabetes, does not check CBG's at home        Physical Exam:  BP (!) 128/58   Pulse (!) 48   Resp 16   SpO2 99%    General:  Elderly but  well-appearing  HEENT:  Unremarkable, NCAT, PERLA, EOMI, oropharynx clear  Neck:   no JVD, no bruits, no adenopathy or thyromegaly  Chest:   clear to auscultation, symmetrical  breath sounds, no wheezes, no rhonchi   CV:   RRR, grade III/VI crescendo/decrescendo murmur heard best at RSB,  no diastolic murmur  Abdomen:  soft, non-tender, no masses or organomegaly  Extremities:  warm, well-perfused, pulses palpable in feet, no LE edema  Rectal/GU  Deferred  Neuro:   Grossly non-focal and symmetrical throughout  Skin:   Clean and dry, no rashes, no breakdown   Diagnostic Tests:                 [facility]                        [address], [place] [address]                            [phone]  ------------------------------------------------------------------- Transthoracic Echocardiography  Patient:    Khani, Paino MR #:       [id number] Study Date: 05/25/2017 Gender:     M Age:        40 Height:     188 cm Weight:     78.1 kg BSA:        2.01 m^2 Pt. Status: Room:   ATTENDING    Mertie Moores, M.D.  REFERRING    Sebastian Ache, Scott T  REFERRING    Kathlen Mody, Scott T  PERFORMING   [facility], Outpatient  SONOGRAPHER  [facility], RDCS  cc:  ------------------------------------------------------------------- LV EF: 55% -   60%  ------------------------------------------------------------------- Indications:      Aortic Stenosis (I35.0).  ------------------------------------------------------------------- History:   PMH:  Chronic Kidney Disease.  Dyspnea.  Coronary artery disease.  Risk factors:  Family history of coronary artery disease. Current tobacco use. Hypertension. Diabetes mellitus. Dyslipidemia.   ------------------------------------------------------------------- Study Conclusions  - Left ventricle: The cavity size was normal. Wall thickness was   increased in a pattern of moderate LVH. Systolic function was   normal. The estimated ejection fraction was in the range of 55%   to 60%. Wall motion was normal; there were no regional wall   motion abnormalities. Features are consistent with a pseudonormal   left ventricular filling pattern, with concomitant abnormal   relaxation and increased filling pressure (grade 2 diastolic   dysfunction). - Aortic valve: Moderately calcified annulus. Moderately thickened,   severely calcified leaflets. Cusp separation was severely   reduced. There was moderate to severe stenosis. There was mild   regurgitation. Valve area (VTI): 0.86 cm^2. Valve area (Vmax):   0.87 cm^2. Valve area  (Vmean): 0.73 cm^2. - Mitral valve: Mildly to moderately calcified annulus. There was   mild regurgitation. - Left atrium: The atrium was severely dilated. - Right ventricle: Systolic function was mildly reduced. - Right atrium: The atrium was moderately dilated.  ------------------------------------------------------------------- Labs, prior tests, procedures, and surgery: Coronary artery bypass grafting.  ECG.     Abnormal.  ------------------------------------------------------------------- Study data:  Comparison was made to the study of 09/26/2016.  Study status:  Routine.  Procedure:  Transthoracic echocardiography. Image quality was adequate.          Transthoracic echocardiography.  M-mode, complete 2D, spectral Doppler, and color Doppler.  Birthdate:  Patient birthdate: [birth year].  Age:  Patient is 83 yr old.  Sex:  Gender: male.    BMI: 22.1 kg/m^2.  Blood pressure:     198/85  Patient status:  Outpatient.  Study date: Study date: 05/25/2017. Study time: 03:09 PM.  Location:  [facility]  -------------------------------------------------------------------  ------------------------------------------------------------------- Left ventricle:  The cavity size was normal. Wall thickness was increased in a pattern of moderate LVH. Systolic function was normal. The estimated ejection fraction was in the range of 55% to 60%. Wall motion was normal; there were no regional wall motion abnormalities. Features are consistent with a pseudonormal left ventricular filling pattern, with concomitant abnormal relaxation and increased filling pressure (grade 2 diastolic dysfunction).  ------------------------------------------------------------------- Aortic valve:   Moderately calcified annulus. Moderately thickened, severely calcified leaflets. Cusp separation was severely reduced. Doppler:   There was moderate to severe stenosis.   There was mild regurgitation.    VTI  ratio of LVOT to aortic valve: 0.19. Valve area (VTI): 0.86 cm^2. Indexed valve area (VTI): 0.43 cm^2/m^2. Peak velocity ratio of LVOT to aortic valve: 0.19. Valve area (Vmax): 0.87 cm^2. Indexed valve area (Vmax): 0.43 cm^2/m^2. Mean velocity ratio of LVOT to aortic valve: 0.16. Valve area (Vmean): 0.73 cm^2. Indexed valve area (Vmean): 0.36 cm^2/m^2.    Mean gradient (S): 40 mm Hg. Peak gradient (S): 68 mm Hg.  ------------------------------------------------------------------- Aorta:  Aortic root: The aortic root was normal in size. Ascending aorta: The ascending aorta was normal in size.  ------------------------------------------------------------------- Mitral valve:   Mildly to moderately calcified annulus.  Doppler: There was mild regurgitation.  ------------------------------------------------------------------- Left atrium:  The atrium was severely dilated.  ------------------------------------------------------------------- Right ventricle:  The cavity size was normal. Systolic function was mildly reduced.  ------------------------------------------------------------------- Pulmonic valve:    The valve appears to be grossly normal. Doppler:  There was no significant regurgitation.  ------------------------------------------------------------------- Tricuspid valve:   The valve appears to be grossly normal. Doppler:  There was mild regurgitation.  ------------------------------------------------------------------- Right atrium:  The atrium was moderately dilated.  ------------------------------------------------------------------- Pericardium:  There was no pericardial effusion.  ------------------------------------------------------------------- Measurements   Left ventricle                           Value          Reference  LV ID, ED, PLAX chordal          (L)     42.3  mm       43 - 52  LV ID, ES, PLAX chordal                  31.9  mm       23 - 38   LV fx shortening, PLAX chordal   (L)     25    %        >=29  LV PW thickness, ED                      12.1  mm       ----------  IVS/LV PW ratio, ED                      1.23           <=1.3  Stroke volume, 2D                        93    ml       ----------  Stroke volume/bsa, 2D  46    ml/m^2   ----------    Ventricular septum                       Value          Reference  IVS thickness, ED                        14.9  mm       ----------    LVOT                                     Value          Reference  LVOT ID, S                               24    mm       ----------  LVOT area                                4.52  cm^2     ----------  LVOT peak velocity, S                    79.3  cm/s     ----------  LVOT mean velocity, S                    47.4  cm/s     ----------  LVOT VTI, S                              20.5  cm       ----------    Aortic valve                             Value          Reference  Aortic valve peak velocity, S            413   cm/s     ----------  Aortic valve mean velocity, S            292   cm/s     ----------  Aortic valve VTI, S                      108   cm       ----------  Aortic mean gradient, S                  40    mm Hg    ----------  Aortic peak gradient, S                  68    mm Hg    ----------  VTI ratio, LVOT/AV                       0.19           ----------  Aortic valve area, VTI                   0.86  cm^2     ----------  Aortic valve area/bsa, VTI  0.43  cm^2/m^2 ----------  Velocity ratio, peak, LVOT/AV            0.19           ----------  Aortic valve area, peak velocity         0.87  cm^2     ----------  Aortic valve area/bsa, peak              0.43  cm^2/m^2 ----------  velocity  Velocity ratio, mean, LVOT/AV            0.16           ----------  Aortic valve area, mean velocity         0.73  cm^2     ----------  Aortic valve area/bsa, mean              0.36  cm^2/m^2 ----------  velocity      Aorta                                    Value          Reference  Aortic root ID, ED                       38    mm       ----------  Ascending aorta ID, A-P, S               38    mm       ----------    Left atrium                              Value          Reference  LA ID, A-P, ES                           57    mm       ----------  LA ID/bsa, A-P                   (H)     2.83  cm/m^2   <=2.2  LA volume, S                             94.6  ml       ----------  LA volume/bsa, S                         47    ml/m^2   ----------  LA volume, ES, 1-p A4C                   97    ml       ----------  LA volume/bsa, ES, 1-p A4C               48.1  ml/m^2   ----------  LA volume, ES, 1-p A2C                   78.1  ml       ----------  LA volume/bsa, ES, 1-p A2C               38.8  ml/m^2   ----------    Tricuspid  valve                          Value          Reference  Tricuspid regurg peak velocity           235   cm/s     ----------  Tricuspid peak RV-RA gradient            22    mm Hg    ----------    Right atrium                             Value          Reference  RA ID, S-I, ES, A4C              (H)     73.1  mm       34 - 49  RA area, ES, A4C                 (H)     27.7  cm^2     8.3 - 19.5  RA volume, ES, A/L                       85    ml       ----------  RA volume/bsa, ES, A/L                   42.2  ml/m^2   ----------    Right ventricle                          Value          Reference  TAPSE                                    15.9  mm       ----------  Legend: (L)  and  (H)  mark values outside specified reference range.  ------------------------------------------------------------------- Prepared and Electronically Authenticated by  Mertie Moores, M.D. 2018-06-08T17:13:24   Physicians   Panel Physicians Referring Physician Case Authorizing Physician  Belva Crome, MD (Primary)    Procedures   Right/Left Heart Cath and Coronary/Graft Angiography   Conclusion    Patent bypass grafts including SVG to PDA, SVG to diagonal, and SVG to obtuse marginal.  Patent LIMA to LAD.  Severe native vessel disease with total occlusion of the mid RCA, occlusion of the ostial to proximal circumflex, occlusion of the first diagonal/ramus intermedius, and diffuse mid LAD disease up to 80%. Second diagonal contains diffuse 80-90% obstruction in the branch. Distal left main is 50% obstructed.  Ventricular ectopy (bigeminy) made hemodynamic assessment difficult. Pulmonary pressures are post PVC recordings and higher than would be expected in normal sinus rhythm due to post PVC contractility augmentation.[name] Post PVC systolic pressure gradient is 67 mmHg. Pulmonary wedge pressure mean is 26 mmHg.  Aortic valve stenosis with valvular gradient also influenced by ventricular bigeminy. Peak to peak gradient of approximately 35 mmHg (using post PVC pressure wave forms), and calculated aortic valve area 1.27 cm  Previous documentation of normal left ventricular systolic function by recent echocardiogram with EF greater than 55%.  RECOMMENDATIONS:   Pulmonary evaluation to rule out COPD as an explanation for the patient's  dyspnea.  Consider EP consultation to determine if ventricular ectopy could have a role in symptoms. We need a 24-48 hour monitor to quantitate the prevalence of PVCs.  Consider referral for consideration of TAVR if no explanation for dyspnea can be identified. Mean pulmonary wedge pressure of 26 mmHg with an end-diastolic left ventricular pressure of 15 mmHg raises a question of mitral valve involvement which was not identified on echocardiography. Therefore likely a sampling error related to ventricular ectopy.     29.0 32.8 300 [place]: Other: 43 1.27  Indications   DOE (dyspnea on exertion) [R06.09 (ICD-10-CM)]  Nonrheumatic aortic valve stenosis [I35.0 (ICD-10-CM)]  Coronary artery disease involving coronary bypass graft of  native heart without angina pectoris [I25.810 (ICD-10-CM)]  Procedural Details/Technique   Technical Details The right femoral was sterilely prepped and draped. 1% Xylocaine local infiltration was then given for analgesia. Sedation was administered. Using real-time vascular ultrasound, Seldinger needle was then used to perform an anterior wall femoral artery stick. Unfortunately, the entry site was quite low as the superficial femoral was entered while attempting to hit the femoral vein. A 5 French sheath was inserted using the modified Seldinger technique. Coronary angiography was then performed using a B2 MP, 5 [place] JL4, an internal mammary artery catheter and JR4. Left ventricular hemodynamics were assessed after crossing the aortic valve using the JR4 and a straight 0.035 wire. The pressure gradient was measured by pullback. Hemodynamic data will be difficult to interpret because of ventricular bigeminy. We attempted to suppress the ectopy with IV lidocaine and atropine without success. The left ventricle was not opacified. Previous documentation of normal LV function by recent echo.  Using real-time vascular ultrasound, the Seldinger needle was used to make several passes of the femoral vein. The vein could not be easily visualized. After several attempts and anterior wall stick was performed. A 7 French sheath was placed using the modified Seldinger technique. A 7 French Swan-Ganz catheter was then advanced into the pulmonary artery with difficulty, requiring a 0.025 guidewire for stiffening. An oximetry sample was obtained simultaneously with an aortic oximetry sample. The case was terminated.  Femoral vein and artery disease were pulled and hemostasis achieved with manual compression.  During this procedure the patient is administered a total of Versed 1 mg and Fentanyl 50 mg to achieve and maintain moderate conscious sedation. The patient's heart rate, blood pressure, and oxygen saturation are  monitored continuously during the procedure. The period of conscious sedation is 44 minutes, of which I was present face-to-face 100% of this time.   Estimated blood loss <50 mL.  During this procedure the patient was administered the following to achieve and maintain moderate conscious sedation: Versed 1 mg, Fentanyl 50 mcg, while the patient's heart rate, blood pressure, and oxygen saturation were continuously monitored. The period of conscious sedation was 44 minutes, of which I was present face-to-face 100% of this time.    Coronary Findings   Dominance: Right  Left Main  LM lesion, 50% stenosed.  Left Anterior Descending  Mid LAD to Dist LAD lesion, 50% stenosed.  Dist LAD lesion, 85% stenosed.  First Diagonal Branch  Vessel is small in size.  Ost 1st Diag lesion, 100% stenosed.  Second Diagonal Branch  Vessel is small in size.  Ost 2nd Diag to 2nd Diag lesion, 70% stenosed.  2nd Diag lesion, 80% stenosed.  Left Circumflex  Vessel is small.  Ost Cx to Prox Cx lesion, 100% stenosed.  First Obtuse Marginal Branch  Vessel  is moderate in size.  Right Coronary Artery  Mid RCA lesion, 100% stenosed.  Dist RCA lesion, 100% stenosed.  Right Posterior Atrioventricular Branch  Vessel is small in size.  Post Atrio lesion, 60% stenosed.  Graft Angiography  LIMA Graft to Dist LAD  saphenous Graft to Dist RCA  SVG.  Graft to 1st Mrg  saphenous Graft to 1st Diag  SVG.  Coronary Diagrams   Diagnostic Diagram       Implants     No implant documentation for this case.  PACS Images   Show images for Cardiac catheterization   Link to Procedure Log   Procedure Log    Hemo Data    Most Recent Value  Fick Cardiac Output 4.16 L/min  Fick Cardiac Output Index 2.06 (L/min)/BSA  Aortic Mean Gradient 32.8 mmHg  Aortic Peak Gradient 29 mmHg  Aortic Valve Area 1.27  Aortic Value Area Index 0.63 cm2/BSA  RA A Wave 10 mmHg  RA V Wave 11 mmHg  RA Mean 7 mmHg  RV Systolic  Pressure 66 mmHg  RV Diastolic Pressure 1 mmHg  RV EDP 7 mmHg  PA Systolic Pressure 67 mmHg  PA Diastolic Pressure 20 mmHg  PA Mean 40 mmHg  PW A Wave 38 mmHg  PW V Wave 0 mmHg  PW Mean 26 mmHg  AO Systolic Pressure 403 mmHg  AO Diastolic Pressure 63 mmHg  AO Mean 95 mmHg  LV Systolic Pressure 474 mmHg  LV Diastolic Pressure 6 mmHg  LV EDP 11 mmHg  Arterial Occlusion Pressure Extended Systolic Pressure 259 mmHg  Arterial Occlusion Pressure Extended Diastolic Pressure 60 mmHg  Arterial Occlusion Pressure Extended Mean Pressure 88 mmHg  Left Ventricular Apex Extended Systolic Pressure 563 mmHg  Left Ventricular Apex Extended Diastolic Pressure 10 mmHg  Left Ventricular Apex Extended EDP Pressure 15 mmHg  QP/QS 1  TPVR Index 19.44 HRUI  TSVR Index 43.25 HRUI  PVR SVR Ratio 0.17  TPVR/TSVR Ratio 0.45    CT CORONARY MORPH W/CTA COR W/SCORE W/CA W/CM &/OR WO/CM (Accession [id number]) (Order [id number])  Imaging  Date: 07/11/2017 Department: [facility] CT IMAGING Released By: Garth Bigness D Authorizing: Burnell Blanks, MD  Exam Information   Status Exam Begun  Exam Ended   Final [99] 07/11/2017 10:45 AM 07/11/2017 11:34 AM  PACS Images   Show images for CT CORONARY MORPH W/CTA COR W/SCORE W/CA W/CM &/OR WO/CM  Study Result   CLINICAL DATA:  83 year old male with history of severe aortic stenosis. Preprocedural study prior to potential transcatheter aortic valve replacement (TAVR) procedure.  EXAM: CT ANGIOGRAPHY OF THE HEART, CORONARY ARTERY, STRUCTURE, AND MORPHOLOGY  TECHNIQUE: CT angiography of the heart was performed on a Siemens Force CT scanner using retrospective ECG gating. A scout and ECG-gated noncontrast exam (for calcium scoring) were performed. Appropriate delay was determined by bolus tracking after injection of iodinated contrast, and an ECG-gated cardiac CTA was performed with sub-mm slice collimation. Imaging post processing  was performed on an independent work station creating multiplanar and 3-D images, allowing for quantitative analysis of the heart and coronary arteries. Note that this exam targets the heart and the chest was not imaged in its entirety.  CONTRAST:  < 95 mL of Isovue 370.  >  COMPARISON:  None.  MEDICATIONS: Lopressor 5 mg, IV  FINDINGS: Technical quality:  Excellent.  Heart rate:  55.  CORONARY ARTERIES:  Coronary arteries were not evaluated on today's examination given the severity of calcified coronary  artery atherosclerosis, and recent cardiac catheterization.  AORTIC ROOT FINDINGS/MEASUREMENTS PERTINENT TO TAVR:  Aortic Valve Description: Tri-leaflet aortic valve is severely thickened and densely calcified with limited opening of all 3 leaflets during systole. During peak systole, aortic valve area is estimated at 0.65 cm squared by planimetry.  Annulus:  Planimetry - 5.21 cm2 (calculated mean diameter 25.8 mm)  Long & Short Axis - 28.2 x 24.1 mm (calculated mean diameter 26.2 mm)  Circumference - 82.2 mm (calculated mean diameter 26.2 mm)  Sinuses of Valsalva:  R SOV -       width 33.5 mm  height 22.3 mm  L SOV -        width 34.0 mm  height 23.4 mm   SOV -     width 34.9 mm  height 18.3 mm  Coronary Artery [place]:  L main - 15.5 mm from the annulus  RCA - 13.9 mm from the annulus  OTHER FINDINGS:  Please see separate dictation for contemporaneously obtained CTA of the chest, abdomen and pelvis for full description of relevant extracardiac findings.  IMPRESSION: 1. Severely thickened and densely calcified trileaflet aortic valve with estimated aortic valve area of approximately 0.65 cm square during planimetry, and calculated annular mean diameter ranging from 25.8-26.2 mm. 2. Additional findings and measurements relevant to potential TAVR procedure, as above.   Electronically Signed   By: Vinnie Langton  M.D.   On: 07/11/2017 13:05     CT Angio Abd/Pel w/ and/or w/o (Accession [id number]) (Order [id number])  Imaging  Date: 07/11/2017 Department: [facility] CT IMAGING Released By: Ermalinda Barrios Authorizing: Burnell Blanks, MD  Exam Information   Status Exam Begun  Exam Ended   Final [99] 07/11/2017 10:45 AM 07/11/2017 11:34 AM  PACS Images   Show images for CT Angio Abd/Pel w/ and/or w/o  Study Result   CLINICAL DATA:  83 year old male with history of severe aortic stenosis. Preprocedural study prior to potential transcatheter aortic valve replacement (TAVR) procedure.  EXAM: CT ANGIOGRAPHY CHEST, ABDOMEN AND PELVIS  TECHNIQUE: Multidetector CT imaging through the chest, abdomen and pelvis was performed using the standard protocol during bolus administration of intravenous contrast. Multiplanar reconstructed images and MIPs were obtained and reviewed to evaluate the vascular anatomy.  CONTRAST:  95 mL of Isovue 370.  COMPARISON:  No priors.  FINDINGS: CTA CHEST FINDINGS  Cardiovascular: Heart size is enlarged. There is no significant pericardial fluid, thickening or pericardial calcification. There is aortic atherosclerosis, as well as atherosclerosis of the great vessels of the mediastinum and the coronary arteries, including calcified atherosclerotic plaque in the left main, left anterior descending, left circumflex and right coronary arteries. Status post median sternotomy for CABG including LIMA to the LAD.  Mediastinum/Lymph Nodes: No pathologically enlarged mediastinal or hilar lymph nodes. Esophagus is unremarkable in appearance. No axillary lymphadenopathy.  Lungs/Pleura: No acute consolidative airspace disease. No pleural effusions. No suspicious appearing pulmonary nodules or masses. Scattered areas of mild scarring noted throughout the lower lobes of the lungs bilaterally.  Musculoskeletal/Soft Tissues: Median  sternotomy wires. There are no aggressive appearing lytic or blastic lesions noted in the visualized portions of the skeleton.  CTA ABDOMEN AND PELVIS FINDINGS  Hepatobiliary: No cystic or solid hepatic lesions. No intra or extrahepatic biliary ductal dilatation. Small calcified gallstones lying dependently in the gallbladder. Gallbladder is otherwise normal in appearance.  Pancreas: No pancreatic mass. No pancreatic ductal dilatation. No pancreatic or peripancreatic fluid or inflammatory changes.  Spleen: Unremarkable.  Adrenals/Urinary Tract: 12 mm nonobstructive calculus in the lower pole collecting system of the right kidney. 6 mm nonobstructive calculus in the lower pole collecting system of the left kidney. Extensive cortical thinning in the lower pole of the right kidney. Several other scattered areas of mild cortical thinning noted throughout the kidneys bilaterally. 1.3 cm exophytic lesion in the upper pole of the right kidney is low-attenuation and does not enhance, compatible with a simple cysts. No aggressive appearing renal lesions are noted. No hydroureteronephrosis. In the left side of the urinary bladder at or adjacent to the left ureterovesicular junction there is a 1.9 x 1.3 x 1.2 cm calculus (axial image 195 of series 15). 1.5 cm diverticulum extending from the lateral aspect of the right side of the urinary bladder.  Stomach/Bowel: The appearance of the stomach is normal. There is no pathologic dilatation of small bowel or colon. Numerous colonic diverticulae are noted, most pronounced in the descending colon and sigmoid colon, without surrounding inflammatory changes to suggest an acute diverticulitis at this time. Normal appendix.  Vascular/Lymphatic: Aortic atherosclerosis with vascular findings and measurements pertinent to potential TAVR procedure, as detailed below. Fusiform aneurysmal dilatation of the infrarenal abdominal aorta which measures  up to 3.2 x 3.3 cm. Celiac axis, superior mesenteric artery and inferior mesenteric artery are all widely patent without hemodynamically significant stenosis. Two right renal arteries are noted, the larger and more inferior of which demonstrates mild stenosis at the ostium. Single left renal artery noted with mild proximal stenosis. 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 No lymphadenopathy noted in the abdomen or pelvis.  Reproductive: Prostate gland and seminal vesicles are unremarkable in appearance.  Other: No significant volume of ascites.  No pneumoperitoneum.  Musculoskeletal: There are no aggressive appearing lytic or blastic lesions noted in the visualized portions of the skeleton.  VASCULAR MEASUREMENTS PERTINENT TO TAVR:  Comment: Suboptimal contrast bolus in distal right external iliac artery and right common femoral artery slightly limits assessment.  AORTA:  Minimal Aortic Diameter -  14 x 13 mm  Severity of Aortic Calcification -  severe  RIGHT PELVIS:  Right Common Iliac Artery -  Minimal Diameter - 9.4 x 8.0 mm  Tortuosity - moderate  Calcification - moderate  Comment - Ectasia (1.4 cm in diameter) and short segment dissection of the proximal right common iliac artery.  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  Left External Iliac Artery -  Minimal Diameter - 7.3 x 7.1 mm  Tortuosity - severe  Calcification - none  Left Common Femoral Artery -  Minimal Diameter - 7.1 x 8.3  mm  Tortuosity - mild  Calcification - mild  Review of the MIP images confirms the above findings.  IMPRESSION: 1. Vascular findings and measurements relevant to potential TAVR procedure, as detailed above. Based on size, the patient has suitable pelvic arterial access bilaterally. 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 2. Severe thickening and calcification  of the aortic valve, compatible with the reported clinical history of severe aortic stenosis. 3. Aortic atherosclerosis, in addition to left main and 3 vessel coronary artery disease. Status post median sternotomy for CABG including LIMA to the LAD. 4. Mild fusiform aneurysmal dilatation of the infrarenal abdominal aorta which measures up to 3.3 x 3.2 cm. Recommend followup by ultrasound in 3 years. This recommendation follows ACR consensus guidelines: White Paper of the ACR Incidental Findings Committee II on Vascular Findings. J Am Coll Radiol 2013; 10:789-794. 5. Large calculus in the left side of the urinary bladder at or adjacent to the left ureterovesicular junction. There is no associated hydroureteronephrosis to indicate urinary tract obstruction at this time. There also nonobstructive calculi in the lower pole collecting systems of the kidneys bilaterally measuring up to 12 mm in the lower pole the right kidney where there is extensive overlying scarring. 6. Cholelithiasis without evidence of acute cholecystitis at this time. 7. Colonic diverticulosis without evidence to suggest an acute diverticulitis at this time. 8. Additional incidental findings, as above.  Aortic Atherosclerosis (ICD10-I70.0).   Electronically Signed   By: Vinnie Langton M.D.   On: 07/11/2017 14:21   STS Risk Score Risk of Mortality: 6.635%  Morbidity or Mortality: 26.641%   Long Length of Stay: 11.997%  Short Length of Stay: 20.805%  Permanent Stroke: 2.515%  Prolonged Ventilation: 16.357%  DSW Infection: 0.502%  Renal Failure: 9.862%  Reoperation: 9.73%   Impression:  This 83 year old gentleman has stage D, severe, symptomatic aortic stenosis with progressive exertional fatigue and shortness of breath, NYHA class II, consistent with chronic diastolic heart failure. I have personally reviewed his echo, cath and CT studies. His echo shows a trileaflet aortic valve with severe leaflet calcification and severely reduced mobility with a mean gradient of 40 mm Hg and an AVA of 0.87 cm2. LV function is normal. His cath shows that all 4 bypass grafts are patent without any significant ischemic territories. I agree that AVR is indicated in this patient but he would be at high risk for a redo sternotomy and AVR at 83 years old. I think TAVR is the best option for him. His gated cardiac CT shows anatomy suitable for a 26 mm Sapien 3 valve. 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   The patient was counseled at length regarding treatment alternatives for management of severe symptomatic aortic stenosis. The risks and benefits of surgical intervention has been discussed in detail. Long-term prognosis with medical therapy was discussed. Alternative approaches such as conventional surgical aortic valve replacement, transcatheter aortic valve replacement, and palliative medical therapy were compared and contrasted at length. This discussion was placed in the context of the patient's own specific clinical presentation and past medical history. All of his questions have been addressed. The patient is eager to proceed with TAVR.  Following the decision to proceed with transcatheter aortic valve replacement, a discussion was held regarding what types of  management strategies would be attempted intraoperatively in the event of life-threatening complications, including whether or not the patient would be considered a candidate for the use of cardiopulmonary bypass and/or conversion to open sternotomy for attempted surgical intervention. The patient is aware of the fact that transient use of cardiopulmonary bypass may be necessary, but the patient specifically states that she would not wish to undergo redo median sternotomy under any circumstances, even if she were  to develop potentially lethal complications related to transcatheter valves appointment.   The patient has been advised of a variety of complications that might develop including but not limited to risks of death, stroke, paravalvular leak, aortic dissection or other major vascular complications, aortic annulus rupture, device embolization, cardiac rupture or perforation, mitral regurgitation, acute myocardial infarction, arrhythmia, heart block or bradycardia requiring permanent pacemaker placement, congestive heart failure, respiratory failure, renal failure, pneumonia, infection, other late complications related to structural valve deterioration or migration, or other complications that might ultimately cause a temporary or permanent loss of functional independence or other long term morbidity. The patient provides full informed consent for the procedure as described and all questions were answered.     Plan:  Transfemoral TAVR on Tuesday 07/31/2017  I spent 60 minutes performing this consultation and > 50% of this time was spent face to face counseling and coordinating the care of this patient's severe aortic stenosis.    Gaye Pollack, MD 07/25/2017

## 2017-07-30 NOTE — H&P (Signed)
[address]       [place] [address]             [phone]      Cardiothoracic Surgery Admission History and Physical   Referring Provider is Belva Crome, MD PCP is Bernerd Limbo, MD  Reason for admission: severe aortic stenosis   HPI:  The patient is an 83 year old gentleman with a history of DM, hypertension, hyperlipidemia, chronic kidney disease, aortic stenosis and CAD s/p acute inferior STEMI with PCI of the RCA followed by CABG x 4 in 2005. He has a several month history of progressive shortness of breath with exertion and an echo on 05/25/2017 showed severe AS with a mean gradient of 40 mm Hg with an EF of 55-60%. His previous echo in 09/2016 showed a mean gradient of 32 mm Hg. He underwent cath on 05/28/2017 which showed patent grafts to the PDA, diagonal, and OM with a patent LIMA to the LAD. There was severe native vessel CAD. The peak to peak gradient was 35 mm Hg and calculated AVA 1.27 cm2. He was referred for pulmonary evaluation which was done by Dr. Melvyn Novas and PFT's were normal.  He has had SOB with exertion for at least 6 months. His wife died in March 01, 2017 of pancreatic cancer and he really did not think about the symptoms prior to that because he was so busy with her. He has been fatigued and has no energy. He denies orthopnea, dizziness, peripheral edema.       Past Medical History:  Diagnosis Date  . AAA (abdominal aortic aneurysm) (HCC)    3.3 cm infrarenal AAA 06/2017 CTA; 3 year f/u rec.  . Allergic rhinitis   . Aortic stenosis    a. Mild-mod by echo 01/2014. // b. Echo 6/18: Mod LVH EF 55-60, normal wall motion, Gr 2 DD, mod to severe AS (mean 40, peak 68; AVA by mean velocity 0.73 cm), mild AI, mod MAC, mild MR, severe LAE, mildly reduced RVSF, mod RAE  . CKD (chronic kidney disease), stage II   . Coronary artery disease    a. acute inferior MI s/p PTCA of RCA followed by CABGx4 in 2005,  . Diabetes mellitus ([place])   . ED  (erectile dysfunction)   . Hyperlipidemia   . Hypertension   . Irregular heartbeat 10/2016  . Left carotid stenosis   . Memory loss   . Osteoarthritis   . Reflux   . Tobacco abuse          Past Surgical History:  Procedure Laterality Date  . APPENDECTOMY    . CORONARY ARTERY BYPASS GRAFT    . ELBOW SURGERY    . RIGHT/LEFT HEART CATH AND CORONARY/GRAFT ANGIOGRAPHY N/A 05/28/2017   Procedure: Right/Left Heart Cath and Coronary/Graft Angiography;  Surgeon: Belva Crome, MD;  Location: [facility];  Service: Cardiovascular;  Laterality: N/A;         Family History  Problem Relation Age of Onset  . Heart disease Father     Social History        Social History  . Marital status: Widowed    Spouse name: N/A  . Number of children: 3  . Years of education: N/A       Occupational History  . Professor at [employer]          Social History Main Topics  . Smoking status: Current Every Day Smoker    Packs/day:  0.50    Years: 40.00    Types: Cigarettes  . Smokeless tobacco: Never Used     Comment: stopped for now, until surgery  . Alcohol use No  . Drug use: No  . Sexual activity: Not on file       Other Topics Concern  . Not on file   Social History Narrative  . No narrative on file          Current Outpatient Prescriptions  Medication Sig Dispense Refill  . aspirin EC 81 MG tablet Take 1 tablet (81 mg total) by mouth daily.    [name] atorvastatin (LIPITOR) 80 MG tablet Take 80 mg by mouth daily.    . diphenhydrAMINE (BENADRYL) 25 MG tablet Take 25 mg by mouth at bedtime.     . donepezil (ARICEPT) 5 MG tablet Take 5 mg by mouth at bedtime.    . furosemide (LASIX) 20 MG tablet Take 1 tablet (20 mg total) by mouth daily. 90 tablet 3  . irbesartan (AVAPRO) 150 MG tablet Take 1 tablet (150 mg total) by mouth daily. 30 tablet 11  . memantine (NAMENDA) 10 MG tablet Take 10 mg by mouth 2 (two) times daily.    .  metFORMIN (GLUCOPHAGE) 500 MG tablet TAKE 1 TABLET (500 MG TOTAL) BY MOUTH AT BEDTIME  5  . naproxen sodium (ANAPROX) 550 MG tablet Take 550 mg by mouth 2 (two) times daily with a meal.     . niacin (NIASPAN) 1000 MG CR tablet Take 1,000 mg by mouth at bedtime.    . Omega-3 Fatty Acids (FISH OIL) 1000 MG CAPS Take 1,000 mg by mouth 2 (two) times daily.    [name] omeprazole (PRILOSEC) 40 MG capsule Take 40 mg by mouth daily.    . potassium chloride (K-DUR) 10 MEQ tablet Take 1 tablet (10 mEq total) by mouth daily. 90 tablet 3  . propranolol ER (INDERAL LA) 120 MG 24 hr capsule Take 120 mg by mouth daily.    [name] Specialty Vitamins Products (PROSTATE) TABS Take 1 tablet by mouth daily.    . temazepam (RESTORIL) 15 MG capsule Take 15 mg by mouth at bedtime.      No current facility-administered medications for this visit.             Facility-Administered Medications Ordered in Other Visits  Medication Dose Route Frequency Provider Last Rate Last Dose  . [START ON 07/31/2017] 0.9 %  sodium chloride infusion   Intravenous Continuous Burnell Blanks, MD      . Derrill Memo ON 07/31/2017] chlorhexidine (PERIDEX) 0.12 % solution 15 mL  15 mL Mouth/Throat Once Gaye Pollack, MD      . Derrill Memo ON 07/31/2017] dexmedetomidine (PRECEDEX) 400 MCG/100ML (4 mcg/mL) infusion  0.1-0.7 mcg/kg/hr Intravenous To OR Gaye Pollack, MD      . Derrill Memo ON 07/31/2017] DOPamine (INTROPIN) 800 mg in dextrose 5 % 250 mL (3.2 mg/mL) infusion  0-10 mcg/kg/min Intravenous To OR Gaye Pollack, MD      . Derrill Memo ON 07/31/2017] EPINEPHrine (ADRENALIN) 4 mg in dextrose 5 % 250 mL (0.016 mg/mL) infusion  0-10 mcg/min Intravenous To OR Gaye Pollack, MD      . Derrill Memo ON 07/31/2017] heparin 30,000 units/NS 1000 mL solution for CELLSAVER   Other To OR Gaye Pollack, MD      . Derrill Memo ON 07/31/2017] insulin regular (NOVOLIN R,HUMULIN R) 100 Units in sodium chloride 0.9 % 100 mL (1 Units/mL) infusion  Intravenous To OR  Gaye Pollack, MD      . Derrill Memo ON 07/31/2017] levofloxacin (LEVAQUIN) IVPB 500 mg  500 mg Intravenous To OR Gaye Pollack, MD      . Derrill Memo ON 07/31/2017] magnesium sulfate (IV Push/IM) injection 40 mEq  40 mEq Other To OR Gaye Pollack, MD      . Derrill Memo ON 07/31/2017] nitroGLYCERIN 50 mg in dextrose 5 % 250 mL (0.2 mg/mL) infusion  2-200 mcg/min Intravenous To OR Gaye Pollack, MD      . Derrill Memo ON 07/31/2017] norepinephrine (LEVOPHED) 4 mg in dextrose 5 % 250 mL (0.016 mg/mL) infusion  0-10 mcg/min Intravenous To OR Gaye Pollack, MD      . Derrill Memo ON 07/31/2017] phenylephrine (NEO-SYNEPHRINE) 20 mg in sodium chloride 0.9 % 250 mL (0.08 mg/mL) infusion  30-200 mcg/min Intravenous To OR Gaye Pollack, MD      . Derrill Memo ON 07/31/2017] potassium chloride injection 80 mEq  80 mEq Other To OR Gaye Pollack, MD      . Derrill Memo ON 07/31/2017] vancomycin (VANCOCIN) 1,250 mg in sodium chloride 0.9 % 250 mL IVPB  1,250 mg Intravenous To OR Gaye Pollack, MD             Allergies  Allergen Reactions  . Penicillins Swelling    SWELLING, HANDS and FEET  Has patient had a PCN reaction causing immediate rash, facial/tongue/throat swelling, SOB or lightheadedness with hypotension: No Has patient had a PCN reaction causing severe rash involving mucus membranes or skin necrosis: No Has patient had a PCN reaction that required hospitalization: No Has patient had a PCN reaction occurring within the last 10 years: No  . Sulfa Antibiotics Itching      Review of Systems:              General:                      poor appetite, poor energy, no weight gain, has weight loss, no fever             Cardiac:                       no chest pain with exertion, no chest pain at rest, has SOB with mild exertion, no resting SOB, no PND, no orthopnea, has palpitations, known PVC's, no atrial fibrillation, no LE edema, no dizzy spells, no syncope             Respiratory:                 exertional  shortness of breath, no home oxygen, no productive cough, no dry cough, no bronchitis, no wheezing, no hemoptysis, no asthma, no pain with inspiration or cough, no sleep apnea, no CPAP at night             GI:                               no difficulty swallowing, no reflux, no frequent heartburn, no hiatal hernia, no abdominal pain, no constipation, no diarrhea, no hematochezia, no hematemesis, no melena             GU:                              no dysuria,  no frequency, no urinary tract infection, no hematuria, has enlarged prostate, no kidney stones, no kidney disease             Vascular:                     no pain suggestive of claudication, no pain in feet, no leg cramps, no varicose veins, no DVT, no non-healing foot ulcer             Neuro:                         no stroke, no TIA's, no seizures, no headaches, no temporary blindness one eye,  no slurred speech, no peripheral neuropathy, no chronic pain, no instability of gait, no memory/cognitive dysfunction             Musculoskeletal:         no arthritis, no joint swelling, no myalgias, no difficulty walking, normal mobility              Skin:                            no rash, no itching, no skin infections, no pressure sores or ulcerations             Psych:                         no anxiety, no depression, no nervousness, no unusual recent stress             Eyes:                           no blurry vision, no floaters, no recent vision changes,  wears glasses            ENT:                            no hearing loss, no loose or painful teeth, no dentures, last saw dentist 6 months ago             Hematologic:               no easy bruising, no abnormal bleeding, no clotting disorder, no frequent epistaxis             Endocrine:                   has diabetes, does not check CBG's at home                                         Physical Exam:  BP (!) 128/58   Pulse (!) 48   Resp 16   SpO2 99%               General:                       Elderly but  well-appearing             HEENT:                       Unremarkable, NCAT, PERLA, EOMI, oropharynx clear  Neck:                           no JVD, no bruits, no adenopathy or thyromegaly             Chest:                          clear to auscultation, symmetrical breath sounds, no wheezes, no rhonchi              CV:                              RRR, grade III/VI crescendo/decrescendo murmur heard best at RSB,  no diastolic murmur             Abdomen:                    soft, non-tender, no masses or organomegaly             Extremities:                 warm, well-perfused, pulses palpable in feet, no LE edema             Rectal/GU                   Deferred             Neuro:                         Grossly non-focal and symmetrical throughout             Skin:                            Clean and dry, no rashes, no breakdown   Diagnostic Tests:  [facility] [address] [place], [place] [address] [phone]  ------------------------------------------------------------------- Transthoracic Echocardiography  Patient: Shann, Merrick MR #: [id number] Study Date: 05/25/2017 Gender: M Age: 38 Height: 188 cm Weight: 78.1 kg BSA: 2.01 m^2 Pt. Status: Room:  ATTENDING Mertie Moores, M.D. REFERRING Sebastian Ache, Scott T REFERRING Kathlen Mody, Scott T PERFORMING [facility], Outpatient SONOGRAPHER [facility], [facility]  cc:  ------------------------------------------------------------------- LV EF: 55% - 60%  ------------------------------------------------------------------- Indications: Aortic Stenosis (I35.0).  ------------------------------------------------------------------- History: PMH: Chronic Kidney Disease. Dyspnea. Coronary  artery disease. Risk factors: Family history of coronary artery disease. Current tobacco use. Hypertension. Diabetes mellitus. Dyslipidemia.  ------------------------------------------------------------------- Study Conclusions  - Left ventricle: The cavity size was normal. Wall thickness was increased in a pattern of moderate LVH. Systolic function was normal. The estimated ejection fraction was in the range of 55% to 60%. Wall motion was normal; there were no regional wall motion abnormalities. Features are consistent with a pseudonormal left ventricular filling pattern, with concomitant abnormal relaxation and increased filling pressure (grade 2 diastolic dysfunction). - Aortic valve: Moderately calcified annulus. Moderately thickened, severely calcified leaflets. Cusp separation was severely reduced. There was moderate to severe stenosis. There was mild regurgitation. Valve area (VTI): 0.86 cm^2. Valve area (Vmax): 0.87 cm^2. Valve area (Vmean): 0.73 cm^2. - Mitral valve: Mildly to moderately calcified annulus. There was mild regurgitation. - Left atrium: The atrium was severely dilated. - Right ventricle: Systolic function was mildly reduced. - Right atrium: The atrium was moderately dilated.  -------------------------------------------------------------------  Labs, prior tests, procedures, and surgery: Coronary artery bypass grafting.  ECG. Abnormal.  ------------------------------------------------------------------- Study data: Comparison was made to the study of 09/26/2016. Study status: Routine. Procedure: Transthoracic echocardiography. Image quality was adequate. Transthoracic echocardiography. M-mode, complete 2D, spectral Doppler, and color Doppler. Birthdate: Patient birthdate: [birth year]. Age: Patient is 83 yr old. Sex: Gender: male. BMI: 22.1 kg/m^2. Blood pressure: 198/85 Patient status:  Outpatient. Study date: Study date: 05/25/2017. Study time: 03:09 PM. Location: [facility]  -------------------------------------------------------------------  ------------------------------------------------------------------- Left ventricle: The cavity size was normal. Wall thickness was increased in a pattern of moderate LVH. Systolic function was normal. The estimated ejection fraction was in the range of 55% to 60%. Wall motion was normal; there were no regional wall motion abnormalities. Features are consistent with a pseudonormal left ventricular filling pattern, with concomitant abnormal relaxation and increased filling pressure (grade 2 diastolic dysfunction).  ------------------------------------------------------------------- Aortic valve: Moderately calcified annulus. Moderately thickened, severely calcified leaflets. Cusp separation was severely reduced. Doppler: There was moderate to severe stenosis. There was mild regurgitation. VTI ratio of LVOT to aortic valve: 0.19. Valve area (VTI): 0.86 cm^2. Indexed valve area (VTI): 0.43 cm^2/m^2. Peak velocity ratio of LVOT to aortic valve: 0.19. Valve area (Vmax): 0.87 cm^2. Indexed valve area (Vmax): 0.43 cm^2/m^2. Mean velocity ratio of LVOT to aortic valve: 0.16. Valve area (Vmean): 0.73 cm^2. Indexed valve area (Vmean): 0.36 cm^2/m^2. Mean gradient (S): 40 mm Hg. Peak gradient (S): 68 mm Hg.  ------------------------------------------------------------------- Aorta: Aortic root: The aortic root was normal in size. Ascending aorta: The ascending aorta was normal in size.  ------------------------------------------------------------------- Mitral valve: Mildly to moderately calcified annulus. Doppler: There was mild regurgitation.  ------------------------------------------------------------------- Left atrium: The atrium was severely  dilated.  ------------------------------------------------------------------- Right ventricle: The cavity size was normal. Systolic function was mildly reduced.  ------------------------------------------------------------------- Pulmonic valve: The valve appears to be grossly normal. Doppler: There was no significant regurgitation.  ------------------------------------------------------------------- Tricuspid valve: The valve appears to be grossly normal. Doppler: There was mild regurgitation.  ------------------------------------------------------------------- Right atrium: The atrium was moderately dilated.  ------------------------------------------------------------------- Pericardium: There was no pericardial effusion.  ------------------------------------------------------------------- Measurements  Left ventricle Value Reference LV ID, ED, PLAX chordal (L) 42.3 mm 43 - 52 LV ID, ES, PLAX chordal 31.9 mm 23 - 38 LV fx shortening, PLAX chordal (L) 25 % >=29 LV PW thickness, ED 12.1 mm ---------- IVS/LV PW ratio, ED 1.23 <=1.3 Stroke volume, 2D 93 ml ---------- Stroke volume/bsa, 2D 46 ml/m^2 ----------  Ventricular septum Value Reference IVS thickness, ED 14.9 mm ----------  LVOT Value Reference LVOT ID, S 24 mm ---------- LVOT area 4.52 cm^2 ---------- LVOT peak velocity, S 79.3 cm/s ---------- LVOT mean velocity, S 47.4 cm/s ---------- LVOT VTI, S 20.5 cm  ----------  Aortic valve Value Reference Aortic valve peak velocity, S 413 cm/s ---------- Aortic valve mean velocity, S 292 cm/s ---------- Aortic valve VTI, S 108 cm ---------- Aortic mean gradient, S 40 mm Hg ---------- Aortic peak gradient, S 68 mm Hg ---------- VTI ratio, LVOT/AV 0.19 ---------- Aortic valve area, VTI 0.86 cm^2 ---------- Aortic valve area/bsa, VTI 0.43 cm^2/m^2 ---------- Velocity ratio, peak, LVOT/AV 0.19 ---------- Aortic valve area, peak velocity 0.87 cm^2 ---------- Aortic valve area/bsa, peak 0.43 cm^2/m^2 ---------- velocity Velocity ratio, mean, LVOT/AV 0.16 ---------- Aortic valve area, mean velocity 0.73 cm^2 ---------- Aortic valve area/bsa, mean 0.36 cm^2/m^2 ---------- velocity  Aorta Value Reference Aortic root ID, ED 38 mm ---------- Ascending aorta ID, A-P, S 38 mm ----------  Left atrium Value  Reference LA ID, A-P, ES 57 mm ---------- LA ID/bsa, A-P (H) 2.83 cm/m^2 <=2.2 LA volume, S 94.6 ml ---------- LA volume/bsa, S 47 ml/m^2 ---------- LA volume, ES, 1-p A4C 97 ml ---------- LA volume/bsa, ES, 1-p A4C 48.1 ml/m^2 ---------- LA volume, ES, 1-p A2C 78.1 ml ---------- LA volume/bsa, ES, 1-p A2C 38.8 ml/m^2 ----------  Tricuspid valve  Value Reference Tricuspid regurg peak velocity 235 cm/s ---------- Tricuspid peak RV-RA gradient 22 mm Hg ----------  Right atrium Value Reference RA ID, S-I, ES, A4C (H) 73.1 mm 34 - 49 RA area, ES, A4C (H) 27.7 cm^2 8.3 - 19.5 RA volume, ES, A/L 85 ml ---------- RA volume/bsa, ES, A/L 42.2 ml/m^2 ----------  Right ventricle Value Reference TAPSE 15.9 mm ----------  Legend: (L) and (H) mark values outside specified reference range.  ------------------------------------------------------------------- Prepared and Electronically Authenticated by  Mertie Moores, M.D. 2018-06-08T17:13:24   Physicians   Panel Physicians Referring Physician Case Authorizing Physician  Belva Crome, MD (Primary)    Procedures   Right/Left Heart Cath and Coronary/Graft Angiography  Conclusion    Patent bypass grafts including SVG to PDA, SVG to diagonal, and SVG to obtuse marginal.  Patent LIMA to LAD.  Severe native vessel disease with total occlusion of the mid RCA, occlusion of the ostial to proximal circumflex, occlusion of the first diagonal/ramus intermedius, and diffuse mid LAD disease up to 80%. Second diagonal contains diffuse 80-90% obstruction in the branch. Distal left main is 50% obstructed.  Ventricular ectopy (bigeminy) made hemodynamic assessment difficult. Pulmonary pressures are post PVC recordings and higher than would be expected in normal sinus rhythm due to post PVC contractility augmentation.[name] Post PVC systolic pressure gradient is 67 mmHg. Pulmonary wedge pressure mean is 26 mmHg.  Aortic valve stenosis with valvular gradient also influenced by ventricular bigeminy. Peak to  peak gradient of approximately 35 mmHg (using post PVC pressure wave forms), and calculated aortic valve area 1.27 cm  Previous documentation of normal left ventricular systolic function by recent echocardiogram with EF greater than 55%.  RECOMMENDATIONS:   Pulmonary evaluation to rule out COPD as an explanation for the patient's dyspnea.  Consider EP consultation to determine if ventricular ectopy could have a role in symptoms. We need a 24-48 hour monitor to quantitate the prevalence of PVCs.  Consider referral for consideration of TAVR if no explanation for dyspnea can be identified. Mean pulmonary wedge pressure of 26 mmHg with an end-diastolic left ventricular pressure of 15 mmHg raises a question of mitral valve involvement which was not identified on echocardiography. Therefore likely a sampling error related to ventricular ectopy.     29.0 32.8 300 [place]: Other: 43 1.27  Indications   DOE (dyspnea on exertion) [R06.09 (ICD-10-CM)]  Nonrheumatic aortic valve stenosis [I35.0 (ICD-10-CM)]  Coronary artery disease involving coronary bypass graft of native heart without angina pectoris [I25.810 (ICD-10-CM)]  Procedural Details/Technique   Technical Details The right femoral was sterilely prepped and draped. 1% Xylocaine local infiltration was then given for analgesia. Sedation was administered. Using real-time vascular ultrasound, Seldinger needle was then used to perform an anterior wall femoral artery stick. Unfortunately, the entry site was quite low as the superficial femoral was entered while attempting to hit the femoral vein. A 5 French sheath was inserted using the modified Seldinger technique. Coronary angiography was then performed using a B2 MP, 5 [place] JL4, an internal mammary artery catheter and JR4. Left ventricular hemodynamics were assessed after crossing the aortic valve using the JR4 and  a straight 0.035 wire. The pressure gradient was measured by pullback.  Hemodynamic data will be difficult to interpret because of ventricular bigeminy. We attempted to suppress the ectopy with IV lidocaine and atropine without success. The left ventricle was not opacified. Previous documentation of normal LV function by recent echo.  Using real-time vascular ultrasound, the Seldinger needle was used to make several passes of the femoral vein. The vein could not be easily visualized. After several attempts and anterior wall stick was performed. A 7 French sheath was placed using the modified Seldinger technique. A 7 French Swan-Ganz catheter was then advanced into the pulmonary artery with difficulty, requiring a 0.025 guidewire for stiffening. An oximetry sample was obtained simultaneously with an aortic oximetry sample. The case was terminated.  Femoral vein and artery disease were pulled and hemostasis achieved with manual compression.  During this procedure the patient is administered a total of Versed 1 mg and Fentanyl 50 mg to achieve and maintain moderate conscious sedation. The patient's heart rate, blood pressure, and oxygen saturation are monitored continuously during the procedure. The period of conscious sedation is 44 minutes, of which I was present face-to-face 100% of this time.   Estimated blood loss <50 mL.  During this procedure the patient was administered the following to achieve and maintain moderate conscious sedation: Versed 1 mg, Fentanyl 50 mcg, while the patient's heart rate, blood pressure, and oxygen saturation were continuously monitored. The period of conscious sedation was 44 minutes, of which I was present face-to-face 100% of this time.    Coronary Findings   Dominance: Right  Left Main  LM lesion, 50% stenosed.  Left Anterior Descending  Mid LAD to Dist LAD lesion, 50% stenosed.  Dist LAD lesion, 85% stenosed.  First Diagonal Branch  Vessel is small in size.  Ost 1st Diag lesion, 100% stenosed.  Second Diagonal Branch  Vessel is  small in size.  Ost 2nd Diag to 2nd Diag lesion, 70% stenosed.  2nd Diag lesion, 80% stenosed.  Left Circumflex  Vessel is small.  Ost Cx to Prox Cx lesion, 100% stenosed.  First Obtuse Marginal Branch  Vessel is moderate in size.  Right Coronary Artery  Mid RCA lesion, 100% stenosed.  Dist RCA lesion, 100% stenosed.  Right Posterior Atrioventricular Branch  Vessel is small in size.  Post Atrio lesion, 60% stenosed.  Graft Angiography  LIMA Graft to Dist LAD  saphenous Graft to Dist RCA  SVG.  Graft to 1st Mrg  saphenous Graft to 1st Diag  SVG.  Coronary Diagrams   Diagnostic Diagram       Implants        No implant documentation for this case.  PACS Images   Show images for Cardiac catheterization   Link to Procedure Log   Procedure Log    Hemo Data    Most Recent Value  Fick Cardiac Output 4.16 L/min  Fick Cardiac Output Index 2.06 (L/min)/BSA  Aortic Mean Gradient 32.8 mmHg  Aortic Peak Gradient 29 mmHg  Aortic Valve Area 1.27  Aortic Value Area Index 0.63 cm2/BSA  RA A Wave 10 mmHg  RA V Wave 11 mmHg  RA Mean 7 mmHg  RV Systolic Pressure 66 mmHg  RV Diastolic Pressure 1 mmHg  RV EDP 7 mmHg  PA Systolic Pressure 67 mmHg  PA Diastolic Pressure 20 mmHg  PA Mean 40 mmHg  PW A Wave 38 mmHg  PW V Wave 0 mmHg  PW Mean 26 mmHg  AO Systolic  Pressure 283 mmHg  AO Diastolic Pressure 63 mmHg  AO Mean 95 mmHg  LV Systolic Pressure 151 mmHg  LV Diastolic Pressure 6 mmHg  LV EDP 11 mmHg  Arterial Occlusion Pressure Extended Systolic Pressure 761 mmHg  Arterial Occlusion Pressure Extended Diastolic Pressure 60 mmHg  Arterial Occlusion Pressure Extended Mean Pressure 88 mmHg  Left Ventricular Apex Extended Systolic Pressure 607 mmHg  Left Ventricular Apex Extended Diastolic Pressure 10 mmHg  Left Ventricular Apex Extended EDP Pressure 15 mmHg  QP/QS 1  TPVR Index 19.44 HRUI  TSVR Index 43.25 HRUI  PVR SVR Ratio 0.17  TPVR/TSVR Ratio 0.45     CT CORONARY MORPH W/CTA COR W/SCORE W/CA W/CM &/OR WO/CM (Accession [id number]) (Order [id number])  Imaging  Date: 07/11/2017 Department: [facility] CT IMAGING Released By: Garth Bigness D Authorizing: Burnell Blanks, MD  Exam Information   Status Exam Begun  Exam Ended   Final [99] 07/11/2017 10:45 AM 07/11/2017 11:34 AM  PACS Images   Show images for CT CORONARY MORPH W/CTA COR W/SCORE W/CA W/CM &/OR WO/CM  Study Result   CLINICAL DATA: 83 year old male with history of severe aortic stenosis. Preprocedural study prior to potential transcatheter aortic valve replacement (TAVR) procedure.  EXAM: CT ANGIOGRAPHY OF THE HEART, CORONARY ARTERY, STRUCTURE, AND MORPHOLOGY  TECHNIQUE: CT angiography of the heart was performed on a Siemens Force CT scanner using retrospective ECG gating. A scout and ECG-gated noncontrast exam (for calcium scoring) were performed. Appropriate delay was determined by bolus tracking after injection of iodinated contrast, and an ECG-gated cardiac CTA was performed with sub-mm slice collimation. Imaging post processing was performed on an independent work station creating multiplanar and 3-D images, allowing for quantitative analysis of the heart and coronary arteries. Note that this exam targets the heart and the chest was not imaged in its entirety.  CONTRAST: <95 mL of Isovue 370. >  COMPARISON: None.  MEDICATIONS: Lopressor 5 mg, IV  FINDINGS: Technical quality: Excellent.  Heart rate: 55.  CORONARY ARTERIES:  Coronary arteries were not evaluated on today's examination given the severity of calcified coronary artery atherosclerosis, and recent cardiac catheterization.  AORTIC ROOT FINDINGS/MEASUREMENTS PERTINENT TO TAVR:  Aortic Valve Description: Tri-leaflet aortic valve is severely thickened and densely calcified with limited opening of all 3 leaflets during systole. During peak  systole, aortic valve area is estimated at 0.65 cm squared by planimetry.  Annulus:  Planimetry - 5.21 cm2 (calculated mean diameter 25.8 mm)  Long & Short Axis - 28.2 x 24.1 mm (calculated mean diameter 26.2 mm)  Circumference - 82.2 mm (calculated mean diameter 26.2 mm)  Sinuses of Valsalva:  R SOV - width 33.5 mm  height 22.3 mm  L SOV - width 34.0 mm  height 23.4 mm  [place] SOV - width 34.9 mm  height 18.3 mm  Coronary Artery [place]:  L main - 15.5 mm from the annulus  RCA - 13.9 mm from the annulus  OTHER FINDINGS:  Please see separate dictation for contemporaneously obtained CTA of the chest, abdomen and pelvis for full description of relevant extracardiac findings.  IMPRESSION: 1. Severely thickened and densely calcified trileaflet aortic valve with estimated aortic valve area of approximately 0.65 cm square during planimetry, and calculated annular mean diameter ranging from 25.8-26.2 mm. 2. Additional findings and measurements relevant to potential TAVR procedure, as above.   Electronically Signed By: Vinnie Langton M.D. On: 07/11/2017 13:05     CT Angio Abd/Pel w/ and/or w/o (Accession [id number]) (Order  [id number])  Imaging  Date: 07/11/2017 Department: [facility] CT IMAGING Released By: Ermalinda Barrios Authorizing: Burnell Blanks, MD  Exam Information   Status Exam Begun  Exam Ended   Final [99] 07/11/2017 10:45 AM 07/11/2017 11:34 AM  PACS Images   Show images for CT Angio Abd/Pel w/ and/or w/o  Study Result   CLINICAL DATA: 83 year old male with history of severe aortic stenosis. Preprocedural study prior to potential transcatheter aortic valve replacement (TAVR) procedure.  EXAM: CT ANGIOGRAPHY CHEST, ABDOMEN AND PELVIS  TECHNIQUE: Multidetector CT imaging through the chest, abdomen and pelvis was performed using the standard protocol during bolus  administration of intravenous contrast. Multiplanar reconstructed images and MIPs were obtained and reviewed to evaluate the vascular anatomy.  CONTRAST: 95 mL of Isovue 370.  COMPARISON: No priors.  FINDINGS: CTA CHEST FINDINGS  Cardiovascular: Heart size is enlarged. There is no significant pericardial fluid, thickening or pericardial calcification. There is aortic atherosclerosis, as well as atherosclerosis of the great vessels of the mediastinum and the coronary arteries, including calcified atherosclerotic plaque in the left main, left anterior descending, left circumflex and right coronary arteries. Status post median sternotomy for CABG including LIMA to the LAD.  Mediastinum/Lymph Nodes: No pathologically enlarged mediastinal or hilar lymph nodes. Esophagus is unremarkable in appearance. No axillary lymphadenopathy.  Lungs/Pleura: No acute consolidative airspace disease. No pleural effusions. No suspicious appearing pulmonary nodules or masses. Scattered areas of mild scarring noted throughout the lower lobes of the lungs bilaterally.  Musculoskeletal/Soft Tissues: Median sternotomy wires. There are no aggressive appearing lytic or blastic lesions noted in the visualized portions of the skeleton.  CTA ABDOMEN AND PELVIS FINDINGS  Hepatobiliary: No cystic or solid hepatic lesions. No intra or extrahepatic biliary ductal dilatation. Small calcified gallstones lying dependently in the gallbladder. Gallbladder is otherwise normal in appearance.  Pancreas: No pancreatic mass. No pancreatic ductal dilatation. No pancreatic or peripancreatic fluid or inflammatory changes.  Spleen: Unremarkable.  Adrenals/Urinary Tract: 12 mm nonobstructive calculus in the lower pole collecting system of the right kidney. 6 mm nonobstructive calculus in the lower pole collecting system of the left kidney. Extensive cortical thinning in the lower pole of the right  kidney. Several other scattered areas of mild cortical thinning noted throughout the kidneys bilaterally. 1.3 cm exophytic lesion in the upper pole of the right kidney is low-attenuation and does not enhance, compatible with a simple cysts. No aggressive appearing renal lesions are noted. No hydroureteronephrosis. In the left side of the urinary bladder at or adjacent to the left ureterovesicular junction there is a 1.9 x 1.3 x 1.2 cm calculus (axial image 195 of series 15). 1.5 cm diverticulum extending from the lateral aspect of the right side of the urinary bladder.  Stomach/Bowel: The appearance of the stomach is normal. There is no pathologic dilatation of small bowel or colon. Numerous colonic diverticulae are noted, most pronounced in the descending colon and sigmoid colon, without surrounding inflammatory changes to suggest an acute diverticulitis at this time. Normal appendix.  Vascular/Lymphatic: Aortic atherosclerosis with vascular findings and measurements pertinent to potential TAVR procedure, as detailed below. Fusiform aneurysmal dilatation of the infrarenal abdominal aorta which measures up to 3.2 x 3.3 cm. Celiac axis, superior mesenteric artery and inferior mesenteric artery are all widely patent without hemodynamically significant stenosis. Two right renal arteries are noted, the larger and more inferior of which demonstrates mild stenosis at the ostium. Single left renal artery noted with mild proximal stenosis. Short-segment dissections  in the proximal common iliac arteries bilaterally. No lymphadenopathy noted in the abdomen or pelvis.  Reproductive: Prostate gland and seminal vesicles are unremarkable in appearance.  Other: No significant volume of ascites. No pneumoperitoneum.  Musculoskeletal: There are no aggressive appearing lytic or blastic lesions noted in the visualized portions of the skeleton.  VASCULAR MEASUREMENTS PERTINENT TO  TAVR:  Comment: Suboptimal contrast bolus in distal right external iliac artery and right common femoral artery slightly limits assessment.  AORTA:  Minimal Aortic Diameter - 14 x 13 mm  Severity of Aortic Calcification - severe  RIGHT PELVIS:  Right Common Iliac Artery -  Minimal Diameter - 9.4 x 8.0 mm  Tortuosity - moderate  Calcification - moderate  Comment - Ectasia (1.4 cm in diameter) and short segment dissection of the proximal right common iliac artery.  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  Left External Iliac Artery -  Minimal Diameter - 7.3 x 7.1 mm  Tortuosity - severe  Calcification - none  Left Common Femoral Artery -  Minimal Diameter - 7.1 x 8.3 mm  Tortuosity - mild  Calcification - mild  Review of the MIP images confirms the above findings.  IMPRESSION: 1. Vascular findings and measurements relevant to potential TAVR procedure, as detailed above. Based on size, the patient has suitable pelvic arterial access bilaterally. 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 2. Severe thickening and calcification of the aortic valve, compatible with the reported clinical history of severe aortic stenosis. 3. Aortic atherosclerosis, in addition to left main and 3 vessel coronary artery disease. Status post median sternotomy for CABG including LIMA to the LAD. 4. Mild fusiform  aneurysmal dilatation of the infrarenal abdominal aorta which measures up to 3.3 x 3.2 cm. Recommend followup by ultrasound in 3 years. This recommendation follows ACR consensus guidelines: White Paper of the ACR Incidental Findings Committee II on Vascular Findings. J Am Coll Radiol 2013; 10:789-794. 5. Large calculus in the left side of the urinary bladder at or adjacent to the left ureterovesicular junction. There is no associated hydroureteronephrosis to indicate urinary tract obstruction at this time. There also nonobstructive calculi in the lower pole collecting systems of the kidneys bilaterally measuring up to 12 mm in the lower pole the right kidney where there is extensive overlying scarring. 6. Cholelithiasis without evidence of acute cholecystitis at this time. 7. Colonic diverticulosis without evidence to suggest an acute diverticulitis at this time. 8. Additional incidental findings, as above.  Aortic Atherosclerosis (ICD10-I70.0).   Electronically Signed By: Vinnie Langton M.D. On: 07/11/2017 14:21   STS Risk Score Risk of Mortality: 6.635%  Morbidity or Mortality: 26.641%  Long Length of Stay: 11.997%  Short Length of Stay: 20.805%  Permanent Stroke: 2.515%  Prolonged Ventilation: 16.357%  DSW Infection: 0.502%  Renal Failure: 9.862%  Reoperation: 9.73%   Impression:  This 83 year old gentleman has stage D, severe, symptomatic aortic stenosis with progressive exertional fatigue and shortness of  breath, NYHA class II, consistent with chronic diastolic heart failure. I have personally reviewed his echo, cath and CT studies. His echo shows a trileaflet aortic valve with severe leaflet calcification and severely reduced mobility with a mean gradient of 40 mm Hg and an AVA of 0.87 cm2. LV function is normal. His cath shows that all 4 bypass grafts are patent without any significant ischemic territories. I agree that AVR is indicated in this patient but he  would be at high risk for a redo sternotomy and AVR at 83 years old. I think TAVR is the best option for him. His gated cardiac CT shows anatomy suitable for a 26 mm Sapien 3 valve. 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   The patient was counseled at length regarding treatment alternatives for management of severe symptomatic aortic stenosis. The risks and benefits of surgical intervention has been discussed in detail. Long-term prognosis with medical therapy was discussed. Alternative approaches such as conventional surgical aortic valve replacement, transcatheter aortic valve replacement, and palliative medical therapy were compared and contrasted at length. This discussion was placed in the context of the patient's own specific clinical presentation and past medical history. All of his questions have been addressed. The patient is eager to proceed with TAVR.  Following the decision to proceed with transcatheter aortic valve replacement, a discussion was held regarding what types of management strategies would be attempted intraoperatively in the event of life-threatening complications, including whether or not the patient would be considered a candidate for the use of cardiopulmonary bypass and/or conversion to open sternotomy for attempted surgical intervention. The patient is aware of the fact that transient use of cardiopulmonary bypass may be necessary, but the patient specifically states that she would not wish to undergo redo median sternotomy under any circumstances, even if she were to develop potentially lethal complications related to transcatheter valves appointment.   The patient has been advised of a variety of complications that might develop including but not limited to risks of death, stroke, paravalvular leak, aortic dissection or other major vascular  complications, aortic annulus rupture, device embolization, cardiac rupture or perforation, mitral regurgitation, acute myocardial infarction, arrhythmia, heart block or bradycardia requiring permanent pacemaker placement, congestive heart failure, respiratory failure, renal failure, pneumonia, infection, other late complications related to structural valve deterioration or migration, or other complications that might ultimately cause a temporary or permanent loss of functional independence or other long term morbidity. The patient provides full informed consent for the procedure as described and all questions were answered.     Plan:  Transfemoral TAVR    Gaye Pollack, MD

## 2017-07-31 ENCOUNTER — Encounter (HOSPITAL_COMMUNITY): Payer: Self-pay | Admitting: *Deleted

## 2017-07-31 ENCOUNTER — Inpatient Hospital Stay (HOSPITAL_COMMUNITY): Payer: Medicare Other

## 2017-07-31 ENCOUNTER — Encounter (HOSPITAL_COMMUNITY)
Admission: RE | Disposition: A | Discharge status: Home / Self Care | Payer: Self-pay | Source: Home / Self Care | Attending: Surgery

## 2017-07-31 ENCOUNTER — Inpatient Hospital Stay (HOSPITAL_COMMUNITY)
Admission: RE | Admit: 2017-07-31 | Discharge: 2017-08-02 | DRG: 267 | Disposition: A | Discharge status: Home / Self Care | Payer: Medicare Other | Attending: Surgery | Admitting: Surgery

## 2017-07-31 ENCOUNTER — Inpatient Hospital Stay (HOSPITAL_COMMUNITY): Payer: Medicare Other | Admitting: Anesthesiology

## 2017-07-31 ENCOUNTER — Inpatient Hospital Stay (HOSPITAL_COMMUNITY): Payer: Medicare Other | Admitting: Vascular Surgery

## 2017-07-31 DIAGNOSIS — Z880 Allergy status to penicillin: Secondary | ICD-10-CM

## 2017-07-31 DIAGNOSIS — E1122 Type 2 diabetes mellitus with diabetic chronic kidney disease: Secondary | ICD-10-CM | POA: Diagnosis present

## 2017-07-31 DIAGNOSIS — R008 Other abnormalities of heart beat: Secondary | ICD-10-CM | POA: Diagnosis present

## 2017-07-31 DIAGNOSIS — Z882 Allergy status to sulfonamides status: Secondary | ICD-10-CM | POA: Diagnosis not present

## 2017-07-31 DIAGNOSIS — D72829 Elevated white blood cell count, unspecified: Secondary | ICD-10-CM | POA: Diagnosis not present

## 2017-07-31 DIAGNOSIS — Z8249 Family history of ischemic heart disease and other diseases of the circulatory system: Secondary | ICD-10-CM

## 2017-07-31 DIAGNOSIS — E785 Hyperlipidemia, unspecified: Secondary | ICD-10-CM | POA: Diagnosis present

## 2017-07-31 DIAGNOSIS — Z006 Encounter for examination for normal comparison and control in clinical research program: Secondary | ICD-10-CM

## 2017-07-31 DIAGNOSIS — Z953 Presence of xenogenic heart valve: Secondary | ICD-10-CM | POA: Diagnosis not present

## 2017-07-31 DIAGNOSIS — I493 Ventricular premature depolarization: Secondary | ICD-10-CM | POA: Diagnosis present

## 2017-07-31 DIAGNOSIS — F17210 Nicotine dependence, cigarettes, uncomplicated: Secondary | ICD-10-CM | POA: Diagnosis present

## 2017-07-31 DIAGNOSIS — I5032 Chronic diastolic (congestive) heart failure: Secondary | ICD-10-CM | POA: Diagnosis present

## 2017-07-31 DIAGNOSIS — Z954 Presence of other heart-valve replacement: Secondary | ICD-10-CM | POA: Diagnosis not present

## 2017-07-31 DIAGNOSIS — Z951 Presence of aortocoronary bypass graft: Secondary | ICD-10-CM | POA: Diagnosis not present

## 2017-07-31 DIAGNOSIS — I340 Nonrheumatic mitral (valve) insufficiency: Secondary | ICD-10-CM | POA: Diagnosis not present

## 2017-07-31 DIAGNOSIS — I471 Supraventricular tachycardia: Secondary | ICD-10-CM | POA: Diagnosis present

## 2017-07-31 DIAGNOSIS — I2510 Atherosclerotic heart disease of native coronary artery without angina pectoris: Secondary | ICD-10-CM | POA: Diagnosis present

## 2017-07-31 DIAGNOSIS — I714 Abdominal aortic aneurysm, without rupture: Secondary | ICD-10-CM | POA: Diagnosis present

## 2017-07-31 DIAGNOSIS — I2582 Chronic total occlusion of coronary artery: Secondary | ICD-10-CM | POA: Diagnosis present

## 2017-07-31 DIAGNOSIS — Z7984 Long term (current) use of oral hypoglycemic drugs: Secondary | ICD-10-CM | POA: Diagnosis not present

## 2017-07-31 DIAGNOSIS — R0602 Shortness of breath: Secondary | ICD-10-CM | POA: Diagnosis present

## 2017-07-31 DIAGNOSIS — N182 Chronic kidney disease, stage 2 (mild): Secondary | ICD-10-CM | POA: Diagnosis present

## 2017-07-31 DIAGNOSIS — J449 Chronic obstructive pulmonary disease, unspecified: Secondary | ICD-10-CM | POA: Diagnosis present

## 2017-07-31 DIAGNOSIS — I130 Hypertensive heart and chronic kidney disease with heart failure and stage 1 through stage 4 chronic kidney disease, or unspecified chronic kidney disease: Secondary | ICD-10-CM | POA: Diagnosis present

## 2017-07-31 DIAGNOSIS — Z79899 Other long term (current) drug therapy: Secondary | ICD-10-CM

## 2017-07-31 DIAGNOSIS — Z9861 Coronary angioplasty status: Secondary | ICD-10-CM | POA: Diagnosis not present

## 2017-07-31 DIAGNOSIS — I350 Nonrheumatic aortic (valve) stenosis: Secondary | ICD-10-CM | POA: Diagnosis present

## 2017-07-31 DIAGNOSIS — I252 Old myocardial infarction: Secondary | ICD-10-CM | POA: Diagnosis not present

## 2017-07-31 DIAGNOSIS — Z7982 Long term (current) use of aspirin: Secondary | ICD-10-CM

## 2017-07-31 HISTORY — PX: TEE WITHOUT CARDIOVERSION: SHX5443

## 2017-07-31 HISTORY — PX: TRANSCATHETER AORTIC VALVE REPLACEMENT, TRANSFEMORAL: SHX6400

## 2017-07-31 LAB — POCT I-STAT 3, ART BLOOD GAS (G3+)
ACID-BASE DEFICIT: 5 mmol/L — AB (ref 0.0–2.0)
Acid-base deficit: 1 mmol/L (ref 0.0–2.0)
Acid-base deficit: 1 mmol/L (ref 0.0–2.0)
BICARBONATE: 23.4 mmol/L (ref 20.0–28.0)
Bicarbonate: 20.2 mmol/L (ref 20.0–28.0)
Bicarbonate: 26 mmol/L (ref 20.0–28.0)
O2 SAT: 98 %
O2 Saturation: 99 %
O2 Saturation: 94 %
PCO2 ART: 38.6 mmHg (ref 32.0–48.0)
PH ART: 7.392 (ref 7.350–7.450)
PH ART: 7.323 — AB (ref 7.350–7.450)
PH ART: 7.323 — AB (ref 7.350–7.450)
PO2 ART: 137 mmHg — AB (ref 83.0–108.0)
TCO2: 25 mmol/L (ref 0–100)
TCO2: 21 mmol/L (ref 0–100)
TCO2: 27 mmol/L (ref 0–100)
pCO2 arterial: 38.9 mmHg (ref 32.0–48.0)
pCO2 arterial: 49.9 mmHg — ABNORMAL HIGH (ref 32.0–48.0)
pO2, Arterial: 106 mmHg (ref 83.0–108.0)
pO2, Arterial: 74 mmHg — ABNORMAL LOW (ref 83.0–108.0)

## 2017-07-31 LAB — POCT I-STAT, CHEM 8
BUN: 15 mg/dL (ref 6–20)
BUN: 12 mg/dL (ref 6–20)
BUN: 14 mg/dL (ref 6–20)
CHLORIDE: 105 mmol/L (ref 101–111)
CREATININE: 0.7 mg/dL (ref 0.61–1.24)
CREATININE: 0.9 mg/dL (ref 0.61–1.24)
Calcium, Ion: 1.23 mmol/L (ref 1.15–1.40)
Calcium, Ion: 1.13 mmol/L — ABNORMAL LOW (ref 1.15–1.40)
Calcium, Ion: 1.22 mmol/L (ref 1.15–1.40)
Chloride: 107 mmol/L (ref 101–111)
Chloride: 110 mmol/L (ref 101–111)
Creatinine, Ser: 0.9 mg/dL (ref 0.61–1.24)
GLUCOSE: 114 mg/dL — AB (ref 65–99)
Glucose, Bld: 102 mg/dL — ABNORMAL HIGH (ref 65–99)
Glucose, Bld: 113 mg/dL — ABNORMAL HIGH (ref 65–99)
HCT: 26 % — ABNORMAL LOW (ref 39.0–52.0)
HEMATOCRIT: 33 % — AB (ref 39.0–52.0)
HEMATOCRIT: 31 % — AB (ref 39.0–52.0)
HEMOGLOBIN: 11.2 g/dL — AB (ref 13.0–17.0)
HEMOGLOBIN: 10.5 g/dL — AB (ref 13.0–17.0)
Hemoglobin: 8.8 g/dL — ABNORMAL LOW (ref 13.0–17.0)
POTASSIUM: 4.3 mmol/L (ref 3.5–5.1)
POTASSIUM: 3.9 mmol/L (ref 3.5–5.1)
POTASSIUM: 4.5 mmol/L (ref 3.5–5.1)
Sodium: 142 mmol/L (ref 135–145)
Sodium: 143 mmol/L (ref 135–145)
Sodium: 144 mmol/L (ref 135–145)
TCO2: 24 mmol/L (ref 0–100)
TCO2: 21 mmol/L (ref 0–100)
TCO2: 25 mmol/L (ref 0–100)

## 2017-07-31 LAB — CBC
HCT: 34 % — ABNORMAL LOW (ref 39.0–52.0)
HEMOGLOBIN: 11.2 g/dL — AB (ref 13.0–17.0)
MCH: 29.2 pg (ref 26.0–34.0)
MCHC: 32.9 g/dL (ref 30.0–36.0)
MCV: 88.5 fL (ref 78.0–100.0)
PLATELETS: 116 10*3/uL — AB (ref 150–400)
RBC: 3.84 MIL/uL — ABNORMAL LOW (ref 4.22–5.81)
RDW: 13.7 % (ref 11.5–15.5)
WBC: 8.4 10*3/uL (ref 4.0–10.5)

## 2017-07-31 LAB — ECHOCARDIOGRAM COMPLETE
Height: 74 in
WEIGHTICAEL: 2656 [oz_av]

## 2017-07-31 LAB — POCT I-STAT 4, (NA,K, GLUC, HGB,HCT)
Glucose, Bld: 127 mg/dL — ABNORMAL HIGH (ref 65–99)
HCT: 32 % — ABNORMAL LOW (ref 39.0–52.0)
HEMOGLOBIN: 10.9 g/dL — AB (ref 13.0–17.0)
POTASSIUM: 4.5 mmol/L (ref 3.5–5.1)
SODIUM: 141 mmol/L (ref 135–145)

## 2017-07-31 LAB — GLUCOSE, CAPILLARY
GLUCOSE-CAPILLARY: 125 mg/dL — AB (ref 65–99)
GLUCOSE-CAPILLARY: 161 mg/dL — AB (ref 65–99)
Glucose-Capillary: 80 mg/dL (ref 65–99)
Glucose-Capillary: 178 mg/dL — ABNORMAL HIGH (ref 65–99)

## 2017-07-31 LAB — ECHOCARDIOGRAM LIMITED
Height: 74 in
Weight: 2656 oz

## 2017-07-31 SURGERY — IMPLANTATION, AORTIC VALVE, TRANSCATHETER, FEMORAL APPROACH
Anesthesia: Monitor Anesthesia Care

## 2017-07-31 MED ORDER — PANTOPRAZOLE SODIUM 40 MG PO TBEC
40.0000 mg | DELAYED_RELEASE_TABLET | Freq: Every day | ORAL | Status: DC
  Administered 2017-08-01: 40 mg via ORAL
  Filled 2017-07-31: qty 1

## 2017-07-31 MED ORDER — ACETAMINOPHEN 500 MG PO TABS
1000.0000 mg | ORAL_TABLET | Freq: Four times a day (QID) | ORAL | Status: DC
  Administered 2017-07-31 – 2017-08-01 (×3): 1000 mg via ORAL
  Filled 2017-07-31 (×4): qty 2

## 2017-07-31 MED ORDER — CHLORHEXIDINE GLUCONATE CLOTH 2 % EX PADS
6.0000 | MEDICATED_PAD | Freq: Every day | CUTANEOUS | Status: DC
  Administered 2017-08-01: 6 via TOPICAL

## 2017-07-31 MED ORDER — FENTANYL CITRATE (PF) 250 MCG/5ML IJ SOLN
INTRAMUSCULAR | Status: DC | PRN
  Administered 2017-07-31: 25 ug via INTRAVENOUS

## 2017-07-31 MED ORDER — SODIUM CHLORIDE 0.9% FLUSH
3.0000 mL | Freq: Two times a day (BID) | INTRAVENOUS | Status: DC
  Administered 2017-08-01: 3 mL via INTRAVENOUS

## 2017-07-31 MED ORDER — LACTATED RINGERS IV SOLN
INTRAVENOUS | Status: DC | PRN
  Administered 2017-07-31: 10:00:00 via INTRAVENOUS

## 2017-07-31 MED ORDER — CHLORHEXIDINE GLUCONATE 4 % EX LIQD: 60.0000 mL | Freq: Once | CUTANEOUS | Status: DC

## 2017-07-31 MED ORDER — MORPHINE SULFATE (PF) 2 MG/ML IV SOLN: 2.0000 mg | INTRAVENOUS | Status: DC | PRN

## 2017-07-31 MED ORDER — VANCOMYCIN HCL IN DEXTROSE 1-5 GM/200ML-% IV SOLN
1000.0000 mg | Freq: Once | INTRAVENOUS | Status: AC
  Administered 2017-07-31: 1000 mg via INTRAVENOUS
  Filled 2017-07-31: qty 200

## 2017-07-31 MED ORDER — SODIUM CHLORIDE 0.9% FLUSH: 3.0000 mL | INTRAVENOUS | Status: DC | PRN

## 2017-07-31 MED ORDER — ATORVASTATIN CALCIUM 80 MG PO TABS
80.0000 mg | ORAL_TABLET | Freq: Every day | ORAL | Status: DC
  Administered 2017-07-31 – 2017-08-02 (×3): 80 mg via ORAL
  Filled 2017-07-31 (×3): qty 1

## 2017-07-31 MED ORDER — HEPARIN SODIUM (PORCINE) 1000 UNIT/ML IJ SOLN
INTRAMUSCULAR | Status: DC | PRN
  Administered 2017-07-31: 9000 [IU] via INTRAVENOUS

## 2017-07-31 MED ORDER — MIDAZOLAM HCL 2 MG/2ML IJ SOLN
INTRAMUSCULAR | Status: AC
  Administered 2017-07-31: 0.5 mg via INTRAVENOUS
  Filled 2017-07-31: qty 2

## 2017-07-31 MED ORDER — SODIUM CHLORIDE 0.9% FLUSH
10.0000 mL | INTRAVENOUS | Status: DC | PRN
Start: 2017-07-31 — End: 2017-08-01

## 2017-07-31 MED ORDER — ONDANSETRON HCL 4 MG/2ML IJ SOLN
INTRAMUSCULAR | Status: AC
  Filled 2017-07-31: qty 4

## 2017-07-31 MED ORDER — ONDANSETRON HCL 4 MG/2ML IJ SOLN
INTRAMUSCULAR | Status: DC | PRN
  Administered 2017-07-31: 4 mg via INTRAVENOUS

## 2017-07-31 MED ORDER — CHLORHEXIDINE GLUCONATE 0.12 % MT SOLN
15.0000 mL | OROMUCOSAL | Status: AC
  Administered 2017-07-31: 15 mL via OROMUCOSAL

## 2017-07-31 MED ORDER — ASPIRIN 81 MG PO CHEW: 324.0000 mg | CHEWABLE_TABLET | Freq: Every day | ORAL | Status: DC

## 2017-07-31 MED ORDER — ACETAMINOPHEN 650 MG RE SUPP: 650.0000 mg | Freq: Once | RECTAL | Status: DC

## 2017-07-31 MED ORDER — SODIUM CHLORIDE 0.9 % IV SOLN
0.0000 ug/min | INTRAVENOUS | Status: DC
  Administered 2017-07-31: 20 ug/min via INTRAVENOUS
  Filled 2017-07-31: qty 2

## 2017-07-31 MED ORDER — ACETAMINOPHEN 160 MG/5ML PO SOLN: 1000.0000 mg | Freq: Four times a day (QID) | ORAL | Status: DC

## 2017-07-31 MED ORDER — SODIUM CHLORIDE 0.9 % IJ SOLN
INTRAMUSCULAR | Status: AC
  Filled 2017-07-31: qty 20

## 2017-07-31 MED ORDER — ACETAMINOPHEN 160 MG/5ML PO SOLN: 650.0000 mg | Freq: Once | ORAL | Status: DC

## 2017-07-31 MED ORDER — DEXMEDETOMIDINE HCL IN NACL 400 MCG/100ML IV SOLN
INTRAVENOUS | Status: DC | PRN
  Administered 2017-07-31: 1 ug/kg/h via INTRAVENOUS

## 2017-07-31 MED ORDER — ETOMIDATE 2 MG/ML IV SOLN
INTRAVENOUS | Status: AC
  Filled 2017-07-31: qty 10

## 2017-07-31 MED ORDER — OXYCODONE HCL 5 MG PO TABS: 5.0000 mg | ORAL_TABLET | ORAL | Status: DC | PRN

## 2017-07-31 MED ORDER — MEMANTINE HCL 10 MG PO TABS
10.0000 mg | ORAL_TABLET | Freq: Two times a day (BID) | ORAL | Status: DC
  Administered 2017-07-31 – 2017-08-02 (×4): 10 mg via ORAL
  Filled 2017-07-31 (×4): qty 1

## 2017-07-31 MED ORDER — SODIUM CHLORIDE 0.9 % IV SOLN
250.0000 mL | INTRAVENOUS | Status: DC
  Administered 2017-08-01: 250 mL via INTRAVENOUS

## 2017-07-31 MED ORDER — PROTAMINE SULFATE 10 MG/ML IV SOLN
INTRAVENOUS | Status: AC
  Filled 2017-07-31: qty 20

## 2017-07-31 MED ORDER — DONEPEZIL HCL 5 MG PO TABS
5.0000 mg | ORAL_TABLET | Freq: Every day | ORAL | Status: DC
  Administered 2017-07-31 – 2017-08-01 (×2): 5 mg via ORAL
  Filled 2017-07-31 (×2): qty 1

## 2017-07-31 MED ORDER — NITROGLYCERIN IN D5W 200-5 MCG/ML-% IV SOLN: 0.0000 ug/min | INTRAVENOUS | Status: DC

## 2017-07-31 MED ORDER — DEXAMETHASONE SODIUM PHOSPHATE 10 MG/ML IJ SOLN
INTRAMUSCULAR | Status: DC | PRN
  Administered 2017-07-31: 10 mg via INTRAVENOUS

## 2017-07-31 MED ORDER — MIDAZOLAM HCL 2 MG/2ML IJ SOLN
0.5000 mg | Freq: Once | INTRAMUSCULAR | Status: AC
  Administered 2017-07-31: 0.5 mg via INTRAVENOUS

## 2017-07-31 MED ORDER — PHENYLEPHRINE 40 MCG/ML (10ML) SYRINGE FOR IV PUSH (FOR BLOOD PRESSURE SUPPORT)
PREFILLED_SYRINGE | INTRAVENOUS | Status: DC | PRN
  Administered 2017-07-31: 120 ug via INTRAVENOUS
  Administered 2017-07-31: 80 ug via INTRAVENOUS

## 2017-07-31 MED ORDER — INSULIN ASPART 100 UNIT/ML ~~LOC~~ SOLN
0.0000 [IU] | SUBCUTANEOUS | Status: DC
  Administered 2017-07-31: 2 [IU] via SUBCUTANEOUS
  Administered 2017-07-31: 4 [IU] via SUBCUTANEOUS
  Administered 2017-08-01 (×3): 2 [IU] via SUBCUTANEOUS
  Administered 2017-08-01: 4 [IU] via SUBCUTANEOUS

## 2017-07-31 MED ORDER — LIDOCAINE HCL (PF) 1 % IJ SOLN
INTRAMUSCULAR | Status: DC | PRN
  Administered 2017-07-31: 12 mL

## 2017-07-31 MED ORDER — SODIUM CHLORIDE 0.45 % IV SOLN: INTRAVENOUS | Status: DC | PRN

## 2017-07-31 MED ORDER — FENTANYL CITRATE (PF) 100 MCG/2ML IJ SOLN
50.0000 ug | Freq: Once | INTRAMUSCULAR | Status: AC
  Administered 2017-07-31: 50 ug via INTRAVENOUS

## 2017-07-31 MED ORDER — 0.9 % SODIUM CHLORIDE (POUR BTL) OPTIME
TOPICAL | Status: DC | PRN
  Administered 2017-07-31: 5000 mL

## 2017-07-31 MED ORDER — ALBUMIN HUMAN 5 % IV SOLN: 250.0000 mL | INTRAVENOUS | Status: DC | PRN

## 2017-07-31 MED ORDER — ONDANSETRON HCL 4 MG/2ML IJ SOLN: 4.0000 mg | Freq: Four times a day (QID) | INTRAMUSCULAR | Status: DC | PRN

## 2017-07-31 MED ORDER — LIDOCAINE HCL (PF) 1 % IJ SOLN
INTRAMUSCULAR | Status: AC
  Filled 2017-07-31: qty 30

## 2017-07-31 MED ORDER — LACTATED RINGERS IV SOLN
INTRAVENOUS | Status: DC
  Administered 2017-07-31: 09:00:00 via INTRAVENOUS

## 2017-07-31 MED ORDER — LEVOFLOXACIN IN D5W 750 MG/150ML IV SOLN
750.0000 mg | INTRAVENOUS | Status: AC
Start: 2017-08-01 — End: 2017-08-01
  Administered 2017-08-01: 750 mg via INTRAVENOUS
  Filled 2017-07-31: qty 150

## 2017-07-31 MED ORDER — HEPARIN SODIUM (PORCINE) 1000 UNIT/ML IJ SOLN
INTRAMUSCULAR | Status: AC
  Filled 2017-07-31: qty 2

## 2017-07-31 MED ORDER — DEXAMETHASONE SODIUM PHOSPHATE 10 MG/ML IJ SOLN
INTRAMUSCULAR | Status: AC
  Filled 2017-07-31: qty 2

## 2017-07-31 MED ORDER — LACTATED RINGERS IV SOLN: 500.0000 mL | Freq: Once | INTRAVENOUS | Status: DC | PRN

## 2017-07-31 MED ORDER — TRAMADOL HCL 50 MG PO TABS: 50.0000 mg | ORAL_TABLET | ORAL | Status: DC | PRN

## 2017-07-31 MED ORDER — SODIUM CHLORIDE 0.9 % IV SOLN
INTRAVENOUS | Status: AC
  Administered 2017-07-31: 17:00:00 via INTRAVENOUS

## 2017-07-31 MED ORDER — SODIUM CHLORIDE 0.9 % IV SOLN
INTRAVENOUS | Status: DC | PRN
  Administered 2017-07-31: 1500 mL

## 2017-07-31 MED ORDER — PROTAMINE SULFATE 10 MG/ML IV SOLN
INTRAVENOUS | Status: DC | PRN
  Administered 2017-07-31: 90 mg via INTRAVENOUS

## 2017-07-31 MED ORDER — FAMOTIDINE IN NACL 20-0.9 MG/50ML-% IV SOLN
20.0000 mg | Freq: Two times a day (BID) | INTRAVENOUS | Status: AC
  Administered 2017-07-31 (×2): 20 mg via INTRAVENOUS
  Filled 2017-07-31 (×2): qty 50

## 2017-07-31 MED ORDER — IODIXANOL 320 MG/ML IV SOLN
INTRAVENOUS | Status: DC | PRN
  Administered 2017-07-31: 54 mL via INTRA_ARTERIAL

## 2017-07-31 MED ORDER — NOREPINEPHRINE BITARTRATE 1 MG/ML IV SOLN
INTRAVENOUS | Status: DC | PRN
  Administered 2017-07-31: 2 ug/min via INTRAVENOUS

## 2017-07-31 MED ORDER — MORPHINE SULFATE (PF) 4 MG/ML IV SOLN: 2.0000 mg | INTRAVENOUS | Status: DC | PRN

## 2017-07-31 MED ORDER — ASPIRIN EC 325 MG PO TBEC
325.0000 mg | DELAYED_RELEASE_TABLET | Freq: Every day | ORAL | Status: DC
  Administered 2017-08-01: 325 mg via ORAL
  Filled 2017-07-31: qty 1

## 2017-07-31 MED ORDER — FENTANYL CITRATE (PF) 100 MCG/2ML IJ SOLN
INTRAMUSCULAR | Status: AC
  Administered 2017-07-31: 50 ug via INTRAVENOUS
  Filled 2017-07-31: qty 2

## 2017-07-31 MED ORDER — PHENYLEPHRINE 40 MCG/ML (10ML) SYRINGE FOR IV PUSH (FOR BLOOD PRESSURE SUPPORT)
PREFILLED_SYRINGE | INTRAVENOUS | Status: AC
  Filled 2017-07-31: qty 10

## 2017-07-31 MED ORDER — NITROGLYCERIN 0.2 MG/ML ON CALL CATH LAB
INTRAVENOUS | Status: DC | PRN
  Administered 2017-07-31 (×2): 20 ug via INTRAVENOUS

## 2017-07-31 MED ORDER — NIACIN ER 250 MG PO CPCR
1000.0000 mg | ORAL_CAPSULE | Freq: Every day | ORAL | Status: DC
  Administered 2017-07-31 – 2017-08-01 (×2): 1000 mg via ORAL
  Filled 2017-07-31 (×2): qty 4

## 2017-07-31 MED ORDER — FENTANYL CITRATE (PF) 250 MCG/5ML IJ SOLN
INTRAMUSCULAR | Status: AC
  Filled 2017-07-31: qty 5

## 2017-07-31 MED ORDER — FENTANYL CITRATE (PF) 100 MCG/2ML IJ SOLN: 25.0000 ug | INTRAMUSCULAR | Status: DC | PRN

## 2017-07-31 MED FILL — Heparin Sodium (Porcine) Inj 1000 Unit/ML: INTRAMUSCULAR | Qty: 30 | Status: AC

## 2017-07-31 MED FILL — Potassium Chloride Inj 2 mEq/ML: INTRAVENOUS | Qty: 40 | Status: AC

## 2017-07-31 MED FILL — Magnesium Sulfate Inj 50%: INTRAMUSCULAR | Qty: 10 | Status: AC

## 2017-07-31 SURGICAL SUPPLY — 101 items
ADAPTER UNIV SWAN GANZ BIP (ADAPTER) ×1 IMPLANT
ADAPTER UNV SWAN GANZ BIP (ADAPTER) ×2
ADH SKN CLS APL DERMABOND .7 (GAUZE/BANDAGES/DRESSINGS) ×1
ADPR CATH UNV NS SG CATH (ADAPTER) ×1
BAG BANDED W/RUBBER/TAPE 36X54 (MISCELLANEOUS) ×3 IMPLANT
BAG DECANTER FOR FLEXI CONT (MISCELLANEOUS) IMPLANT
BAG EQP BAND 135X91 W/RBR TAPE (MISCELLANEOUS) ×1
BAG SNAP BAND KOVER 36X36 (MISCELLANEOUS) ×6 IMPLANT
BLADE CLIPPER SURG (BLADE) IMPLANT
BLADE OSCILLATING /SAGITTAL (BLADE) IMPLANT
BLADE STERNUM SYSTEM 6 (BLADE) ×3 IMPLANT
CABLE ADAPT CONN TEMP 6FT (ADAPTER) ×3 IMPLANT
CABLE PACING FASLOC BIEGE (MISCELLANEOUS) ×3 IMPLANT
CABLE PACING FASLOC BLUE (MISCELLANEOUS) ×3 IMPLANT
CANNULA FEM VENOUS REMOTE 22FR (CANNULA) IMPLANT
CANNULA OPTISITE PERFUSION 16F (CANNULA) IMPLANT
CANNULA OPTISITE PERFUSION 18F (CANNULA) IMPLANT
CATH DIAG EXPO 6F VENT PIG 145 (CATHETERS) ×6 IMPLANT
CATH EXPO 5FR AL1 (CATHETERS) ×3 IMPLANT
CATH S G BIP PACING (SET/KITS/TRAYS/PACK) ×6 IMPLANT
CATH VISTA GUIDE 6FR JR4 (CATHETERS) ×2 IMPLANT
CLIP VESOCCLUDE MED 24/CT (CLIP) ×3 IMPLANT
CLIP VESOCCLUDE SM WIDE 24/CT (CLIP) ×3 IMPLANT
CONT SPEC 4OZ CLIKSEAL STRL BL (MISCELLANEOUS) ×6 IMPLANT
COVER BACK TABLE 24X17X13 BIG (DRAPES) ×3 IMPLANT
COVER BACK TABLE 60X90IN (DRAPES) ×3 IMPLANT
COVER BACK TABLE 80X110 HD (DRAPES) ×3 IMPLANT
COVER DOME SNAP 22 D (MISCELLANEOUS) ×3 IMPLANT
COVER MAYO STAND STRL (DRAPES) ×3 IMPLANT
CRADLE DONUT ADULT HEAD (MISCELLANEOUS) ×3 IMPLANT
DERMABOND ADVANCED (GAUZE/BANDAGES/DRESSINGS) ×2
DERMABOND ADVANCED .7 DNX12 (GAUZE/BANDAGES/DRESSINGS) ×1 IMPLANT
DEVICE CLOSURE PERCLS PRGLD 6F (VASCULAR PRODUCTS) ×2 IMPLANT
DRAPE INCISE IOBAN 66X45 STRL (DRAPES) IMPLANT
DRAPE SLUSH MACHINE 52X66 (DRAPES) ×3 IMPLANT
DRSG TEGADERM 4X4.75 (GAUZE/BANDAGES/DRESSINGS) ×5 IMPLANT
ELECT REM PT RETURN 9FT ADLT (ELECTROSURGICAL) ×6
ELECTRODE REM PT RTRN 9FT ADLT (ELECTROSURGICAL) ×2 IMPLANT
FELT TEFLON 6X6 (MISCELLANEOUS) ×3 IMPLANT
FEMORAL VENOUS CANN RAP (CANNULA) IMPLANT
GAUZE SPONGE 4X4 12PLY STRL (GAUZE/BANDAGES/DRESSINGS) ×3 IMPLANT
GAUZE SPONGE 4X4 12PLY STRL LF (GAUZE/BANDAGES/DRESSINGS) ×4 IMPLANT
GLOVE BIO SURGEON STRL SZ8 (GLOVE) ×6 IMPLANT
GLOVE EUDERMIC 7 POWDERFREE (GLOVE) ×6 IMPLANT
GLOVE ORTHO TXT STRL SZ7.5 (GLOVE) ×6 IMPLANT
GOWN STRL REUS W/ TWL LRG LVL3 (GOWN DISPOSABLE) ×3 IMPLANT
GOWN STRL REUS W/ TWL XL LVL3 (GOWN DISPOSABLE) ×6 IMPLANT
GOWN STRL REUS W/TWL LRG LVL3 (GOWN DISPOSABLE) ×9
GOWN STRL REUS W/TWL XL LVL3 (GOWN DISPOSABLE) ×18
GUIDEWIRE SAF TJ AMPL .035X180 (WIRE) ×3 IMPLANT
GUIDEWIRE SAFE TJ AMPLATZ EXST (WIRE) ×3 IMPLANT
GUIDEWIRE STRAIGHT .035 260CM (WIRE) ×3 IMPLANT
GUIDEWIRE WHOLEY HI TOR 145CM (WIRE) ×2 IMPLANT
INSERT FOGARTY 61MM (MISCELLANEOUS) ×3 IMPLANT
INSERT FOGARTY SM (MISCELLANEOUS) IMPLANT
INSERT FOGARTY XLG (MISCELLANEOUS) IMPLANT
KIT BASIN OR (CUSTOM PROCEDURE TRAY) ×3 IMPLANT
KIT DILATOR VASC 18G NDL (KITS) IMPLANT
KIT HEART LEFT (KITS) ×3 IMPLANT
KIT ROOM TURNOVER OR (KITS) ×3 IMPLANT
KIT SUCTION CATH 14FR (SUCTIONS) ×6 IMPLANT
NDL PERC 18GX7CM (NEEDLE) ×1 IMPLANT
NEEDLE PERC 18GX7CM (NEEDLE) ×3 IMPLANT
NS IRRIG 1000ML POUR BTL (IV SOLUTION) ×9 IMPLANT
PACK AORTA (CUSTOM PROCEDURE TRAY) ×3 IMPLANT
PAD ARMBOARD 7.5X6 YLW CONV (MISCELLANEOUS) ×6 IMPLANT
PAD ELECT DEFIB RADIOL ZOLL (MISCELLANEOUS) ×3 IMPLANT
PERCLOSE PROGLIDE 6F (VASCULAR PRODUCTS) ×6
SET MICROPUNCTURE 5F STIFF (MISCELLANEOUS) ×3 IMPLANT
SHEATH AVANTI 11CM 8FR (MISCELLANEOUS) ×3 IMPLANT
SHEATH PINNACLE 6F 10CM (SHEATH) ×6 IMPLANT
SHIELD RADPAD SCOOP 12X17 (MISCELLANEOUS) ×4 IMPLANT
SLEEVE REPOSITIONING LENGTH 30 (MISCELLANEOUS) ×3 IMPLANT
SPONGE LAP 4X18 X RAY DECT (DISPOSABLE) ×3 IMPLANT
STOPCOCK MORSE 400PSI 3WAY (MISCELLANEOUS) ×18 IMPLANT
SUT ETHIBOND X763 2 0 SH 1 (SUTURE) IMPLANT
SUT GORETEX CV 4 TH 22 36 (SUTURE) IMPLANT
SUT GORETEX CV4 TH-18 (SUTURE) IMPLANT
SUT GORETEX TH-18 36 INCH (SUTURE) IMPLANT
SUT PROLENE 3 0 SH1 36 (SUTURE) IMPLANT
SUT PROLENE 4 0 RB 1 (SUTURE)
SUT PROLENE 4-0 RB1 .5 CRCL 36 (SUTURE) IMPLANT
SUT PROLENE 5 0 C 1 36 (SUTURE) IMPLANT
SUT PROLENE 6 0 C 1 30 (SUTURE) IMPLANT
SUT SILK  1 MH (SUTURE) ×2
SUT SILK 1 MH (SUTURE) ×1 IMPLANT
SUT SILK 2 0 SH CR/8 (SUTURE) IMPLANT
SUT VIC AB 2-0 CT1 27 (SUTURE)
SUT VIC AB 2-0 CT1 TAPERPNT 27 (SUTURE) IMPLANT
SUT VIC AB 2-0 CTX 36 (SUTURE) IMPLANT
SUT VIC AB 3-0 SH 8-18 (SUTURE) IMPLANT
SYR 10ML LL (SYRINGE) ×9 IMPLANT
SYR 30ML LL (SYRINGE) ×6 IMPLANT
SYR 50ML LL SCALE MARK (SYRINGE) ×3 IMPLANT
TOWEL OR 17X26 10 PK STRL BLUE (TOWEL DISPOSABLE) ×6 IMPLANT
TRANSDUCER W/STOPCOCK (MISCELLANEOUS) ×6 IMPLANT
TRAY FOLEY SILVER 16FR TEMP (SET/KITS/TRAYS/PACK) ×3 IMPLANT
TUBING HIGH PRESSURE 120CM (CONNECTOR) ×3 IMPLANT
VALVE HEART TRANSCATH SZ3 26MM (Prosthesis & Implant Heart) ×2 IMPLANT
WIRE AMPLATZ SS-J .035X180CM (WIRE) ×3 IMPLANT
WIRE BENTSON .035X145CM (WIRE) ×3 IMPLANT

## 2017-07-31 NOTE — Progress Notes (Signed)
  Echocardiogram 2D Echocardiogram has been performed.  Arvil ChacoFoster, Donnald Tabar 07/31/2017, 2:28 PM

## 2017-07-31 NOTE — Anesthesia Procedure Notes (Signed)
Arterial Line Insertion Start/End8/14/2018 9:54 AM, 07/31/2017 10:00 AM Performed by: Rogelia BogaMUELLER, Dez Stauffer P, CRNA  Preanesthetic checklist: patient identified, IV checked, risks and benefits discussed, monitors and equipment checked, pre-op evaluation and timeout performed Lidocaine 1% used for infiltration Left, radial was placed Catheter size: 20 G Hand hygiene performed  and maximum sterile barriers used   Attempts: 1 Procedure performed without using ultrasound guided technique. Following insertion, dressing applied and Biopatch. Post procedure assessment: normal and unchanged  Patient tolerated the procedure well with no immediate complications.

## 2017-07-31 NOTE — Transfer of Care (Signed)
Immediate Anesthesia Transfer of Care Note  Patient: Cory Bradley  Procedure(s) Performed: Procedure(s): TRANSCATHETER AORTIC VALVE REPLACEMENT, TRANSFEMORAL (N/A) TRANSESOPHAGEAL ECHOCARDIOGRAM (TEE) (N/A)  Patient Location: ICU  Anesthesia Type:MAC  Level of Consciousness: awake, alert , oriented and patient cooperative  Airway & Oxygen Therapy: Patient Spontanous Breathing and Patient connected to face mask oxygen  Post-op Assessment: Report given to RN and Post -op Vital signs reviewed and stable  Post vital signs: Reviewed and stable  Last Vitals:  Vitals:   07/31/17 1210 07/31/17 1215  BP:    Pulse: (!) 19 (!) 170  Resp:    Temp:    SpO2:      Last Pain:  Vitals:   07/31/17 0907  TempSrc: Oral      Patients Stated Pain Goal: 4 (32/20/25 4270)  Complications: No apparent anesthesia complications

## 2017-07-31 NOTE — CV Procedure (Signed)
[facility]  TAVR OPERATIVE NOTE   Date of Procedure:  07/31/2017  Preoperative Diagnosis: Severe Aortic Stenosis   Postoperative Diagnosis: Same   Procedure:    Transcatheter Aortic Valve Replacement - Transfemoral Approach  Edwards Sapien 3 THV (size 26 mm, model # [id number], serial # [id number])   Co-Surgeons:  Gaye Pollack, MD and Lauree Chandler, MD  Anesthesiologist:  Ola Spurr  Echocardiographer:  Johnsie Cancel  Pre-operative Echo Findings:  Severe aortic stenosis  Normal left ventricular systolic function  Post-operative Echo Findings:  No paravalvular leak  Normal left ventricular systolic function  BRIEF CLINICAL NOTE AND INDICATIONS FOR SURGERY  83 yo male with history of CAD s/p CABG, SVT, carotid artery disease, DM, HTN, HLD, chronic kidney disease, carotid artery disease, mild COPD, PVCs and severe aortic stenosis who is here today for TAVR. He had an inferior STEMI in 2005 and following balloon angioplasty of the RCA underwent 4V CABG. He has had worsened dyspnea with exertion over the last few months. Workup in pulmonary clinic and not felt to have significant lung disease. Echo 05/25/17 with normal LV systolic function, moderately severe aortic stenosis. Cardiac cath 05/28/17 with severe native vessel CAD, 4/4 patent bypass grafts. CT scans demonstrate favorable anatomy for femoral access for TAVR.   During the course of the patient's preoperative work up they have been evaluated comprehensively by a multidisciplinary team of specialists coordinated through the [facility] in the [facility].  They have been demonstrated to suffer from symptomatic severe aortic stenosis as noted above. The patient has been counseled extensively as to the relative risks and benefits of all options for the treatment of severe aortic stenosis including long term medical therapy, conventional surgery for aortic valve  replacement, and transcatheter aortic valve replacement.  The patient has been independently evaluated by two cardiac surgeons including Dr Prescott Gum and Dr. Cyndia Bent and they are felt to be at high risk for conventional surgical aortic valve replacement. Both surgeons indicated the patient would be a poor candidate for conventional surgery. Based upon review of all of the patient's preoperative diagnostic tests they are felt to be candidate for transcatheter aortic valve replacement using the transfemoral approach as an alternative to high risk conventional surgery.    Following the decision to proceed with transcatheter aortic valve replacement, a discussion has been held regarding what types of management strategies would be attempted intraoperatively in the event of life-threatening complications, including whether or not the patient would be considered a candidate for the use of cardiopulmonary bypass and/or conversion to open sternotomy for attempted surgical intervention.  The patient has been advised of a variety of complications that might develop peculiar to this approach including but not limited to risks of death, stroke, paravalvular leak, aortic dissection or other major vascular complications, aortic annulus rupture, device embolization, cardiac rupture or perforation, acute myocardial infarction, arrhythmia, heart block or bradycardia requiring permanent pacemaker placement, congestive heart failure, respiratory failure, renal failure, pneumonia, infection, other late complications related to structural valve deterioration or migration, or other complications that might ultimately cause a temporary or permanent loss of functional independence or other long term morbidity.  The patient provides full informed consent for the procedure as described and all questions were answered preoperatively.    DETAILS OF THE OPERATIVE PROCEDURE  PREPARATION:   The patient is brought to the operating room on  the above mentioned date and central monitoring was established by the anesthesia team  including placement of a radial arterial line. The patient is placed in the supine position on the operating table.  Intravenous antibiotics are administered. Conscious sedation is used.   Baseline transthoracic echocardiogram was performed. The patient's chest, abdomen, both groins, and both lower extremities are prepared and draped in a sterile manner. A time out procedure is performed.   PERIPHERAL ACCESS:   Using the modified Seldinger technique, femoral arterial and venous access were obtained with placement of 6 Fr sheaths on the left side.  A pigtail diagnostic catheter was passed through the femoral arterial sheath under fluoroscopic guidance into the aortic root.  A temporary transvenous pacemaker catheter was passed through the femoral venous sheath under fluoroscopic guidance into the right ventricle.  The pacemaker was tested to ensure stable lead placement and pacemaker capture. Aortic root angiography was performed in order to determine the optimal angiographic angle for valve deployment.  TRANSFEMORAL ACCESS:  A micropuncture kit was used to access the right femoral artery under u/s guidance. Angiography confirmed placement above the bifurcation. Pre-closure with double Pro-Glide closure devices. The patient was heparinized systemically and ACT verified > 250 seconds.    A 14 Fr transfemoral E-sheath was introduced into the right femoral artery after progressively dilating over an Amplatz superstiff wire. An AL-2 catheter was used to direct a straight-tip exchange length wire across the native aortic valve into the left ventricle. This was exchanged out for a pigtail catheter and position was confirmed in the LV apex. Simultaneous LV and Ao pressures were recorded.  The pigtail catheter was then exchanged for an Amplatz Extra-stiff wire in the LV apex.   TRANSCATHETER HEART VALVE DEPLOYMENT:  An  Edwards Sapien 3 THV (size 26 mm) was prepared and crimped per manufacturer's guidelines, and the proper orientation of the valve is confirmed on the [employer] delivery system. The valve was advanced through the introducer sheath using normal technique until in an appropriate position in the abdominal aorta beyond the sheath tip. The balloon was then retracted and using the fine-tuning wheel was centered on the valve. The valve was then advanced across the aortic arch using appropriate flexion of the catheter. The valve was carefully positioned across the aortic valve annulus. The Commander catheter was retracted using normal technique. Once final position of the valve has been confirmed by angiographic assessment, the valve is deployed while temporarily holding ventilation and during rapid ventricular pacing to maintain systolic blood pressure < 50 mmHg and pulse pressure < 10 mmHg. The balloon inflation is held for >3 seconds after reaching full deployment volume. Once the balloon has fully deflated the balloon is retracted into the ascending aorta and valve function is assessed using TEE. There is felt to be no paravalvular leak and no central aortic insufficiency.  The patient's hemodynamic recovery following valve deployment is good.  The deployment balloon and guidewire are both removed. Echo demostrated acceptable post-procedural gradients, stable mitral valve function, and no AI.   PROCEDURE COMPLETION:  The closure device was completed. Protamine was administered once femoral arterial repair was complete. The temporary pacemaker, pigtail catheters and femoral sheaths were removed with manual pressure used for hemostasis.   The patient tolerated the procedure well and is transported to the surgical intensive care in stable condition. There were no immediate intraoperative complications. All sponge instrument and needle counts are verified correct at completion of the operation.   No blood  products were administered during the operation.  The patient received a total of 54 mL  of intravenous contrast during the procedure.  Lauree Chandler MD 07/31/2017 11:00 AM

## 2017-07-31 NOTE — Anesthesia Procedure Notes (Signed)
Central Venous Catheter Insertion Performed by: Marcene DuosFITZGERALD, Osman Calzadilla, anesthesiologist Start/End8/14/2018 9:50 AM, 07/31/2017 10:00 AM Patient location: Pre-op. Preanesthetic checklist: patient identified, IV checked, site marked, risks and benefits discussed, surgical consent, monitors and equipment checked, pre-op evaluation, timeout performed and anesthesia consent Position: Trendelenburg Lidocaine 1% used for infiltration and patient sedated Hand hygiene performed , maximum sterile barriers used  and Seldinger technique used Catheter size: 8 Fr Total catheter length 16. Central line was placed.Double lumen Procedure performed using ultrasound guided technique. Ultrasound Notes:anatomy identified, needle tip was noted to be adjacent to the nerve/plexus identified, no ultrasound evidence of intravascular and/or intraneural injection and image(s) printed for medical record Attempts: 1 Following insertion, dressing applied, line sutured and Biopatch. Post procedure assessment: blood return through all ports  Patient tolerated the procedure well with no immediate complications.

## 2017-07-31 NOTE — Anesthesia Procedure Notes (Signed)
Procedure Name: MAC Date/Time: 07/31/2017 10:26 AM Performed by: Mervyn Gay Pre-anesthesia Checklist: Patient identified, Patient being monitored, Timeout performed, Emergency Drugs available and Suction available Patient Re-evaluated:Patient Re-evaluated prior to induction Oxygen Delivery Method: Simple face mask Number of attempts: 1 Placement Confirmation: positive ETCO2 Dental Injury: Teeth and Oropharynx as per pre-operative assessment

## 2017-07-31 NOTE — Op Note (Signed)
[facility]   MULTIDISCIPLINARY HEART VALVE TEAM   TAVR OPERATIVE NOTE   Date of Procedure:  07/31/2017  Preoperative Diagnosis: Severe Aortic Stenosis   Postoperative Diagnosis: Same   Procedure:    Transcatheter Aortic Valve Replacement - Percutaneous Right Transfemoral Approach  Edwards Sapien 3 THV (size 26 mm, model # [id number], serial # [id number])   Co-Surgeons:  Alleen Borne, MD and Verne Carrow, MD   Anesthesiologist:  Marcene Duos, MD  Echocardiographer:  Charlton Haws, MD  Pre-operative Echo Findings:  Severe aortic stenosis  Normal left ventricular systolic function  Post-operative Echo Findings:  no paravalvular leak  Normal left ventricular systolic function   BRIEF CLINICAL NOTE AND INDICATIONS FOR SURGERY  The patient is an 83 year old gentleman with a history of DM, hypertension, hyperlipidemia, chronic kidney disease, aortic stenosis and CAD s/p acute inferior STEMI with PCI of the RCA followed by CABG x 4 in 2005. He has a several month history of progressive shortness of breath with exertion and an echo on 05/25/2017 showed severe AS with a mean gradient of 40 mm Hg with an EF of 55-60%. His previous echo in 09/2016 showed a mean gradient of 32 mm Hg. He underwent cath on 05/28/2017 which showed patent grafts to the PDA, diagonal, and OM with a patent LIMA to the LAD. There was severe native vessel CAD. The peak to peak gradient was 35 mm Hg and calculated AVA 1.27 cm2. He was referred for pulmonary evaluation which was done by Dr. Sherene Sires and PFT's were normal.  He has had SOB with exertion for at least 6 months. His wife died in Mar 14, 2017 of pancreatic cancer and he really did not think about the symptoms prior to that because he was so busy with her. He has been fatigued and has no energy. He denies orthopnea, dizziness, peripheral edema.   He has stage D, severe, symptomatic aortic stenosis with progressive exertional fatigue and  shortness of breath, NYHA class II, consistent with chronic diastolic heart failure. I have personally reviewed his echo, cath and CT studies. His echo shows a trileaflet aortic valve with severe leaflet calcification and severely reduced mobility with a mean gradient of 40 mm Hg and an AVA of 0.87 cm2. LV function is normal. His cath shows that all 4 bypass grafts are patent without any significant ischemic territories. I agree that AVR is indicated in this patient but he would be at high risk for a redo sternotomy and AVR at 83 years old. I think TAVR is the best option for him. His gated cardiac CT shows anatomy suitable for a 26 mm Sapien 3 valve. 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   The patient was counseled at length regarding treatment alternatives for management of severe symptomatic aortic stenosis. The risks and benefits of surgical intervention has been discussed in detail. Long-term prognosis with medical therapy was discussed. Alternative approaches such as conventional surgical aortic valve replacement, transcatheter aortic valve replacement, and palliative medical therapy were compared and contrasted at length. This discussion was placed in the context of the patient's own specific clinical presentation and past medical history. All of hisquestions have been addressed. The patient is eager to proceed with TAVR.  Following the decision to proceed with transcatheter aortic valve replacement, a discussion was held regarding what types of management strategies would be attempted intraoperatively in the event  of life-threatening complications, including whether or not the patient would be considered a candidate for the use of cardiopulmonary bypass and/or conversion to open sternotomy for attempted surgical intervention. The patient is aware of the fact that transient  use of cardiopulmonary bypass may be necessary, but the patient specifically states that she would not wish to undergo redo median sternotomy under any circumstances, even if she were to develop potentially lethal complications related to transcatheter valves appointment.   The patient has been advised of a variety of complications that might develop including but not limited to risks of death, stroke, paravalvular leak, aortic dissection or other major vascular complications, aortic annulus rupture, device embolization, cardiac rupture or perforation, mitral regurgitation, acute myocardial infarction, arrhythmia, heart block or bradycardia requiring permanent pacemaker placement, congestive heart failure, respiratory failure, renal failure, pneumonia, infection, other late complications related to structural valve deterioration or migration, or other complications that might ultimately cause a temporary or permanent loss of functional independence or other long term morbidity. The patient provides full informed consent for the procedure as described and all questions were answered.   DETAILS OF THE OPERATIVE PROCEDURE  PREPARATION:    The patient is brought to the operating room on the above mentioned date and central monitoring was established by the anesthesia team including placement of a central venous line and radial arterial line. The patient is placed in the supine position on the operating table.  Intravenous antibiotics are administered. The patient is monitored closely throughout the procedure under conscious sedation.  Baseline transthoracic echocardiogram was performed. The patient's chest, abdomen, both groins, and both lower extremities are prepared and draped in a sterile manner. A time out procedure is performed.   PERIPHERAL ACCESS:    Using the modified Seldinger technique, femoral arterial and venous access was obtained with placement of 6 Fr sheaths on the left side.  A  pigtail diagnostic catheter was passed through the left arterial sheath under fluoroscopic guidance into the aortic root.  A temporary transvenous pacemaker catheter was passed through the left femoral venous sheath under fluoroscopic guidance into the right ventricle.  The pacemaker was tested to ensure stable lead placement and pacemaker capture. Aortic root angiography was performed in order to determine the optimal angiographic angle for valve deployment.   TRANSFEMORAL ACCESS:   Percutaneous transfemoral access and sheath placement was performed by Dr. Clifton James using ultrasound guidance.  The right common femoral artery was cannulated using a micropuncture needle and appropriate location was verified using hand injection angiogram.  A pair of Abbott Perclose percutaneous closure devices were placed and a 6 French sheath replaced into the femoral artery.  The patient was heparinized systemically and ACT verified > 250 seconds.    A 14 Fr transfemoral E-sheath was introduced into the right femoral artery after progressively dilating over an Amplatz superstiff wire. An AL-1 catheter was used to direct a straight-tip exchange length wire across the native aortic valve into the left ventricle. This was exchanged out for a pigtail catheter and position was confirmed in the LV apex. Simultaneous LV and Ao pressures were recorded.  The pigtail catheter was exchanged for an Amplatz Extra-stiff wire in the LV apex.  Echocardiography was utilized to confirm appropriate wire position and no sign of entanglement in the mitral subvalvular apparatus.   BALLOON AORTIC VALVULOPLASTY:   Not performed   TRANSCATHETER HEART VALVE DEPLOYMENT:   An Edwards Sapien 3 transcatheter heart valve (size 26 mm, model #[id number], serial #[id number]) was prepared and  crimped per manufacturer's guidelines, and the proper orientation of the valve is confirmed on the [employer] Commander delivery system. The valve was advanced through  the introducer sheath using normal technique until in an appropriate position in the abdominal aorta beyond the sheath tip. The balloon was then retracted and using the fine-tuning wheel was centered on the valve. The valve was then advanced across the aortic arch using appropriate flexion of the catheter. The valve was carefully positioned across the aortic valve annulus. The Commander catheter was retracted using normal technique. Once final position of the valve has been confirmed by angiographic assessment, the valve is deployed while temporarily holding ventilation and during rapid ventricular pacing to maintain systolic blood pressure < 50 mmHg and pulse pressure < 10 mmHg. The balloon inflation is held for >3 seconds after reaching full deployment volume. Once the balloon has fully deflated the balloon is retracted into the ascending aorta and valve function is assessed using echocardiography. There is felt to be no paravalvular leak and no central aortic insufficiency.  The patient's hemodynamic recovery following valve deployment is good.  The deployment balloon and guidewire are both removed.    PROCEDURE COMPLETION:   The sheath was removed and femoral artery closure performed by Dr Clifton JamesMcAlhany.  Protamine was administered once femoral arterial repair was complete. The temporary pacemaker, pigtail catheters and femoral sheaths were removed with manual pressure used for hemostasis.   The patient tolerated the procedure well and is transported to the surgical intensive care in stable condition. There were no immediate intraoperative complications. All sponge instrument and needle counts are verified correct at completion of the operation.   No blood products were administered during the operation.  The patient received a total of 54 mL of intravenous contrast during the procedure.   Alleen BorneBryan K Mordche Hedglin, MD 07/31/2017 1:26 PM

## 2017-07-31 NOTE — Anesthesia Postprocedure Evaluation (Signed)
Anesthesia Post Note  Patient: KUZEY OGATA  Procedure(s) Performed: Procedure(s) (LRB): TRANSCATHETER AORTIC VALVE REPLACEMENT, TRANSFEMORAL (N/A) TRANSESOPHAGEAL ECHOCARDIOGRAM (TEE) (N/A)     Patient location during evaluation: ICU Anesthesia Type: MAC Level of consciousness: awake and alert Pain management: pain level controlled Vital Signs Assessment: post-procedure vital signs reviewed and stable Respiratory status: spontaneous breathing, nonlabored ventilation, respiratory function stable and patient connected to nasal cannula oxygen Cardiovascular status: stable and blood pressure returned to baseline Anesthetic complications: no    Last Vitals:  Vitals:   07/31/17 1215 07/31/17 1245  BP:  (!) 101/50  Pulse: (!) 170 (!) 51  Resp:  16  Temp:  36.7 C  SpO2:  100%    Last Pain:  Vitals:   07/31/17 1245  TempSrc: Axillary                 Tiajuana Amass

## 2017-07-31 NOTE — Interval H&P Note (Signed)
History and Physical Interval Note:  07/31/2017 10:23 AM  Cory Bradley  has presented today for surgery, with the diagnosis of SEVERE AS  The various methods of treatment have been discussed with the patient and family. After consideration of risks, benefits and other options for treatment, the patient has consented to  Procedure(s): TRANSCATHETER AORTIC VALVE REPLACEMENT, TRANSFEMORAL (N/A) TRANSESOPHAGEAL ECHOCARDIOGRAM (TEE) (N/A) as a surgical intervention .  The patient's history has been reviewed, patient examined, no change in status, stable for surgery.  I have reviewed the patient's chart and labs.  Questions were answered to the patient's satisfaction.     Alleen BorneBryan K Xiamara Hulet

## 2017-07-31 NOTE — Anesthesia Preprocedure Evaluation (Addendum)
Anesthesia Evaluation  Patient identified by MRN, date of birth, ID band Patient awake    Reviewed: Allergy & Precautions, NPO status , Patient's Chart, lab work & pertinent test results, reviewed documented beta blocker date and time   Airway Mallampati: II  TM Distance: >3 FB Neck ROM: Full    Dental  (+) Dental Advisory Given   Pulmonary Current Smoker,    breath sounds clear to auscultation       Cardiovascular hypertension, Pt. on medications and Pt. on home beta blockers + CAD and + Peripheral Vascular Disease   Rhythm:Regular Rate:Normal     Neuro/Psych negative neurological ROS     GI/Hepatic negative GI ROS, Neg liver ROS,   Endo/Other  diabetes  Renal/GU CRFRenal disease     Musculoskeletal  (+) Arthritis ,   Abdominal   Peds  Hematology negative hematology ROS (+)   Anesthesia Other Findings   Reproductive/Obstetrics                            Lab Results  Component Value Date   WBC 8.2 07/26/2017   HGB 13.0 07/26/2017   HCT 38.9 (L) 07/26/2017   MCV 89.4 07/26/2017   PLT 140 (L) 07/26/2017   Lab Results  Component Value Date   CREATININE 1.31 (H) 07/26/2017   BUN 31 (H) 07/26/2017   NA 138 07/26/2017   K 4.4 07/26/2017   CL 107 07/26/2017   CO2 22 07/26/2017   Lab Results  Component Value Date   INR 1.04 07/26/2017   INR 1.0 05/16/2017   INR 1.2 11/06/2007    Anesthesia Physical Anesthesia Plan  ASA: IV  Anesthesia Plan: MAC   Post-op Pain Management:    Induction: Intravenous  PONV Risk Score and Plan: 0 and Ondansetron, Dexamethasone and Treatment may vary due to age or medical condition  Airway Management Planned: Natural Airway and Simple Face Mask  Additional Equipment: Arterial line, CVP and Ultrasound Guidance Line Placement  Intra-op Plan:   Post-operative Plan:   Informed Consent: I have reviewed the patients History and Physical,  chart, labs and discussed the procedure including the risks, benefits and alternatives for the proposed anesthesia with the patient or authorized representative who has indicated his/her understanding and acceptance.   Dental advisory given  Plan Discussed with: CRNA  Anesthesia Plan Comments:        Anesthesia Quick Evaluation

## 2017-07-31 NOTE — Progress Notes (Signed)
  Echocardiogram 2D Echocardiogram has been performed.  Delcie RochENNINGTON, Mikeal Winstanley 07/31/2017, 12:44 PM

## 2017-07-31 NOTE — Brief Op Note (Signed)
07/31/2017  12:25 PM  PATIENT:  Cory Bradley  83 y.o. male  PRE-OPERATIVE DIAGNOSIS:  SEVERE AS  POST-OPERATIVE DIAGNOSIS:  Severe As  PROCEDURE:  Procedure(s): TRANSCATHETER AORTIC VALVE REPLACEMENT, TRANSFEMORAL (N/A) TRANSESOPHAGEAL ECHOCARDIOGRAM (TEE) (N/A)  SURGEON:  Surgeon(s) and Role:    * Burnell Blanks, MD - Primary    * Kace Hartje, Fernande Boyden, MD - Co-Surgeon (only for TAVR, EVAR, and Barostim procedure)  PHYSICIAN ASSISTANT: none    ANESTHESIA:   local and MAC  EBL:  Total I/O In: 1100 [I.V.:1100] Out: -   BLOOD ADMINISTERED:none  DRAINS: none   LOCAL MEDICATIONS USED:  XYLOCAINE  and LIDOCAINE   SPECIMEN:  No Specimen  DISPOSITION OF SPECIMEN:  N/A  COUNTS:  YES  TOURNIQUET:  * No tourniquets in log *  DICTATION: .Note written in EPIC  PLAN OF CARE: Admit to inpatient   PATIENT DISPOSITION:  ICU - extubated and stable.   Delay start of Pharmacological VTE agent (>24hrs) due to surgical blood loss or risk of bleeding: yes

## 2017-08-01 ENCOUNTER — Encounter (HOSPITAL_COMMUNITY): Payer: Self-pay | Admitting: Cardiovascular Disease

## 2017-08-01 DIAGNOSIS — I350 Nonrheumatic aortic (valve) stenosis: Secondary | ICD-10-CM

## 2017-08-01 DIAGNOSIS — Z953 Presence of xenogenic heart valve: Secondary | ICD-10-CM

## 2017-08-01 DIAGNOSIS — Z954 Presence of other heart-valve replacement: Secondary | ICD-10-CM

## 2017-08-01 LAB — GLUCOSE, CAPILLARY
GLUCOSE-CAPILLARY: 113 mg/dL — AB (ref 65–99)
GLUCOSE-CAPILLARY: 120 mg/dL — AB (ref 65–99)
GLUCOSE-CAPILLARY: 126 mg/dL — AB (ref 65–99)
Glucose-Capillary: 130 mg/dL — ABNORMAL HIGH (ref 65–99)
Glucose-Capillary: 138 mg/dL — ABNORMAL HIGH (ref 65–99)

## 2017-08-01 LAB — BASIC METABOLIC PANEL
Anion gap: 7 (ref 5–15)
BUN: 15 mg/dL (ref 6–20)
CHLORIDE: 108 mmol/L (ref 101–111)
CO2: 22 mmol/L (ref 22–32)
Calcium: 8.6 mg/dL — ABNORMAL LOW (ref 8.9–10.3)
Creatinine, Ser: 1.11 mg/dL (ref 0.61–1.24)
GFR calc Af Amer: >60 mL/min (ref >60)
GFR, EST NON AFRICAN AMERICAN: 60 mL/min — AB (ref >60)
GLUCOSE: 113 mg/dL — AB (ref 65–99)
POTASSIUM: 4.2 mmol/L (ref 3.5–5.1)
SODIUM: 137 mmol/L (ref 135–145)

## 2017-08-01 LAB — CBC
HEMATOCRIT: 35.2 % — AB (ref 39.0–52.0)
HEMOGLOBIN: 12 g/dL — AB (ref 13.0–17.0)
MCH: 29.8 pg (ref 26.0–34.0)
MCHC: 34.1 g/dL (ref 30.0–36.0)
MCV: 87.3 fL (ref 78.0–100.0)
Platelets: 117 10*3/uL — ABNORMAL LOW (ref 150–400)
RBC: 4.03 MIL/uL — AB (ref 4.22–5.81)
RDW: 13.5 % (ref 11.5–15.5)
WBC: 16.5 10*3/uL — ABNORMAL HIGH (ref 4.0–10.5)

## 2017-08-01 MED ORDER — INSULIN ASPART 100 UNIT/ML ~~LOC~~ SOLN
0.0000 [IU] | Freq: Three times a day (TID) | SUBCUTANEOUS | Status: DC
  Administered 2017-08-01: 2 [IU] via SUBCUTANEOUS

## 2017-08-01 MED ORDER — IRBESARTAN 150 MG PO TABS
150.0000 mg | ORAL_TABLET | Freq: Every day | ORAL | Status: DC
  Administered 2017-08-01 – 2017-08-02 (×2): 150 mg via ORAL
  Filled 2017-08-01 (×2): qty 1

## 2017-08-01 MED ORDER — CLOPIDOGREL BISULFATE 75 MG PO TABS
75.0000 mg | ORAL_TABLET | Freq: Every day | ORAL | Status: DC
  Administered 2017-08-01 – 2017-08-02 (×2): 75 mg via ORAL
  Filled 2017-08-01 (×2): qty 1

## 2017-08-01 MED ORDER — PROPRANOLOL HCL ER 120 MG PO CP24
120.0000 mg | ORAL_CAPSULE | Freq: Every day | ORAL | Status: DC
  Administered 2017-08-01 – 2017-08-02 (×2): 120 mg via ORAL
  Filled 2017-08-01 (×3): qty 1

## 2017-08-01 MED ORDER — ASPIRIN EC 81 MG PO TBEC
81.0000 mg | DELAYED_RELEASE_TABLET | Freq: Every day | ORAL | Status: DC
  Administered 2017-08-02: 81 mg via ORAL
  Filled 2017-08-01: qty 1

## 2017-08-01 MED ORDER — SODIUM CHLORIDE 0.9% FLUSH: 3.0000 mL | INTRAVENOUS | Status: DC | PRN

## 2017-08-01 MED ORDER — SODIUM CHLORIDE 0.9 % IV SOLN: 250.0000 mL | INTRAVENOUS | Status: DC | PRN

## 2017-08-01 MED ORDER — SODIUM CHLORIDE 0.9% FLUSH
3.0000 mL | Freq: Two times a day (BID) | INTRAVENOUS | Status: DC
  Administered 2017-08-01 – 2017-08-02 (×2): 3 mL via INTRAVENOUS

## 2017-08-01 NOTE — Progress Notes (Signed)
1 Day Post-Op Procedure(s) (LRB): TRANSCATHETER AORTIC VALVE REPLACEMENT, TRANSFEMORAL (N/A) TRANSESOPHAGEAL ECHOCARDIOGRAM (TEE) (N/A) Subjective: Only complaint is not sleeping overnight due to BP cuff, alarms and noises. No chest pain or dyspnea.   Objective: Vital signs in last 24 hours: Temp:  [97.3 F (36.3 C)-98.2 F (36.8 C)] 98.2 F (36.8 C) (08/15 0339) Pulse Rate:  [19-295] 35 (08/15 0500) Resp:  [11-29] 17 (08/15 0500) BP: (82-159)/(47-90) 123/90 (08/15 0500) SpO2:  [94 %-100 %] 98 % (08/15 0500) Arterial Line BP: (85-182)/(39-86) 135/51 (08/15 0500) Weight:  [75.2 kg (165 lb 11.2 oz)-75.3 kg (166 lb)] 75.2 kg (165 lb 11.2 oz) (08/15 0339)  Hemodynamic parameters for last 24 hours:    Intake/Output from previous day: 08/14 0701 - 08/15 0700 In: 2326.3 [P.O.:480; I.V.:1596.3; IV Piggyback:250] Out: 1020 [Urine:1000; Blood:20] Intake/Output this shift: No intake/output data recorded.  General appearance: alert and cooperative Neurologic: intact Heart: regular rate and rhythm, S1, S2 normal, no murmur, click, rub or gallop Lungs: clear to auscultation bilaterally Extremities: extremities normal, atraumatic, no cyanosis or edema Wound: right groin access site ok  Lab Results:  Recent Labs  07/31/17 1238 07/31/17 1240 08/01/17 0445  WBC 8.4  --  16.5*  HGB 11.2* 10.9* 12.0*  HCT 34.0* 32.0* 35.2*  PLT 116*  --  117*   BMET:  Recent Labs  07/31/17 1219 07/31/17 1240 08/01/17 0445  NA 144 141 137  K 3.9 4.5 4.2  CL 110  --  108  CO2  --   --  22  GLUCOSE 114* 127* 113*  BUN 12  --  15  CREATININE 0.70  --  1.11  CALCIUM  --   --  8.6*    PT/INR: No results for input(s): LABPROT, INR in the last 72 hours. ABG    Component Value Date/Time   PHART 7.323 (L) 07/31/2017 1245   HCO3 26.0 07/31/2017 1245   TCO2 27 07/31/2017 1245   ACIDBASEDEF 1.0 07/31/2017 1245   O2SAT 94.0 07/31/2017 1245   CBG (last 3)   Recent Labs  07/31/17 2112  07/31/17 2323 08/01/17 0344  GLUCAP 161* 178* 130*    *Deweese*                   *[facility]*                         [address]                        [place], [place] [address]                            [phone]  ------------------------------------------------------------------- Transthoracic Echocardiography  Patient:    Cory Bradley, Cory Bradley MR #:       [id number] Study Date: 07/31/2017 Gender:     M Age:        83 Height:     188 cm Weight:     75.3 kg BSA:        1.98 m^2 Pt. Status: Room:       2H15C   ORDERING     Evelene CroonBryan Yisel Megill, MD  REFERRING    Evelene CroonBryan Dezire Turk, MD  ADMITTING    Verne CarrowMcAlhany, Christopher  ATTENDING    Verne CarrowMcAlhany, Christopher  SONOGRAPHER  Arvil Chacoachel Foster  PERFORMING   [facility], Inpatient  cc:  ------------------------------------------------------------------- LV EF: 55% -  60%  ------------------------------------------------------------------- Indications:      424.1 Aortic valve disorders.  ------------------------------------------------------------------- History:   PMH:  Aortic stenosis S/P TAVR today. 4V CAD S/P CABG. SVT. Carotid artery disease. Diabetes. Hypertension. Hyperlipidemia. CKD. Mild COPD. PVCs.  ------------------------------------------------------------------- Study Conclusions  - Left ventricle: The cavity size was normal. There was mild   concentric hypertrophy. Systolic function was normal. The   estimated ejection fraction was in the range of 55% to 60%. Wall   motion was normal; there were no regional wall motion   abnormalities. Features are consistent with a pseudonormal left   ventricular filling pattern, with concomitant abnormal relaxation   and increased filling pressure (grade 2 diastolic dysfunction). - Aortic valve: A stent-valve (TAVR) bioprosthesis was present and   functioning normally. Valve area (VTI): 1.42 cm^2. Valve area   (Vmax): 1.49 cm^2. Valve area (Vmean): 1.34  cm^2. - Mitral valve: There was mild to moderate regurgitation directed   centrally. - Left atrium: The atrium was severely dilated. - Right ventricle: The cavity size was mildly dilated. Systolic   function was mildly reduced. - Right atrium: The atrium was severely dilated. - Atrial septum: No defect or patent foramen ovale was identified.  ------------------------------------------------------------------- Study data:  Comparison was made to the study of 07/31/2017.  Study status:  Routine.  Procedure:  The patient reported no pain pre or post test. Transthoracic echocardiography. Image quality was adequate.  Study completion:  There were no complications. Transthoracic echocardiography.  M-mode, complete 2D, spectral Doppler, and color Doppler.  Birthdate:  Patient birthdate: May 18, 1933.  Age:  Patient is 83 yr old.  Sex:  Gender: male. BMI: 21.3 kg/m^2.  Blood pressure:     101/50  Patient status: Inpatient.  Study date:  Study date: 07/31/2017. Study time: 01:31 PM.  Location:  ICU/CCU  -------------------------------------------------------------------  ------------------------------------------------------------------- Left ventricle:  The cavity size was normal. There was mild concentric hypertrophy. Systolic function was normal. The estimated ejection fraction was in the range of 55% to 60%. Wall motion was normal; there were no regional wall motion abnormalities. Features are consistent with a pseudonormal left ventricular filling pattern, with concomitant abnormal relaxation and increased filling pressure (grade 2 diastolic dysfunction).  ------------------------------------------------------------------- Aortic valve:  A stent-valve (TAVR) bioprosthesis was present and functioning normally.  Doppler:   There was trivial perivalvular regurgitation.      VTI ratio of LVOT to aortic valve: 0.45. Valve area (VTI): 1.42 cm^2. Indexed valve area (VTI): 0.72  cm^2/m^2. Peak velocity ratio of LVOT to aortic valve: 0.47. Valve area (Vmax): 1.49 cm^2. Indexed valve area (Vmax): 0.75 cm^2/m^2. Mean velocity ratio of LVOT to aortic valve: 0.43. Valve area (Vmean): 1.34 cm^2. Indexed valve area (Vmean): 0.68 cm^2/m^2.    Mean gradient (S): 5 mm Hg. Peak gradient (S): 8 mm Hg.  ------------------------------------------------------------------- Aorta:  Aortic root: The aortic root was normal in size. Ascending aorta: The ascending aorta was normal in size.  ------------------------------------------------------------------- Mitral valve:   Structurally normal valve.   Leaflet separation was normal.  Doppler:  Transvalvular velocity was within the normal range. There was no evidence for stenosis. There was mild to moderate regurgitation directed centrally.    Peak gradient (D): 4 mm Hg.  ------------------------------------------------------------------- Left atrium:  The atrium was severely dilated.  ------------------------------------------------------------------- Atrial septum:  No defect or patent foramen ovale was identified.   ------------------------------------------------------------------- Right ventricle:  The cavity size was mildly dilated. Systolic function was mildly reduced.  ------------------------------------------------------------------- Pulmonic valve:    Structurally normal valve.  Cusp separation was normal.  Doppler:  Transvalvular velocity was within the normal range. There was no regurgitation.  ------------------------------------------------------------------- Tricuspid valve:   Structurally normal valve.   Leaflet separation was normal.  Doppler:  Transvalvular velocity was within the normal range. There was no regurgitation.  ------------------------------------------------------------------- Pulmonary artery:    Systolic pressure could not be  accurately estimated.  ------------------------------------------------------------------- Right atrium:  The atrium was severely dilated.  ------------------------------------------------------------------- Pericardium:  There was no pericardial effusion.  ------------------------------------------------------------------- Systemic veins: Inferior vena cava: The vessel was normal in size. The respirophasic diameter changes were in the normal range (= 50%), consistent with normal central venous pressure.  ------------------------------------------------------------------- Measurements   Left ventricle                           Value          Reference  LV ID, ED, PLAX chordal                  45.8  mm       43 - 52  LV ID, ES, PLAX chordal                  32.9  mm       23 - 38  LV fx shortening, PLAX chordal   (L)     28    %        >=29  LV PW thickness, ED                      13.2  mm       ----------  IVS/LV PW ratio, ED                      0.99           <=1.3  Stroke volume, 2D                        57    ml       ----------  Stroke volume/bsa, 2D                    29    ml/m^2   ----------  LV Bradley&', lateral                           5.33  cm/s     ----------  LV Bradley/Bradley&', lateral                         18.01          ----------  LV Bradley&', medial                            5.22  cm/s     ----------  LV Bradley/Bradley&', medial                          18.39          ----------  LV Bradley&', average                           5.28  cm/s     ----------  LV Bradley/Bradley&', average  18.2           ----------    Ventricular septum                       Value          Reference  IVS thickness, ED                        13.1  mm       ----------    LVOT                                     Value          Reference  LVOT ID, S                               20    mm       ----------  LVOT area                                3.14  cm^2     ----------  LVOT peak velocity, S                     68.7  cm/s     ----------  LVOT mean velocity, S                    43.4  cm/s     ----------  LVOT VTI, S                              18.2  cm       ----------  LVOT peak gradient, S                    2     mm Hg    ----------    Aortic valve                             Value          Reference  Aortic valve peak velocity, S            145   cm/s     ----------  Aortic valve mean velocity, S            102   cm/s     ----------  Aortic valve VTI, S                      40.3  cm       ----------  Aortic mean gradient, S                  5     mm Hg    ----------  Aortic peak gradient, S                  8     mm Hg    ----------  VTI ratio, LVOT/AV                       0.45           ----------  Aortic valve area, VTI                   1.42  cm^2     ----------  Aortic valve area/bsa, VTI               0.72  cm^2/m^2 ----------  Velocity ratio, peak, LVOT/AV            0.47           ----------  Aortic valve area, peak velocity         1.49  cm^2     ----------  Aortic valve area/bsa, peak              0.75  cm^2/m^2 ----------  velocity  Velocity ratio, mean, LVOT/AV            0.43           ----------  Aortic valve area, mean velocity         1.34  cm^2     ----------  Aortic valve area/bsa, mean              0.68  cm^2/m^2 ----------  velocity    Aorta                                    Value          Reference  Aortic root ID, ED                       29    mm       ----------    Left atrium                              Value          Reference  LA ID, A-P, ES                           57    mm       ----------  LA ID/bsa, A-P                   (H)     2.88  cm/m^2   <=2.2  LA volume, S                             83.9  ml       ----------  LA volume/bsa, S                         42.5  ml/m^2   ----------  LA volume, ES, 1-p A4C                   84.7  ml       ----------  LA volume/bsa, ES, 1-p A4C               42.9  ml/m^2   ----------  LA volume, ES, 1-p A2C                    84.1  ml       ----------  LA volume/bsa, ES, 1-p A2C  42.6  ml/m^2   ----------    Mitral valve                             Value          Reference  Mitral Bradley-wave peak velocity              96    cm/s     ----------  Mitral A-wave peak velocity              74.7  cm/s     ----------  Mitral deceleration time         (H)     327   ms       150 - 230  Mitral peak gradient, D                  4     mm Hg    ----------  Mitral Bradley/A ratio, peak                   1.3            ----------    Right atrium                             Value          Reference  RA ID, S-I, ES, A4C              (H)     69    mm       34 - 49  RA area, ES, A4C                 (H)     28.3  cm^2     8.3 - 19.5  RA volume, ES, A/L                       91.6  ml       ----------  RA volume/bsa, ES, A/L                   46.4  ml/m^2   ----------    Systemic veins                           Value          Reference  Estimated CVP                            8     mm Hg    ----------    Right ventricle                          Value          Reference  RV ID, minor axis, ED, A4C       (H)     49.06 mm       26 - 43  RV ID, minor axis, ED, A4C base          38.6  mm       ----------  TAPSE  10.7  mm       ----------  RV s&', lateral, S                        6.46  cm/s     ----------    Pulmonic valve                           Value          Reference  Pulmonic regurg velocity, ED             107   cm/s     ----------  Legend: (L)  and  (H)  mark values outside specified reference range.  ------------------------------------------------------------------- Prepared and Electronically Authenticated by  Thurmon Fair, MD 2018-08-14T15:50:19  Assessment/Plan: S/P Procedure(s) (LRB): TRANSCATHETER AORTIC VALVE REPLACEMENT, TRANSFEMORAL (N/A) TRANSESOPHAGEAL ECHOCARDIOGRAM (TEE) (N/A)  POD 1  He is doing well. His BP is up so I think we can restart  Inderal and Avapro.  2D echo done yesterday looks good.  DC arterial line and central line  Mobilize  Transfer to 4E and plan home tomorrow with his daughter.   LOS: 1 day    Alleen Borne 08/01/2017

## 2017-08-01 NOTE — Care Management Note (Addendum)
Case Management Note  Patient Details  Name: Cory GroutCharles E Bradley MRN: [id number] Date of Birth: 04/03/1933  Subjective/Objective:  From home, daughter is support person,  POD 1 TAVR,  bp elevated will ambulate and transfer to SDU plan for dc tomorrow with daughter.                   Action/Plan: NCM will follow for dc needs.  Expected Discharge Date:                  Expected Discharge Plan:     In-House Referral:     Discharge planning Services  CM Consult  Post Acute Care Choice:    Choice offered to:     DME Arranged:    DME Agency:     HH Arranged:    HH Agency:     Status of Service:  In process, will continue to follow  If discussed at Long Length of Stay Meetings, dates discussed:    Additional Comments:  Leone Havenaylor, Saivion Goettel Clinton, RN 08/01/2017, 12:40 PM

## 2017-08-01 NOTE — Progress Notes (Signed)
Progress Note  Patient Name: Cory Bradley Date of Encounter: 08/01/2017  Primary Cardiologist: Verdis Prime  Subjective   No chest pain or dyspnea.   Inpatient Medications    Scheduled Meds: . acetaminophen  1,000 mg Oral Q6H   Or  . acetaminophen (TYLENOL) oral liquid 160 mg/5 mL  1,000 mg Per Tube Q6H  . acetaminophen (TYLENOL) oral liquid 160 mg/5 mL  650 mg Per Tube Once   Or  . acetaminophen  650 mg Rectal Once  . aspirin EC  325 mg Oral Daily   Or  . aspirin  324 mg Per Tube Daily  . atorvastatin  80 mg Oral Daily  . Chlorhexidine Gluconate Cloth  6 each Topical Daily  . donepezil  5 mg Oral QHS  . insulin aspart  0-24 Units Subcutaneous Q4H  . memantine  10 mg Oral BID  . niacin  1,000 mg Oral QHS  . pantoprazole  40 mg Oral Daily  . sodium chloride flush  3 mL Intravenous Q12H   Continuous Infusions: . sodium chloride    . sodium chloride 250 mL (08/01/17 0531)  . albumin human    . lactated ringers    . levofloxacin (LEVAQUIN) IV    . nitroGLYCERIN    . phenylephrine (NEO-SYNEPHRINE) Adult infusion 0 mcg/min (07/31/17 1800)   PRN Meds: sodium chloride, albumin human, lactated ringers, morphine injection, ondansetron (ZOFRAN) IV, oxyCODONE, sodium chloride flush, sodium chloride flush, traMADol   Vital Signs    Vitals:   08/01/17 0300 08/01/17 0339 08/01/17 0400 08/01/17 0500  BP: 112/61  118/60 123/90  Pulse: (!) 58  64 (!) 35  Resp: 15  12 17   Temp:  98.2 F (36.8 C)    TempSrc:  Oral    SpO2: 96%  98% 98%  Weight:  165 lb 11.2 oz (75.2 kg)    Height:        Intake/Output Summary (Last 24 hours) at 08/01/17 0712 Last data filed at 08/01/17 0630  Gross per 24 hour  Intake           2326.3 ml  Output             1020 ml  Net           1306.3 ml   Filed Weights   07/31/17 0857 08/01/17 0339  Weight: 166 lb (75.3 kg) 165 lb 11.2 oz (75.2 kg)    Telemetry    sinus with PVCs- Personally Reviewed  Physical Exam   GEN: No acute  distress.   Neck: No JVD Cardiac: RRR with ectopy, no murmurs, rubs, or gallops.  Respiratory: Clear to auscultation bilaterally. GI: Soft, nontender, non-distended  Ext: No edema; No deformity. Bilateral groin sites without hematoma Neuro:  Nonfocal  Psych: Normal affect   Labs    Chemistry Recent Labs Lab 07/26/17 1407  07/31/17 1151 07/31/17 1219 07/31/17 1240 08/01/17 0445  NA 138  < > 143 144 141 137  K 4.4  < > 4.5 3.9 4.5 4.2  CL 107  < > 105 110  --  108  CO2 22  --   --   --   --  22  GLUCOSE 126*  < > 113* 114* 127* 113*  BUN 31*  < > 14 12  --  15  CREATININE 1.31*  < > 0.90 0.70  --  1.11  CALCIUM 8.7*  --   --   --   --  8.6*  PROT  6.2*  --   --   --   --   --   ALBUMIN 3.7  --   --   --   --   --   AST 20  --   --   --   --   --   ALT 15*  --   --   --   --   --   ALKPHOS 43  --   --   --   --   --   BILITOT 0.9  --   --   --   --   --   GFRNONAA 49*  --   --   --   --  60*  GFRAA 57*  --   --   --   --  >60  ANIONGAP 9  --   --   --   --  7  < > = values in this interval not displayed.   Hematology Recent Labs Lab 07/26/17 1407  07/31/17 1219 07/31/17 1238 07/31/17 1240  WBC 8.2  --   --  8.4  --   RBC 4.35  --   --  3.84*  --   HGB 13.0  < > 8.8* 11.2* 10.9*  HCT 38.9*  < > 26.0* 34.0* 32.0*  MCV 89.4  --   --  88.5  --   MCH 29.9  --   --  29.2  --   MCHC 33.4  --   --  32.9  --   RDW 13.8  --   --  13.7  --   PLT 140*  --   --  116*  --   < > = values in this interval not displayed.    Radiology    Dg Chest Port 1 View  Result Date: 07/31/2017 CLINICAL DATA:  Postop transcatheter aortic valve replacement. EXAM: PORTABLE CHEST 1 VIEW COMPARISON:  07/26/2017 radiographs. FINDINGS: 1412 hours. Stable cardiomegaly status post median sternotomy and CABG. New TAVR appears satisfactorily positioned. There is a right IJ central venous catheter with its tip at the lower SVC level. The lungs are clear. There is no pleural effusion or pneumothorax.  IMPRESSION: No demonstrated complication following transcatheter aortic valve replacement. Electronically Signed   By: Carey BullocksWilliam  Veazey M.D.   On: 07/31/2017 14:40    Cardiac Studies   Echo 08/01/17: Left ventricle: The cavity size was normal. There was mild   concentric hypertrophy. Systolic function was normal. The   estimated ejection fraction was in the range of 55% to 60%. Wall   motion was normal; there were no regional wall motion   abnormalities. Features are consistent with a pseudonormal left   ventricular filling pattern, with concomitant abnormal relaxation   and increased filling pressure (grade 2 diastolic dysfunction). - Aortic valve: A stent-valve (TAVR) bioprosthesis was present and   functioning normally. Valve area (VTI): 1.42 cm^2. Valve area   (Vmax): 1.49 cm^2. Valve area (Vmean): 1.34 cm^2. - Mitral valve: There was mild to moderate regurgitation directed   centrally. - Left atrium: The atrium was severely dilated. - Right ventricle: The cavity size was mildly dilated. Systolic   function was mildly reduced. - Right atrium: The atrium was severely dilated. - Atrial septum: No defect or patent foramen ovale was identified.  Patient Profile     83 y.o. male with severe aortic valve stenosis who underwent TAVR on 07/31/17 from the right transfemoral approach.   Assessment & Plan  1. Severe aortic valve stenosis: He is now one day post TAVR from the right transfemoral approach with placement of a 26 mm Edwards Sapien 3 valve. He is doing well this am. Echo shows normally functioning bioprosthetic aortic valve replacement. Will continue ASA and start Plavix this am. Start low dose beta blocker this am.  D/c arterial line, foley and central line. Transfer to tele.   Signed, Verne Carrow, MD  08/01/2017, 7:12 AM

## 2017-08-02 ENCOUNTER — Other Ambulatory Visit: Payer: Self-pay | Admitting: *Deleted

## 2017-08-02 DIAGNOSIS — I350 Nonrheumatic aortic (valve) stenosis: Secondary | ICD-10-CM

## 2017-08-02 LAB — GLUCOSE, CAPILLARY: GLUCOSE-CAPILLARY: 106 mg/dL — AB (ref 65–99)

## 2017-08-02 MED ORDER — CLOPIDOGREL BISULFATE 75 MG PO TABS: 75.0000 mg | ORAL_TABLET | Freq: Every day | ORAL | 1 refills | Status: DC

## 2017-08-02 NOTE — Progress Notes (Signed)
2 Days Post-Op Procedure(s) (LRB): TRANSCATHETER AORTIC VALVE REPLACEMENT, TRANSFEMORAL (N/A) TRANSESOPHAGEAL ECHOCARDIOGRAM (TEE) (N/A) Subjective:  No complaints. Ambulating well without chest pain or shortness of breath.  Objective: Vital signs in last 24 hours: Temp:  [97.7 F (36.5 C)-98.2 F (36.8 C)] 97.7 F (36.5 C) (08/16 0334) Pulse Rate:  [41-83] 83 (08/16 0334) Cardiac Rhythm: Normal sinus rhythm (08/15 1900) Resp:  [12-20] 16 (08/16 0334) BP: (120-167)/(50-93) 152/92 (08/16 0334) SpO2:  [99 %-100 %] 99 % (08/16 0334) Weight:  [75.8 kg (167 lb)] 75.8 kg (167 lb) (08/16 0334)  Hemodynamic parameters for last 24 hours:    Intake/Output from previous day: 08/15 0701 - 08/16 0700 In: 590 [P.O.:580; I.V.:10] Out: 1250 [Urine:1250] Intake/Output this shift: Total I/O In: -  Out: 300 [Urine:300]  General appearance: alert and cooperative Neurologic: intact Heart: regular rate and rhythm, S1, S2 normal, no murmur, click, rub or gallop Lungs: clear to auscultation bilaterally Extremities: extremities normal, atraumatic, no cyanosis or edema Wound: groin access sites ok  Lab Results:  Recent Labs  07/31/17 1238 07/31/17 1240 08/01/17 0445  WBC 8.4  --  16.5*  HGB 11.2* 10.9* 12.0*  HCT 34.0* 32.0* 35.2*  PLT 116*  --  117*   BMET:  Recent Labs  07/31/17 1219 07/31/17 1240 08/01/17 0445  NA 144 141 137  K 3.9 4.5 4.2  CL 110  --  108  CO2  --   --  22  GLUCOSE 114* 127* 113*  BUN 12  --  15  CREATININE 0.70  --  1.11  CALCIUM  --   --  8.6*    PT/INR: No results for input(s): LABPROT, INR in the last 72 hours. ABG    Component Value Date/Time   PHART 7.323 (L) 07/31/2017 1245   HCO3 26.0 07/31/2017 1245   TCO2 27 07/31/2017 1245   ACIDBASEDEF 1.0 07/31/2017 1245   O2SAT 94.0 07/31/2017 1245   CBG (last 3)   Recent Labs  08/01/17 1537 08/01/17 2119 08/02/17 0610  GLUCAP 120* 126* 106*    Assessment/Plan: S/P Procedure(s)  (LRB): TRANSCATHETER AORTIC VALVE REPLACEMENT, TRANSFEMORAL (N/A) TRANSESOPHAGEAL ECHOCARDIOGRAM (TEE) (N/A)  He is doing well. Plan home today on ASA and Plavix.  Groin check in one week and follow up appt with Dr. Clifton JamesMcAlhany with echo in one month.   LOS: 2 days    Alleen BorneBryan K Alante Weimann 08/02/2017

## 2017-08-02 NOTE — Discharge Summary (Signed)
Physician Discharge Summary  Patient ID: Cory Bradley MRN: [id number] DOB/AGE: 81/08/1933 81 y.o.  Admit date: 07/31/2017 Discharge date: 08/02/2017  Admission Diagnoses:Severe Aortic stenosis  Discharge Diagnoses:  Active Problems:   Status post transcatheter aortic valve replacement (TAVR) using bioprosthesis  Patient Active Problem List   Diagnosis Date Noted  . 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 Dyspnea on exertion 05/26/2017  . CAD (coronary artery disease) 05/16/2017  . SVT (supraventricular tachycardia) (HCC) 05/16/2017  . Carotid artery disease (HCC) 05/16/2017  . Aortic stenosis   . CKD (chronic kidney disease), stage II   . Cigarette smoker   . Essential hypertension   . Hyperlipidemia     HPI:  The patient is an 83 year old gentleman with a history of DM, hypertension, hyperlipidemia, chronic kidney disease, aortic stenosis and CAD s/p acute inferior STEMI with PCI of the RCA followed by CABG x 4 in 2005. He has a several month history of progressive shortness of breath with exertion and an echo on 05/25/2017 showed severe AS with a mean gradient of 40 mm Hg with an EF of 55-60%. His previous echo in 09/2016 showed a mean gradient of 32 mm Hg. He underwent cath on 05/28/2017 which showed patent grafts to the PDA, diagonal, and OM with a patent LIMA to the LAD. There was severe native vessel CAD. The peak to peak gradient was 35 mm Hg and calculated AVA 1.27 cm2. He was referred for pulmonary evaluation which was done by Dr. Sherene SiresWert and PFT's were normal.  He has had SOB with exertion for at least 6 months. His wife died in 02/2017 of pancreatic cancer and he really did not think about the symptoms prior to that because he was so busy with her. He has been fatigued and has no energy. He denies orthopnea, dizziness, peripheral edema.   He was admitted for elective TAVR  Discharged Condition: good  Hospital Course: The  patient was admitted and on 08/01/2007 and he was taken to the operating room where he underwent the below described procedure. He tolerated well was taken to the surgical intensive care unit in stable condition.  Postoperative hospital course: The patient has done quite well postoperatively. He has maintained stable hemodynamics. His postoperative echocardiogram shows normally functioning aortic valve replacement. He is being started on both aspirin and Plavix. He does have a postoperative leukocytosis with white blood cell count of 16 point 5. He is having no fevers or any clinical symptoms and this is felt to be most likely a stress response. He is tolerating routine activities commensurate for level of postoperative, lessens. He has been seen by both cardiology and the surgeon and is felt to be quite stable for discharge on today's date.   Consults: cardiology  Significant Diagnostic Studies: echocardiogram  Treatments: surgery:   TAVR OPERATIVE NOTE   Date of Procedure:                07/31/2017  Preoperative Diagnosis:      Severe Aortic Stenosis   Postoperative Diagnosis:    Same   Procedure:        Transcatheter Aortic Valve Replacement - Percutaneous Right Transfemoral Approach             Edwards Sapien 3 THV (size 26 mm, model # 9600TFX, serial # [id number])              Co-Surgeons:  Alleen Borne, MD and Verne Carrow, MD   Anesthesiologist:                  Marcene Duos, MD  Echocardiographer:              Charlton Haws, MD  Pre-operative Echo Findings: ? Severe aortic stenosis ? Normal left ventricular systolic function  Post-operative Echo Findings: ? no paravalvular leak ? Normal left ventricular systolic function Discharge Exam: Blood pressure (!) 152/92, pulse 83, temperature 97.7 F (36.5 C), temperature source Oral, resp. rate 16, height 6\' 2"  (1.88 m), weight 167 lb (75.8 kg), SpO2 99 %.  General appearance:  alert and cooperative Neurologic: intact Heart: regular rate and rhythm, S1, S2 normal, no murmur, click, rub or gallop Lungs: clear to auscultation bilaterally Extremities: extremities normal, atraumatic, no cyanosis or edema Wound: groin access sites ok Disposition: 01-Home or Self Care   Allergies as of 08/02/2017      Reactions   Penicillins Swelling   SWELLING, HANDS and FEET Has patient had a PCN reaction causing immediate rash, facial/tongue/throat swelling, SOB or lightheadedness with hypotension: No Has patient had a PCN reaction causing severe rash involving mucus membranes or skin necrosis: No Has patient had a PCN reaction that required hospitalization: No Has patient had a PCN reaction occurring within the last 10 years: No   Sulfa Antibiotics Itching      Medication List    STOP taking these medications   furosemide 20 MG tablet Commonly known as:  LASIX   naproxen sodium 550 MG tablet Commonly known as:  ANAPROX   potassium chloride 10 MEQ tablet Commonly known as:  K-DUR     TAKE these medications   aspirin EC 81 MG tablet Take 1 tablet (81 mg total) by mouth daily.   atorvastatin 80 MG tablet Commonly known as:  LIPITOR Take 80 mg by mouth daily.   clopidogrel 75 MG tablet Commonly known as:  PLAVIX Take 1 tablet (75 mg total) by mouth daily.   diphenhydrAMINE 25 MG tablet Commonly known as:  BENADRYL Take 25 mg by mouth at bedtime.   donepezil 5 MG tablet Commonly known as:  ARICEPT Take 5 mg by mouth at bedtime.   Fish Oil 1000 MG Caps Take 1,000 mg by mouth 2 (two) times daily.   irbesartan 150 MG tablet Commonly known as:  AVAPRO Take 1 tablet (150 mg total) by mouth daily.   memantine 10 MG tablet Commonly known as:  NAMENDA Take 10 mg by mouth 2 (two) times daily.   metFORMIN 500 MG tablet Commonly known as:  GLUCOPHAGE TAKE 1 TABLET (500 MG TOTAL) BY MOUTH AT BEDTIME   niacin 1000 MG CR tablet Commonly known as:   NIASPAN Take 1,000 mg by mouth at bedtime.   omeprazole 40 MG capsule Commonly known as:  PRILOSEC Take 40 mg by mouth daily.   propranolol ER 120 MG 24 hr capsule Commonly known as:  INDERAL LA Take 120 mg by mouth daily.   PROSTATE Tabs Take 1 tablet by mouth daily.   temazepam 15 MG capsule Commonly known as:  RESTORIL Take 15 mg by mouth at bedtime.        SignedGershon Crane E 08/02/2017, 9:23 AM

## 2017-08-02 NOTE — Progress Notes (Signed)
Pt being discharged home via wheelchair with family. Pt alert and oriented x4. VSS. Pt c/o no pain at this time. No signs of respiratory distress. Education complete and care plans resolved. IV removed with catheter intact and pt tolerated well. No further issues at this time. Pt to follow up with PCP. Stefanos Haynesworth R, RN 

## 2017-08-02 NOTE — Progress Notes (Signed)
CARDIAC REHAB PHASE I   Pt in bed, very eager for discharge, states he walked twice yesterday around the ICU, states he has been up ad lib in the room this morning, adamantly declines ambulation at this time. Completed cardiac surgery discharge education. Reviewed IS, activity progression, restrictions, exercise, heart healthy and diabetes diet handouts, daily weights and phase 2 cardiac rehab. Pt verbalized understanding. Pt declines phase 2 cardiac rehab referral, states he will exercise on his own. Pt in bed, call bell within reach.   [id number] Joylene GrapesEmily C Katherine Syme, RN, BSN 08/02/2017 10:17 AM

## 2017-08-02 NOTE — Progress Notes (Signed)
Progress Note  Patient Name: Cory Bradley Date of Encounter: 08/02/2017  Primary Cardiologist: Verdis Prime  Subjective   No chest pain or dyspnea.   Inpatient Medications    Scheduled Meds: . aspirin EC  81 mg Oral Daily  . atorvastatin  80 mg Oral Daily  . clopidogrel  75 mg Oral Daily  . donepezil  5 mg Oral QHS  . insulin aspart  0-24 Units Subcutaneous TID AC & HS  . irbesartan  150 mg Oral Daily  . memantine  10 mg Oral BID  . niacin  1,000 mg Oral QHS  . propranolol ER  120 mg Oral Daily  . sodium chloride flush  3 mL Intravenous Q12H   Continuous Infusions: . sodium chloride     PRN Meds: sodium chloride, sodium chloride flush   Vital Signs    Vitals:   08/01/17 1545 08/01/17 1614 08/01/17 2007 08/02/17 0334  BP: (!) 144/93 (!) 160/73 (!) 167/53 (!) 152/92  Pulse: 78  (!) 41 83  Resp: 20  15 16   Temp: 98.1 F (36.7 C) 98 F (36.7 C) 97.8 F (36.6 C) 97.7 F (36.5 C)  TempSrc: Oral Oral Oral Oral  SpO2: 99%  99% 99%  Weight:    167 lb (75.8 kg)  Height:        Intake/Output Summary (Last 24 hours) at 08/02/17 0656 Last data filed at 08/02/17 0339  Gross per 24 hour  Intake           604.83 ml  Output             1250 ml  Net          -645.17 ml   Filed Weights   07/31/17 0857 08/01/17 0339 08/02/17 0334  Weight: 166 lb (75.3 kg) 165 lb 11.2 oz (75.2 kg) 167 lb (75.8 kg)    Telemetry    Sinus with pvcs, bigeminy- Personally Reviewed  Physical Exam    General: Well developed, well nourished, NAD  HEENT: OP clear, mucus membranes moist  SKIN: warm, dry. No rashes. Neuro: No focal deficits  Musculoskeletal: Muscle strength 5/5 all ext  Psychiatric: Mood and affect normal  Neck: No JVD, no carotid bruits, no thyromegaly, no lymphadenopathy.  Lungs:Clear bilaterally, no wheezes, rhonci, crackles Cardiovascular: Regular rate and rhythm with ectopy. No murmurs, gallops or rubs. Abdomen:Soft. Bowel sounds present. Non-tender.    Extremities: No lower extremity edema. Pulses are 2 + in the bilateral DP/PT. Bilateral groin without hematoma    Labs    Chemistry Recent Labs Lab 07/26/17 1407  07/31/17 1151 07/31/17 1219 07/31/17 1240 08/01/17 0445  NA 138  < > 143 144 141 137  K 4.4  < > 4.5 3.9 4.5 4.2  CL 107  < > 105 110  --  108  CO2 22  --   --   --   --  22  GLUCOSE 126*  < > 113* 114* 127* 113*  BUN 31*  < > 14 12  --  15  CREATININE 1.31*  < > 0.90 0.70  --  1.11  CALCIUM 8.7*  --   --   --   --  8.6*  PROT 6.2*  --   --   --   --   --   ALBUMIN 3.7  --   --   --   --   --   AST 20  --   --   --   --   --  ALT 15*  --   --   --   --   --   ALKPHOS 43  --   --   --   --   --   BILITOT 0.9  --   --   --   --   --   GFRNONAA 49*  --   --   --   --  60*  GFRAA 57*  --   --   --   --  >60  ANIONGAP 9  --   --   --   --  7  < > = values in this interval not displayed.   Hematology Recent Labs Lab 07/26/17 1407  07/31/17 1238 07/31/17 1240 08/01/17 0445  WBC 8.2  --  8.4  --  16.5*  RBC 4.35  --  3.84*  --  4.03*  HGB 13.0  < > 11.2* 10.9* 12.0*  HCT 38.9*  < > 34.0* 32.0* 35.2*  MCV 89.4  --  88.5  --  87.3  MCH 29.9  --  29.2  --  29.8  MCHC 33.4  --  32.9  --  34.1  RDW 13.8  --  13.7  --  13.5  PLT 140*  --  116*  --  117*  < > = values in this interval not displayed.    Radiology    Dg Chest Port 1 View  Result Date: 07/31/2017 CLINICAL DATA:  Postop transcatheter aortic valve replacement. EXAM: PORTABLE CHEST 1 VIEW COMPARISON:  07/26/2017 radiographs. FINDINGS: 1412 hours. Stable cardiomegaly status post median sternotomy and CABG. New TAVR appears satisfactorily positioned. There is a right IJ central venous catheter with its tip at the lower SVC level. The lungs are clear. There is no pleural effusion or pneumothorax. IMPRESSION: No demonstrated complication following transcatheter aortic valve replacement. Electronically Signed   By: Carey BullocksWilliam  Veazey M.D.   On: 07/31/2017  14:40    Cardiac Studies   Echo 08/01/17: Left ventricle: The cavity size was normal. There was mild   concentric hypertrophy. Systolic function was normal. The   estimated ejection fraction was in the range of 55% to 60%. Wall   motion was normal; there were no regional wall motion   abnormalities. Features are consistent with a pseudonormal left   ventricular filling pattern, with concomitant abnormal relaxation   and increased filling pressure (grade 2 diastolic dysfunction). - Aortic valve: A stent-valve (TAVR) bioprosthesis was present and   functioning normally. Valve area (VTI): 1.42 cm^2. Valve area   (Vmax): 1.49 cm^2. Valve area (Vmean): 1.34 cm^2. - Mitral valve: There was mild to moderate regurgitation directed   centrally. - Left atrium: The atrium was severely dilated. - Right ventricle: The cavity size was mildly dilated. Systolic   function was mildly reduced. - Right atrium: The atrium was severely dilated. - Atrial septum: No defect or patent foramen ovale was identified.  Patient Profile     83 y.o. male with severe aortic valve stenosis who underwent TAVR on 07/31/17 from the right transfemoral approach.   Assessment & Plan    1. Severe aortic valve stenosis: He is two days post TAVR and doing well. Echo with normally functioning AVR. Continue ASA and Plavix.  WBC elevated but no dysuria, cough or fevers. Likely stress response.  He can be discharged home today. Follow up one week with office APP and then one month with me with echo [facility]  Signed, Verne Carrow, MD  08/02/2017, 6:56 AM

## 2017-08-02 NOTE — Discharge Instructions (Signed)
ACTIVITY AND EXERCISE °• Daily activity and exercise are an important part °of your recovery. People recover at different rates °depending on their general health and type of °valve procedure. °• Most people require six to 10 weeks to feel °recovered. °• No lifting, pushing, pulling more than 10 pounds °(examples to avoid: groceries, vacuuming, °gardening, golfing): °- For one week with a procedure through the groin. °- For six weeks for procedures through the chest °wall. °- For three months for procedures through the °breast-bone. °• After the initial healing process of the access site, °we recommend cardiac rehabilitation for all TAVR °patients. Cardiac rehabilitation will help you: °- Rebuild stamina, strength and balance. °- Learn how to participate in activities safely, as well °as help you regain confidence to do so. °- Return to activities of daily living and leisure. °• Discuss attending cardiac rehabilitation at your °follow-up appointment  ° °DRIVING °• Do not drive for four weeks after the date of your °procedure. °• If you have been told by your doctor in the past °that you may not drive, you must talk with him/her °before you begin driving again. °• When you resume driving, you must have someone °with you. ° °HYGIENE °If you had a femoral (leg) procedure, you may take a shower when you return home. After the shower, pat the °site dry. Do NOT use powder, oils or lotions in your groin area until the site has completely healed. °• If you had a chest procedure, you may shower when you return home unless specifically instructed not to by °your discharging practitioner. °- DO NOT scrub incision; pat dry with a towel °- DO NOT apply any lotions, oils, powders to the incision °- No tub baths / swimming for at least six weeks. ° °SITE CARE °• You likely will have small openings in both groins °from catheters used during the procedure. If you °had a transfemoral procedure, one groin will have a °larger opening  and may be bruised or tender. °• If you had a chest procedure, you will have either a °small incision in your upper sternum (breast-bone) °or between your ribs on your left side. °- Chest wall site: The surgical incision should be °kept dry (no lotions / oils / powders) and open °to air. If you experience irritation from clothing °rubbing on the incision, a light gauze dressing °may be applied. °- Inspect your incision daily; notify your °physician if there is increased redness, swelling °or drainage from the incision. °- If the incision is located on your breast-bone °you must avoid lifting objects heavier than a °gallon of milk (eight pounds) and stretching / °twisting / pulling with your arms for at least °three months to ensure strong bone healing. ° ° °CONTACT [phone] °• Check your sites daily. Contact our office if you have °any of the following problems: °- Redness and warmth that does not go away °- Yellow or green drainage from the wound °- Fever and chills °- Increasing numbness in your legs °- Worsening pain at the site °• If you had a leg/groin procedure, it is normal to °have bruising or a soft lump at the site. It is not °normal if the lump suddenly becomes larger or °more firm. This may mean you are bleeding. If this °happens: °- Lie down °- Have someone press down hard, just above °the hole in your skin where the procedure was °performed for 15 minutes. If after holding on the °site, the lump does not become larger or harder, °they   are performing this correctly. °- If the bleeding has stopped after 15 minutes, rest °and stay laying down for at least two hours. °- If the bleeding continues, call 911 for an °ambulance. Do NOT drive yourself or have °someone else drive you. °

## 2017-08-02 NOTE — Care Management Note (Signed)
Case Management Note Original Note Created Leone Havenaylor, Deborah Clinton, RN 08/01/2017, 12:40 PM  Patient Details  Name: Cory GroutCharles E Bethel MRN: [id number] Date of Birth: 12/15/1933  Subjective/Objective:  From home, daughter is support person,  POD 1 TAVR,  bp elevated will ambulate and transfer to SDU plan for dc tomorrow with daughter.                   Action/Plan: NCM will follow for dc needs.  Expected Discharge Date:  08/02/17               Expected Discharge Plan:  Home/Self Care  In-House Referral:  NA  Discharge planning Services  CM Consult  Post Acute Care Choice:  NA Choice offered to:  NA  DME Arranged:    DME Agency:     HH Arranged:    HH Agency:     Status of Service:  Completed, signed off  If discussed at [employer] Length of Stay Meetings, dates discussed:    Discharge Disposition: home/self care   Additional Comments:  08/02/17- 1130- Donn PieriniKristi Bayyinah Dukeman RN, CM- pt for d/c home today with daughter- no CM needs noted for discharge.   Zenda AlpersWebster, SummerfieldKristi Hall, RN 08/02/2017, 11:32 AM [phone]

## 2017-08-10 ENCOUNTER — Encounter: Payer: Self-pay | Admitting: Cardiology

## 2017-08-10 ENCOUNTER — Encounter (INDEPENDENT_AMBULATORY_CARE_PROVIDER_SITE_OTHER): Payer: Self-pay

## 2017-08-10 ENCOUNTER — Ambulatory Visit (INDEPENDENT_AMBULATORY_CARE_PROVIDER_SITE_OTHER): Payer: Medicare Other | Admitting: Cardiology

## 2017-08-10 VITALS — BP 136/70 | HR 64 | Ht 74.0 in | Wt 169.1 lb

## 2017-08-10 DIAGNOSIS — Z953 Presence of xenogenic heart valve: Secondary | ICD-10-CM | POA: Diagnosis not present

## 2017-08-10 MED ORDER — NIACIN ER (ANTIHYPERLIPIDEMIC) 1000 MG PO TBCR: 1000.0000 mg | EXTENDED_RELEASE_TABLET | Freq: Every day | ORAL | 1 refills | Status: AC

## 2017-08-10 NOTE — Progress Notes (Signed)
08/10/2017 Cory Bradley   03-Jan-1933  [id number]  Primary Physician Bernerd Limbo, MD Primary Cardiologist: Dr. Richardean Sale Clinic: Dr. Angelena Form Electrophysiologist: Pending Consult with Dr. Curt Bears for PVCs  Reason for Visit/CC: [facility] f/u s/p TAVR for AS  HPI:  83 year old gentleman with a history of DM, hypertension, hyperlipidemia, chronic kidney disease, aortic stenosis and CAD s/p acute inferior STEMI with PCI of the RCA followed by CABG x 4 in 2005. He underwent repeat cath on 05/28/2017 which showed patent grafts to the PDA, diagonal, and OM with a patent LIMA to the LAD. EF normal at 55-60%, by echo, however his AS had progressed to severe stage with mean gradient of 40 mm Hg.   He was admitted 07/31/17 for TAVR given severe symptomatic aortic stenosis. Procedure was performed by Dr. Angelena Form and Dr. Cyndia Bent. He tolerated the procedure well. Post procedural echo showed normal functioning AVR. He was placed on DAPT with ASA and Plavix.  He presents to clinic today for 1 week post hospital f/u. He has his 1 month f/u echo an office appt arranged with Dr. Angelena Form on 09/03/17. He reports that he has done well. He denies any exertional chest pain or dyspnea. His femoral access sites are stable without complication. He reports full medication compliance with aspirin and Plavix. No abnormal bleeding. Vital signs are stable. Heart rate 64 bpm. EKG was sinus rhythm and frequent PVCs. Blood pressure is well-controlled at 136/70.  Current Meds  Medication Sig  . aspirin EC 81 MG tablet Take 1 tablet (81 mg total) by mouth daily.  [name] atorvastatin (LIPITOR) 80 MG tablet Take 80 mg by mouth daily.  . clopidogrel (PLAVIX) 75 MG tablet Take 1 tablet (75 mg total) by mouth daily.  . diphenhydrAMINE (BENADRYL) 25 MG tablet Take 25 mg by mouth at bedtime.   . donepezil (ARICEPT) 5 MG tablet Take 5 mg by mouth at bedtime.  . irbesartan (AVAPRO) 150 MG tablet Take 1 tablet (150 mg total) by mouth  daily.  . memantine (NAMENDA) 10 MG tablet Take 10 mg by mouth 2 (two) times daily.  . metFORMIN (GLUCOPHAGE) 500 MG tablet TAKE 1 TABLET (500 MG TOTAL) BY MOUTH AT BEDTIME  . niacin (NIASPAN) 1000 MG CR tablet Take 1 tablet (1,000 mg total) by mouth at bedtime.  . Omega-3 Fatty Acids (FISH OIL) 1000 MG CAPS Take 1,000 mg by mouth 2 (two) times daily.  [name] omeprazole (PRILOSEC) 40 MG capsule Take 40 mg by mouth daily.  . propranolol ER (INDERAL LA) 120 MG 24 hr capsule Take 120 mg by mouth daily.  [name] Specialty Vitamins Products (PROSTATE) TABS Take 1 tablet by mouth daily.  . temazepam (RESTORIL) 15 MG capsule Take 15 mg by mouth at bedtime.   . [DISCONTINUED] niacin (NIASPAN) 1000 MG CR tablet Take 1,000 mg by mouth at bedtime.   Allergies  Allergen Reactions  . Penicillins Swelling    SWELLING, HANDS and FEET  Has patient had a PCN reaction causing immediate rash, facial/tongue/throat swelling, SOB or lightheadedness with hypotension: No Has patient had a PCN reaction causing severe rash involving mucus membranes or skin necrosis: No Has patient had a PCN reaction that required hospitalization: No Has patient had a PCN reaction occurring within the last 10 years: No  . Sulfa Antibiotics Itching   Past Medical History:  Diagnosis Date  . AAA (abdominal aortic aneurysm) (HCC)    3.3 cm infrarenal AAA 06/2017 CTA; 3 year f/u rec.  [name]  Allergic rhinitis   . Aortic stenosis    a. Mild-mod by echo 01/2014. // b. Echo 6/18: Mod LVH EF 55-60, normal wall motion, Gr 2 DD, mod to severe AS (mean 40, peak 68; AVA by mean velocity 0.73 cm), mild AI, mod MAC, mild MR, severe LAE, mildly reduced RVSF, mod RAE  . CKD (chronic kidney disease), stage II   . Coronary artery disease    a. acute inferior MI s/p PTCA of RCA followed by CABGx4 in 2005,  . Diabetes mellitus ([place])   . ED (erectile dysfunction)   . Hyperlipidemia   . Hypertension   . Irregular heartbeat 10/2016  . Left carotid stenosis   .  Memory loss   . Osteoarthritis   . Reflux   . Tobacco abuse    Family History  Problem Relation Age of Onset  . Heart disease Father    Past Surgical History:  Procedure Laterality Date  . APPENDECTOMY    . CORONARY ARTERY BYPASS GRAFT    . ELBOW SURGERY    . RIGHT/LEFT HEART CATH AND CORONARY/GRAFT ANGIOGRAPHY N/A 05/28/2017   Procedure: Right/Left Heart Cath and Coronary/Graft Angiography;  Surgeon: Belva Crome, MD;  Location: [place] [facility];  Service: Cardiovascular;  Laterality: N/A;  . TEE WITHOUT CARDIOVERSION N/A 07/31/2017   Procedure: TRANSESOPHAGEAL ECHOCARDIOGRAM (TEE);  Surgeon: Burnell Blanks, MD;  Location: [place];  Service: Open Heart Surgery;  Laterality: N/A;  . TRANSCATHETER AORTIC VALVE REPLACEMENT, TRANSFEMORAL N/A 07/31/2017   Procedure: TRANSCATHETER AORTIC VALVE REPLACEMENT, TRANSFEMORAL;  Surgeon: Burnell Blanks, MD;  Location: [place];  Service: Open Heart Surgery;  Laterality: N/A;   Social History   Social History  . Marital status: Widowed    Spouse name: N/A  . Number of children: 3  . Years of education: N/A   Occupational History  . Professor at [employer]    Social History Main Topics  . Smoking status: Current Every Day Smoker    Packs/day: 0.50    Years: 40.00    Types: Cigarettes  . Smokeless tobacco: Never Used     Comment: stopped for now, until surgery  . Alcohol use No  . Drug use: No  . Sexual activity: Not on file   Other Topics Concern  . Not on file   Social History Narrative  . No narrative on file     Review of Systems: General: negative for chills, fever, night sweats or weight changes.  Cardiovascular: negative for chest pain, dyspnea on exertion, edema, orthopnea, palpitations, paroxysmal nocturnal dyspnea or shortness of breath Dermatological: negative for rash Respiratory: negative for cough or wheezing Urologic: negative for hematuria Abdominal: negative for nausea, vomiting, diarrhea,  bright red blood per rectum, melena, or hematemesis Neurologic: negative for visual changes, syncope, or dizziness All other systems reviewed and are otherwise negative except as noted above.   Physical Exam:  Blood pressure 136/70, pulse 64, height 6' 2"  (1.88 m), weight 169 lb 1.9 oz (76.7 kg), SpO2 98 %.  General appearance: alert, cooperative and no distress Neck: no carotid bruit and no JVD Lungs: clear to auscultation bilaterally Heart: regular rate and rhythm, S1, S2 normal, no murmur, click, rub or gallop Extremities: extremities normal, atraumatic, no cyanosis or edema Pulses: 2+ and symmetric Skin: Skin color, texture, turgor normal. No rashes or lesions Neurologic: Grossly normal  EKG SR with frequent PVCs -- personally reviewed   ASSESSMENT AND PLAN:   1. Aortic Stenosis s/p TAVR: 1 week post  op. Doing well w/o exertional CP or dyspnea. Femoral access sites are stable. VSS.   2. CAD: CABG x 4 in 2005. He underwent repeat cath on 05/28/2017 which showed patent grafts to the PDA, diagonal, and OM with a patent LIMA to the LAD. EF normal at 55-60%. No anginal symptoms. He is on ASA and Plavix and statin.   3. PVCs: EKG today shows SR with frequent PVCs. Dr. Tamala Julian is aware of PVCs. EP consult pending. He has an appt to see Dr. Curt Bears. Recent echo showed normal LVEF. He is asymptomatic. HR is controlled.    Follow-Up w/ Dr. Angelena Form as planned. 1 month f/u echo scheduled. He also has consult with Dr. Curt Bears for PVCs.   Tishia Maestre Ladoris Gene, MHS [facility] 08/10/2017 9:48 AM

## 2017-08-10 NOTE — Patient Instructions (Addendum)
Medication Instructions:  Your physician recommends that you continue on your current medications as directed. Please refer to the Current Medication list given to you today.   Labwork: None ordered  Testing/Procedures: None ordered  Follow-Up: Your physician recommends that you schedule a follow-up appointment in: Keep all scheduled follow-up appointments as planned.   Any Other Special Instructions Will Be Listed Below (If Applicable).   If you need a refill on your cardiac medications before your next appointment, please call your pharmacy.

## 2017-08-11 IMAGING — CT CT ANGIO CHEST
1 of 14 series · 8 of 38 positions shown · IV contrast (isovue)
Comparison: No priors.

CLINICAL DATA: 83-year-old male with history of severe aortic
stenosis. Preprocedural study prior to potential transcatheter
aortic valve replacement (TAVR) procedure.

EXAM:
CT ANGIOGRAPHY CHEST, ABDOMEN AND PELVIS
TECHNIQUE: Multidetector CT imaging through the chest, abdomen and pelvis was
performed using the standard protocol during bolus administration of
intravenous contrast. Multiplanar reconstructed images and MIPs were
obtained and reviewed to evaluate the vascular anatomy.
CONTRAST:  95 mL of Isovue 370.

[Series 14: ax thins · axial · 0.69mm/px · z∈[+786,+1314]mm · 8 of 680 slices shown]
[im 76/680  lung]
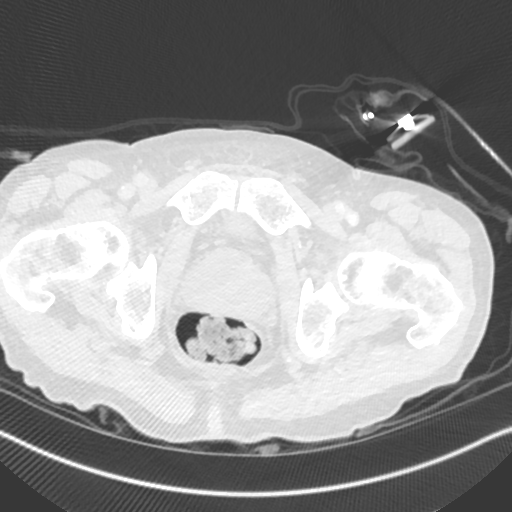
[im 151/680  mediastinal]
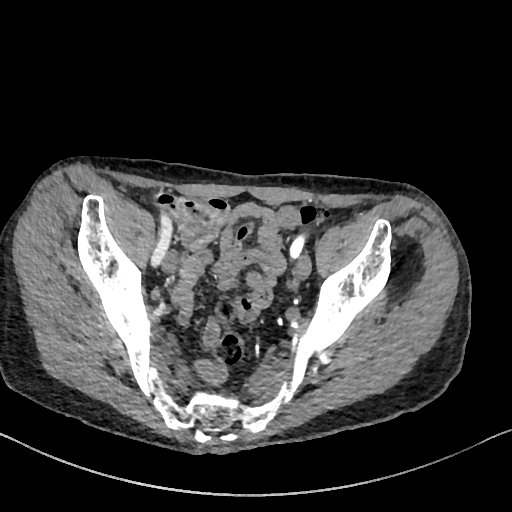
[im 227/680  lung]
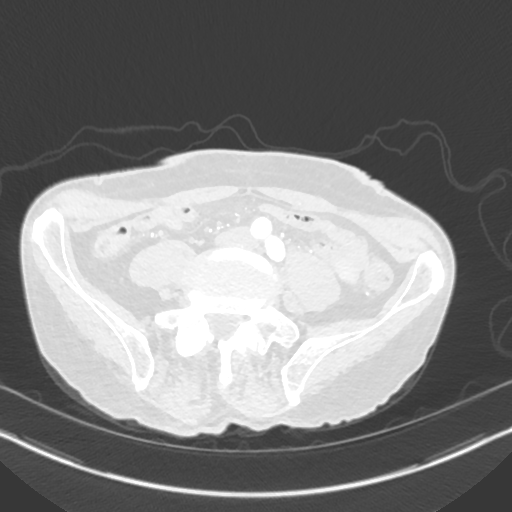
[im 302/680  mediastinal]
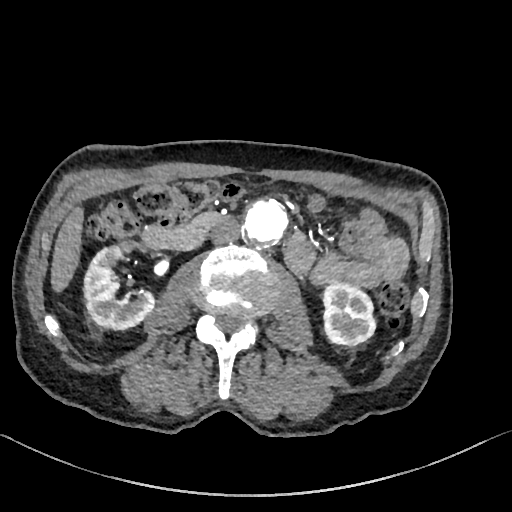
[im 378/680  lung]
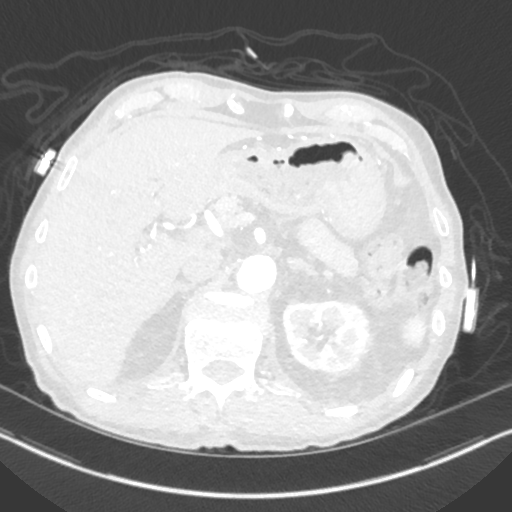
[im 453/680  mediastinal]
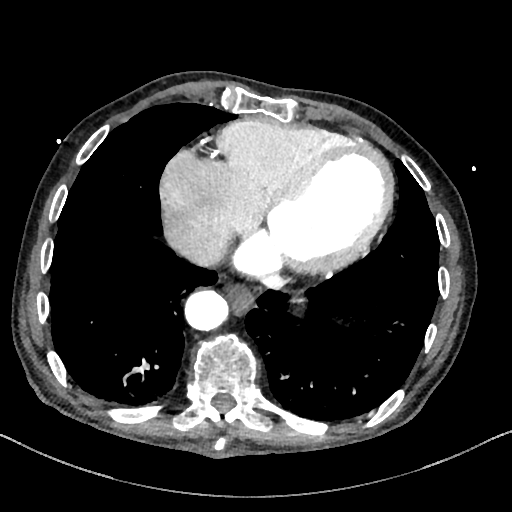
[im 529/680  lung]
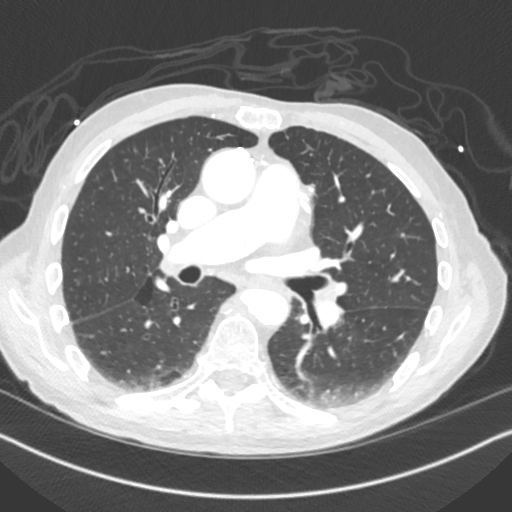
[im 604/680  mediastinal]
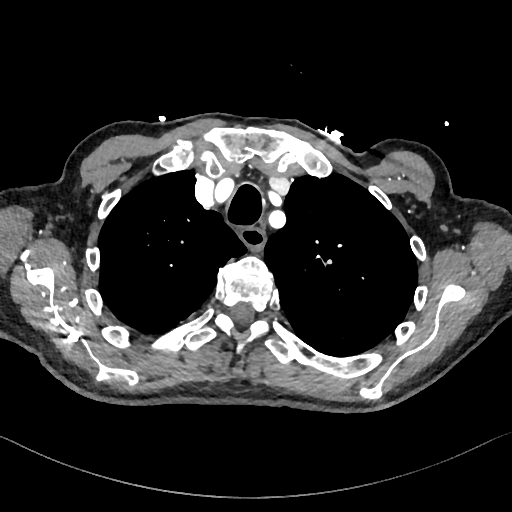

[8 of 38 positions shown; findings below may reference images not displayed]

FINDINGS: CTA CHEST FINDINGS

Cardiovascular: Heart size is enlarged. There is no significant
pericardial fluid, thickening or pericardial calcification. There is
aortic atherosclerosis, as well as atherosclerosis of the great
vessels of the mediastinum and the coronary arteries, including
calcified atherosclerotic plaque in the left main, left anterior
descending, left circumflex and right coronary arteries. Status post
median sternotomy for CABG including [REDACTED] to the LAD.

Mediastinum/Lymph Nodes: No pathologically enlarged mediastinal or
hilar lymph nodes. Esophagus is unremarkable in appearance. No
axillary lymphadenopathy.

Lungs/Pleura: No acute consolidative airspace disease. No pleural
effusions. No suspicious appearing pulmonary nodules or masses.
Scattered areas of mild scarring noted throughout the lower lobes of
the lungs bilaterally.

Musculoskeletal/Soft Tissues: Median sternotomy wires. There are no
aggressive appearing lytic or blastic lesions noted in the
visualized portions of the skeleton.

CTA ABDOMEN AND PELVIS FINDINGS

Hepatobiliary: No cystic or solid hepatic lesions. No intra or
extrahepatic biliary ductal dilatation. Small calcified gallstones
lying dependently in the gallbladder. Gallbladder is otherwise
normal in appearance.

Pancreas: No pancreatic mass. No pancreatic ductal dilatation. No
pancreatic or peripancreatic fluid or inflammatory changes.

Spleen: Unremarkable.

Adrenals/Urinary Tract: 12 mm nonobstructive calculus in the lower
pole collecting system of the right kidney. 6 mm nonobstructive
calculus in the lower pole collecting system of the left kidney.
Extensive cortical thinning in the lower pole of the right kidney.
Several other scattered areas of mild cortical thinning noted
throughout the kidneys bilaterally. 1.3 cm exophytic lesion in the
upper pole of the right kidney is low-attenuation and does not
enhance, compatible with a simple cysts. No aggressive appearing
renal lesions are noted. No hydroureteronephrosis. In the left side
of the urinary bladder at or adjacent to the left ureterovesicular
junction there is a 1.9 x 1.3 x 1.2 cm calculus (axial image 195 of
series [DATE] cm diverticulum extending from the lateral aspect of
the right side of the urinary bladder.

Stomach/Bowel: The appearance of the stomach is normal. There is no
pathologic dilatation of small bowel or colon. Numerous colonic
diverticulae are noted, most pronounced in the descending colon and
sigmoid colon, without surrounding inflammatory changes to suggest
an acute diverticulitis at this time. Normal appendix.

Vascular/Lymphatic: Aortic atherosclerosis with vascular findings
and measurements pertinent to potential TAVR procedure, as detailed
below. Fusiform aneurysmal dilatation of the infrarenal abdominal
aorta which measures up to 3.2 x 3.3 cm. Celiac axis, superior
mesenteric artery and inferior mesenteric artery are all widely
patent without hemodynamically significant stenosis. Two right renal
arteries are noted, the larger and more inferior of which
demonstrates mild stenosis at the ostium. Single left renal artery
noted with mild proximal stenosis. Short-segment dissections in the
proximal common iliac arteries bilaterally. No lymphadenopathy noted
in the abdomen or pelvis.

Reproductive: Prostate gland and seminal vesicles are unremarkable
in appearance.

Other: No significant volume of ascites.  No pneumoperitoneum.

Musculoskeletal: There are no aggressive appearing lytic or blastic
lesions noted in the visualized portions of the skeleton.

VASCULAR MEASUREMENTS PERTINENT TO TAVR:

Comment: Suboptimal contrast bolus in distal right external iliac
artery and right common femoral artery slightly limits assessment.

AORTA:

Minimal Aortic Diameter -  14 x 13 mm

Severity of Aortic Calcification -  severe

RIGHT PELVIS:

Right Common Iliac Artery -

Minimal Diameter - 9.4 x 8.0 mm

Tortuosity - moderate

Calcification - moderate

Comment - Ectasia (1.4 cm in diameter) and short segment dissection
of the proximal right common iliac artery.

Right External Iliac Artery -

Minimal Diameter - 7.4 x 7.1 mm

Tortuosity - severe

Calcification - none

Right Common Femoral Artery -

Minimal Diameter - 9.6 x 8.9 mm

Tortuosity - mild

Calcification - mild

LEFT PELVIS:

Left Common Iliac Artery -

Minimal Diameter - 10.2 x 8.1 mm

Tortuosity - mild

Calcification - moderate

Comment - Short segment dissection of the proximal right common
iliac artery.

Left External Iliac Artery -

Minimal Diameter - 7.3 x 7.1 mm

Tortuosity - severe

Calcification - none

Left Common Femoral Artery -

Minimal Diameter - 7.1 x 8.3 mm

Tortuosity - mild

Calcification - mild

Review of the MIP images confirms the above findings.
IMPRESSION: 1. Vascular findings and measurements relevant to potential TAVR
procedure, as detailed above. Based on size, the patient has
suitable pelvic arterial access bilaterally. Please note, however,
that there are short-segment dissections in the proximal common
iliac arteries bilaterally, and there is severe tortuosity of the
external iliac arteries bilaterally.
2. Severe thickening and calcification of the aortic valve,
compatible with the reported clinical history of severe aortic
stenosis.
3. Aortic atherosclerosis, in addition to left main and 3 vessel
coronary artery disease. Status post median sternotomy for CABG
including [REDACTED] to the LAD.
4. Mild fusiform aneurysmal dilatation of the infrarenal abdominal
aorta which measures up to 3.3 x 3.2 cm. Recommend followup by
ultrasound in 3 years. This recommendation follows ACR consensus
guidelines: White Paper of the ACR Incidental Findings Committee II
on Vascular Findings. [HOSPITAL] 3012; [DATE].
5. Large calculus in the left side of the urinary bladder at or
adjacent to the left ureterovesicular junction. There is no
associated hydroureteronephrosis to indicate urinary tract
obstruction at this time. There also nonobstructive calculi in the
lower pole collecting systems of the kidneys bilaterally measuring
up to 12 mm in the lower pole the right kidney where there is
extensive overlying scarring.
6. Cholelithiasis without evidence of acute cholecystitis at this
time.
7. Colonic diverticulosis without evidence to suggest an acute
diverticulitis at this time.
8. Additional incidental findings, as above.

Aortic Atherosclerosis ([id number]-HIJ.J).

## 2017-08-29 ENCOUNTER — Ambulatory Visit (INDEPENDENT_AMBULATORY_CARE_PROVIDER_SITE_OTHER): Payer: Medicare Other | Admitting: Cardiology

## 2017-08-29 ENCOUNTER — Encounter: Payer: Self-pay | Admitting: Cardiology

## 2017-08-29 VITALS — BP 136/78 | HR 56 | Ht 74.0 in | Wt 169.2 lb

## 2017-08-29 DIAGNOSIS — E782 Mixed hyperlipidemia: Secondary | ICD-10-CM

## 2017-08-29 DIAGNOSIS — I493 Ventricular premature depolarization: Secondary | ICD-10-CM

## 2017-08-29 DIAGNOSIS — I25708 Atherosclerosis of coronary artery bypass graft(s), unspecified, with other forms of angina pectoris: Secondary | ICD-10-CM

## 2017-08-29 DIAGNOSIS — Z79899 Other long term (current) drug therapy: Secondary | ICD-10-CM

## 2017-08-29 DIAGNOSIS — I10 Essential (primary) hypertension: Secondary | ICD-10-CM

## 2017-08-29 DIAGNOSIS — I350 Nonrheumatic aortic (valve) stenosis: Secondary | ICD-10-CM | POA: Diagnosis not present

## 2017-08-29 MED ORDER — AMIODARONE HCL 200 MG PO TABS: ORAL_TABLET | ORAL | 0 refills | Status: DC

## 2017-08-29 MED ORDER — AMIODARONE HCL 200 MG PO TABS: 200.0000 mg | ORAL_TABLET | Freq: Every day | ORAL | 1 refills | Status: DC

## 2017-08-29 NOTE — Patient Instructions (Addendum)
Medication Instructions: Start Amiodarone   -  take 2 tablets (400 mg) twice daily for two weeks, then  -  take 1 tablet (200 mg) twice daily for two weeks, then   -  1 tablet (200 mg) daily   Labwork: None ordered  Procedures/Testing: Your physician has recommended that you wear a 48 hour holter monitor in 3 months -- to be completed and returned before following up with Dr. Elberta Fortisamnitz. Holter monitors are medical devices that record the heart's electrical activity. Doctors most often use these monitors to diagnose arrhythmias. Arrhythmias are problems with the speed or rhythm of the heartbeat. The monitor is a small, portable device. You can wear one while you do your normal daily activities. This is usually used to diagnose what is causing palpitations/syncope (passing out).  Follow-Up: Your physician recommends that you schedule a follow-up appointment in: 3 months with Dr. Elberta Fortisamnitz.  (after your holter monitor is completed)  Thank you for choosing [facility]!!   Dory HornSherri Price, RN [phone]  If you need a refill on your cardiac medications before your next appointment, please call your pharmacy.   Any Additional Special Instructions Will Be Listed Below (If Applicable).  Amiodarone tablets What is this medicine? AMIODARONE (a MEE oh da rone) is an antiarrhythmic drug. It helps make your heart beat regularly. Because of the side effects caused by this medicine, it is only used when other medicines have not worked. It is usually used for heartbeat problems that may be life threatening. This medicine may be used for other purposes; ask your health care provider or pharmacist if you have questions. COMMON BRAND NAME(S): Cordarone, Pacerone What should I tell my health care provider before I take this medicine? They need to know if you have any of these conditions: -liver disease -lung disease -other heart problems -thyroid disease -an unusual or allergic reaction to  amiodarone, iodine, other medicines, foods, dyes, or preservatives -pregnant or trying to get pregnant -breast-feeding How should I use this medicine? Take this medicine by mouth with a glass of water. Follow the directions on the prescription label. You can take this medicine with or without food. However, you should always take it the same way each time. Take your doses at regular intervals. Do not take your medicine more often than directed. Do not stop taking except on the advice of your doctor or health care professional. A special MedGuide will be given to you by the pharmacist with each prescription and refill. Be sure to read this information carefully each time. Talk to your pediatrician regarding the use of this medicine in children. Special care may be needed. Overdosage: If you think you have taken too much of this medicine contact a poison control center or emergency room at once. NOTE: This medicine is only for you. Do not share this medicine with others. What if I miss a dose? If you miss a dose, take it as soon as you can. If it is almost time for your next dose, take only that dose. Do not take double or extra doses. What may interact with this medicine? Do not take this medicine with any of the following medications: -abarelix -apomorphine -arsenic trioxide -certain antibiotics like erythromycin, gemifloxacin, levofloxacin, pentamidine -certain medicines for depression like amoxapine, tricyclic antidepressants -certain medicines for fungal infections like fluconazole, itraconazole, ketoconazole, posaconazole, voriconazole -certain medicines for irregular heart beat like disopyramide, dofetilide, dronedarone, ibutilide, propafenone, sotalol -certain medicines for malaria like chloroquine, halofantrine -cisapride -droperidol -haloperidol -hawthorn -maprotiline -  methadone -phenothiazines like chlorpromazine, mesoridazine, thioridazine -pimozide -ranolazine -red yeast  rice -vardenafil -ziprasidone This medicine may also interact with the following medications: -antiviral medicines for HIV or AIDS -certain medicines for blood pressure, heart disease, irregular heart beat -certain medicines for cholesterol like atorvastatin, cerivastatin, lovastatin, simvastatin -certain medicines for hepatitis C like sofosbuvir and ledipasvir; sofosbuvir -certain medicines for seizures like phenytoin -certain medicines for thyroid problems -certain medicines that treat or prevent blood clots like warfarin -cholestyramine -cimetidine -clopidogrel -cyclosporine -dextromethorphan -diuretics -fentanyl -general anesthetics -grapefruit juice -lidocaine -loratadine -methotrexate -other medicines that prolong the QT interval (cause an abnormal heart rhythm) -procainamide -quinidine -rifabutin, rifampin, or rifapentine -St. John's Wort -trazodone This list may not describe all possible interactions. Give your health care provider a list of all the medicines, herbs, non-prescription drugs, or dietary supplements you use. Also tell them if you smoke, drink alcohol, or use illegal drugs. Some items may interact with your medicine. What should I watch for while using this medicine? Your condition will be monitored closely when you first begin therapy. Often, this drug is first started in a hospital or other monitored health care setting. Once you are on maintenance therapy, visit your doctor or health care professional for regular checks on your progress. Because your condition and use of this medicine carry some risk, it is a good idea to carry an identification card, necklace or bracelet with details of your condition, medications, and doctor or health care professional. Dennis Bast may get drowsy or dizzy. Do not drive, use machinery, or do anything that needs mental alertness until you know how this medicine affects you. Do not stand or sit up quickly, especially if you are an older  patient. This reduces the risk of dizzy or fainting spells. This medicine can make you more sensitive to the sun. Keep out of the sun. If you cannot avoid being in the sun, wear protective clothing and use sunscreen. Do not use sun lamps or tanning beds/booths. You should have regular eye exams before and during treatment. Call your doctor if you have blurred vision, see halos, or your eyes become sensitive to light. Your eyes may get dry. It may be helpful to use a lubricating eye solution or artificial tears solution. If you are going to have surgery or a procedure that requires contrast dyes, tell your doctor or health care professional that you are taking this medicine. What side effects may I notice from receiving this medicine? Side effects that you should report to your doctor or health care professional as soon as possible: -allergic reactions like skin rash, itching or hives, swelling of the face, lips, or tongue -blue-gray coloring of the skin -blurred vision, seeing blue green halos, increased sensitivity of the eyes to light -breathing problems -chest pain -dark urine -fast, irregular heartbeat -feeling faint or light-headed -intolerance to heat or cold -nausea or vomiting -pain and swelling of the scrotum -pain, tingling, numbness in feet, hands -redness, blistering, peeling or loosening of the skin, including inside the mouth -spitting up blood -stomach pain -sweating -unusual or uncontrolled movements of body -unusually weak or tired -weight gain or loss -yellowing of the eyes or skin Side effects that usually do not require medical attention (report to your doctor or health care professional if they continue or are bothersome): -change in sex drive or performance -constipation -dizziness -headache -loss of appetite -trouble sleeping This list may not describe all possible side effects. Call your doctor for medical advice about side effects. You may report  side effects  to FDA at 1-800-FDA-1088. Where should I keep my medicine? Keep out of the reach of children. Store at room temperature between 20 and 25 degrees C (68 and 77 degrees F). Protect from light. Keep container tightly closed. Throw away any unused medicine after the expiration date. NOTE: This sheet is a summary. It may not cover all possible information. If you have questions about this medicine, talk to your doctor, pharmacist, or health care provider.  2018 Elsevier/Gold Standard (2014-03-09 19:48:11)

## 2017-08-29 NOTE — Progress Notes (Signed)
Electrophysiology Office Note   Date:  08/29/2017   ID:  BLUFORD SEDLER, DOB 12-02-1933, MRN [id number]  PCP:  Bernerd Limbo, MD  Cardiologist:  Cyril Loosen Primary Electrophysiologist:  Constance Haw, MD    Chief Complaint  Patient presents with  . Advice Only    PVC's  . Dizziness     History of Present Illness: Cory Bradley is a 83 y.o. male who is being seen today for the evaluation of PVCs at the request of Bernerd Limbo, MD. Presenting today for electrophysiology evaluation. He has a history of diabetes, hypertension, hyperlipidemia, CKG, aortic stenosis, and coronary disease status post inferior STEMI with PCI of the RCA followed by CABG 4 in 2005. Cath in 2018 showed patent grafts. He was admitted on 07/31/17 for TAVR. He wore a Holter monitor that showed 42% PVCs.    Today, he denies symptoms of palpitations, chest pain, shortness of breath, orthopnea, PND, lower extremity edema, claudication, dizziness, presyncope, syncope, bleeding, or neurologic sequela. The patient is tolerating medications without difficulties.    Past Medical History:  Diagnosis Date  . AAA (abdominal aortic aneurysm) (HCC)    3.3 cm infrarenal AAA 06/2017 CTA; 3 year f/u rec.  . Allergic rhinitis   . Aortic stenosis    a. Mild-mod by echo 01/2014. // b. Echo 6/18: Mod LVH EF 55-60, normal wall motion, Gr 2 DD, mod to severe AS (mean 40, peak 68; AVA by mean velocity 0.73 cm), mild AI, mod MAC, mild MR, severe LAE, mildly reduced RVSF, mod RAE  . CKD (chronic kidney disease), stage II   . Coronary artery disease    a. acute inferior MI s/p PTCA of RCA followed by CABGx4 in 2005,  . Diabetes mellitus ([place])   . ED (erectile dysfunction)   . Hyperlipidemia   . Hypertension   . Irregular heartbeat 10/2016  . Left carotid stenosis   . Memory loss   . Osteoarthritis   . Reflux   . Tobacco abuse    Past Surgical History:  Procedure Laterality Date  . APPENDECTOMY    . CORONARY  ARTERY BYPASS GRAFT    . ELBOW SURGERY    . RIGHT/LEFT HEART CATH AND CORONARY/GRAFT ANGIOGRAPHY N/A 05/28/2017   Procedure: Right/Left Heart Cath and Coronary/Graft Angiography;  Surgeon: Belva Crome, MD;  Location: [facility];  Service: Cardiovascular;  Laterality: N/A;  . TEE WITHOUT CARDIOVERSION N/A 07/31/2017   Procedure: TRANSESOPHAGEAL ECHOCARDIOGRAM (TEE);  Surgeon: Burnell Blanks, MD;  Location: [facility];  Service: Open Heart Surgery;  Laterality: N/A;  . TRANSCATHETER AORTIC VALVE REPLACEMENT, TRANSFEMORAL N/A 07/31/2017   Procedure: TRANSCATHETER AORTIC VALVE REPLACEMENT, TRANSFEMORAL;  Surgeon: Burnell Blanks, MD;  Location: [facility];  Service: Open Heart Surgery;  Laterality: N/A;     Current Outpatient Prescriptions  Medication Sig Dispense Refill  . aspirin EC 81 MG tablet Take 1 tablet (81 mg total) by mouth daily.    [name] atorvastatin (LIPITOR) 80 MG tablet Take 80 mg by mouth daily.    . clopidogrel (PLAVIX) 75 MG tablet Take 1 tablet (75 mg total) by mouth daily. 30 tablet 1  . diphenhydrAMINE (BENADRYL) 25 MG tablet Take 25 mg by mouth at bedtime.     . donepezil (ARICEPT) 5 MG tablet Take 5 mg by mouth at bedtime.    . irbesartan (AVAPRO) 150 MG tablet Take 1 tablet (150 mg total) by mouth daily. 30 tablet 11  . memantine (NAMENDA) 10  MG tablet Take 10 mg by mouth 2 (two) times daily.    . metFORMIN (GLUCOPHAGE) 500 MG tablet TAKE 1 TABLET (500 MG TOTAL) BY MOUTH AT BEDTIME  5  . niacin (NIASPAN) 1000 MG CR tablet Take 1 tablet (1,000 mg total) by mouth at bedtime. 90 tablet 1  . Omega-3 Fatty Acids (FISH OIL) 1000 MG CAPS Take 1,000 mg by mouth 2 (two) times daily.    [name] omeprazole (PRILOSEC) 40 MG capsule Take 40 mg by mouth daily.    . propranolol ER (INDERAL LA) 120 MG 24 hr capsule Take 120 mg by mouth daily.    [name] Specialty Vitamins Products (PROSTATE) TABS Take 1 tablet by mouth daily.    . temazepam (RESTORIL) 15 MG capsule Take 15 mg by mouth at  bedtime.      No current facility-administered medications for this visit.     Allergies:   Penicillins and Sulfa antibiotics   Social History:  The patient  reports that he has been smoking Cigarettes.  He has a 20.00 pack-year smoking history. He has never used smokeless tobacco. He reports that he does not drink alcohol or use drugs.   Family History:  The patient's family history includes Heart disease in his father.    ROS:  Please see the history of present illness.   Otherwise, review of systems is positive for none.   All other systems are reviewed and negative.    PHYSICAL EXAM: VS:  BP 136/78   Pulse (!) 56   Ht 6' 2"  (1.88 m)   Wt 169 lb 3.2 oz (76.7 kg)   BMI 21.72 kg/m  , BMI Body mass index is 21.72 kg/m. GEN: Well nourished, well developed, in no acute distress  HEENT: normal  Neck: no JVD, carotid bruits, or masses Cardiac: iRRR; no murmurs, rubs, or gallops,no edema  Respiratory:  clear to auscultation bilaterally, normal work of breathing GI: soft, nontender, nondistended, + BS MS: no deformity or atrophy  Skin: warm and dry Neuro:  Strength and sensation are intact Psych: euthymic mood, full affect  EKG:  EKG is not ordered today. Personal review of the ekg ordered 08/10/17 shows SR, PVCs, iRBBB  Recent Labs: 05/16/2017: Magnesium 1.9 07/26/2017: ALT 15 08/01/2017: BUN 15; Creatinine, Ser 1.11; Hemoglobin 12.0; Platelets 117; Potassium 4.2; Sodium 137    Lipid Panel     Component Value Date/Time   CHOL  11/07/2007 0305    88        ATP III CLASSIFICATION:  <200     mg/dL   Desirable  200-239  mg/dL   Borderline High  >=240    mg/dL   High   TRIG 49 11/07/2007 0305   HDL 41 11/07/2007 0305   CHOLHDL 2.1 11/07/2007 0305   VLDL 10 11/07/2007 0305   LDLCALC  11/07/2007 0305    37        Total Cholesterol/HDL:CHD Risk Coronary Heart Disease Risk Table                     Men   Women  1/2 Average Risk   3.4   3.3     Wt Readings from Last 3  Encounters:  08/29/17 169 lb 3.2 oz (76.7 kg)  08/10/17 169 lb 1.9 oz (76.7 kg)  08/02/17 167 lb (75.8 kg)      Other studies Reviewed: Additional studies/ records that were reviewed today include: TTE 07/31/17  Review of the above records today  demonstrates:  - Left ventricle: The cavity size was normal. There was mild   concentric hypertrophy. Systolic function was normal. The   estimated ejection fraction was in the range of 55% to 60%. Wall   motion was normal; there were no regional wall motion   abnormalities. Features are consistent with a pseudonormal left   ventricular filling pattern, with concomitant abnormal relaxation   and increased filling pressure (grade 2 diastolic dysfunction). - Aortic valve: A stent-valve (TAVR) bioprosthesis was present and   functioning normally. Valve area (VTI): 1.42 cm^2. Valve area   (Vmax): 1.49 cm^2. Valve area (Vmean): 1.34 cm^2. - Mitral valve: There was mild to moderate regurgitation directed   centrally. - Left atrium: The atrium was severely dilated. - Right ventricle: The cavity size was mildly dilated. Systolic   function was mildly reduced. - Right atrium: The atrium was severely dilated. - Atrial septum: No defect or patent foramen ovale was identified.  Holter 07/04/17 - personally reviewed  Basic rhythm is NSR  Frequent PVC's, multiform with occasional NSVT.  PVC's account for > 42% of activity during the period of monitoring.  ASSESSMENT AND PLAN:  1.  PVCs: Having a very high burden of PVCs. It is possible that his elevated PVC burden could cause him to have a decrease in his ejection fraction. Discussed with him medical management with amiodarone versus ablation. Risks and benefits of ablation were discussed. Risks include bleeding, tamponade, heart block, stroke, and others. At this time, he would like to try medical management prior to ablation. We'll start him on amiodarone and get a repeat monitor in 3 months prior to  the next visit. Due to his resting bradycardia, and his coronary disease, I do not feel like class I antiarrhythmics would be appropriate.  2. Aortic stenosis: s/p TAVR, no issues on exam  3. Coronary disease s/p CABG: Currently on aspirin and Plavix. No evidence of chest pain. Continue current management.  4. Hypertension: Continue Avapro and propranolol.  5. Hyperlipidemia: Continue atorvastatin    Current medicines are reviewed at length with the patient today.   The patient does not have concerns regarding his medicines.  The following changes were made today:  Start amiodarone  Labs/ tests ordered today include:  No orders of the defined types were placed in this encounter.    Disposition:   FU with Gillian Kluever 3 months  Signed, Nigeria Lasseter Meredith Leeds, MD  08/29/2017 4:02 PM     [address] [place] [address] [phone] (office) [phone] (fax)

## 2017-08-30 ENCOUNTER — Ambulatory Visit: Payer: Medicare Other | Admitting: Interventional Cardiology

## 2017-09-03 ENCOUNTER — Encounter: Payer: Self-pay | Admitting: Thoracic Surgery (Cardiothoracic Vascular Surgery)

## 2017-09-03 ENCOUNTER — Encounter: Payer: Self-pay | Admitting: Cardiovascular Disease

## 2017-09-03 ENCOUNTER — Other Ambulatory Visit: Payer: Self-pay

## 2017-09-03 ENCOUNTER — Ambulatory Visit (HOSPITAL_COMMUNITY): Payer: Medicare Other

## 2017-09-03 ENCOUNTER — Ambulatory Visit (INDEPENDENT_AMBULATORY_CARE_PROVIDER_SITE_OTHER): Payer: Medicare Other | Admitting: Cardiovascular Disease

## 2017-09-03 ENCOUNTER — Ambulatory Visit (HOSPITAL_COMMUNITY): Payer: Medicare Other | Attending: Cardiovascular Disease

## 2017-09-03 VITALS — BP 168/80 | HR 59 | Ht 74.0 in | Wt 170.8 lb

## 2017-09-03 DIAGNOSIS — Z952 Presence of prosthetic heart valve: Secondary | ICD-10-CM | POA: Diagnosis not present

## 2017-09-03 DIAGNOSIS — E785 Hyperlipidemia, unspecified: Secondary | ICD-10-CM | POA: Diagnosis not present

## 2017-09-03 DIAGNOSIS — I350 Nonrheumatic aortic (valve) stenosis: Secondary | ICD-10-CM

## 2017-09-03 DIAGNOSIS — I252 Old myocardial infarction: Secondary | ICD-10-CM | POA: Insufficient documentation

## 2017-09-03 DIAGNOSIS — I493 Ventricular premature depolarization: Secondary | ICD-10-CM | POA: Diagnosis not present

## 2017-09-03 DIAGNOSIS — Z953 Presence of xenogenic heart valve: Secondary | ICD-10-CM | POA: Diagnosis not present

## 2017-09-03 DIAGNOSIS — I119 Hypertensive heart disease without heart failure: Secondary | ICD-10-CM | POA: Insufficient documentation

## 2017-09-03 DIAGNOSIS — E119 Type 2 diabetes mellitus without complications: Secondary | ICD-10-CM | POA: Insufficient documentation

## 2017-09-03 DIAGNOSIS — I2510 Atherosclerotic heart disease of native coronary artery without angina pectoris: Secondary | ICD-10-CM | POA: Insufficient documentation

## 2017-09-03 DIAGNOSIS — Z951 Presence of aortocoronary bypass graft: Secondary | ICD-10-CM | POA: Diagnosis not present

## 2017-09-03 DIAGNOSIS — I340 Nonrheumatic mitral (valve) insufficiency: Secondary | ICD-10-CM | POA: Insufficient documentation

## 2017-09-03 DIAGNOSIS — I371 Nonrheumatic pulmonary valve insufficiency: Secondary | ICD-10-CM | POA: Diagnosis not present

## 2017-09-03 LAB — ECHOCARDIOGRAM COMPLETE
HEIGHTINCHES: 74 in
WEIGHTICAEL: 2732.8 [oz_av]

## 2017-09-03 NOTE — Progress Notes (Signed)
Chief Complaint  Patient presents with  . Follow-up    one month post TAVR   History of Present Illness: 83 yo male with history of CAD s/p 4V CABG in 2005, SVT, carotid artery disease, DM, HTN, HLD, chronic kidney disease and severe aortic stenosis s/p TAVR who is here today for one month TAVR follow up. He is followed in our office by Dr. Tamala Julian. His aortic valve stenosis has progressed and he underwent TAVR on 07/31/17 with placement of a 26 mm Edwards Sapien 3 bioprosthetic aortic valve from the right femoral artery. He did well following his procedure and was discharged on ASA and Plavix. He has been started on amiodarone in EP clinic by Dr. Lennie Odor given high PVC burden.   He is here today for follow up. The patient denies any chest pain, dyspnea, palpitations, lower extremity edema, orthopnea, PND, near syncope or syncope. Recent dizziness improved after ears cleaned out.   Primary Care Physician:Bouska, Shanon Brow, MD Primary Cardiologist: Dr. Daneen Schick   Past Medical History:  Diagnosis Date  . AAA (abdominal aortic aneurysm) (HCC)    3.3 cm infrarenal AAA 06/2017 CTA; 3 year f/u rec.  . Allergic rhinitis   . Aortic stenosis    a. Mild-mod by echo 01/2014. // b. Echo 6/18: Mod LVH EF 55-60, normal wall motion, Gr 2 DD, mod to severe AS (mean 40, peak 68; AVA by mean velocity 0.73 cm), mild AI, mod MAC, mild MR, severe LAE, mildly reduced RVSF, mod RAE  . CKD (chronic kidney disease), stage II   . Coronary artery disease    a. acute inferior MI s/p PTCA of RCA followed by CABGx4 in 2005,  . Diabetes mellitus ([place])   . ED (erectile dysfunction)   . Hyperlipidemia   . Hypertension   . Irregular heartbeat 10/2016  . Left carotid stenosis   . Memory loss   . Osteoarthritis   . Reflux   . Tobacco abuse     Past Surgical History:  Procedure Laterality Date  . APPENDECTOMY    . CORONARY ARTERY BYPASS GRAFT    . ELBOW SURGERY    . RIGHT/LEFT HEART CATH AND CORONARY/GRAFT  ANGIOGRAPHY N/A 05/28/2017   Procedure: Right/Left Heart Cath and Coronary/Graft Angiography;  Surgeon: Belva Crome, MD;  Location: [facility];  Service: Cardiovascular;  Laterality: N/A;  . TEE WITHOUT CARDIOVERSION N/A 07/31/2017   Procedure: TRANSESOPHAGEAL ECHOCARDIOGRAM (TEE);  Surgeon: Burnell Blanks, MD;  Location: [facility];  Service: Open Heart Surgery;  Laterality: N/A;  . TRANSCATHETER AORTIC VALVE REPLACEMENT, TRANSFEMORAL N/A 07/31/2017   Procedure: TRANSCATHETER AORTIC VALVE REPLACEMENT, TRANSFEMORAL;  Surgeon: Burnell Blanks, MD;  Location: [facility];  Service: Open Heart Surgery;  Laterality: N/A;    Current Outpatient Prescriptions  Medication Sig Dispense Refill  . amiodarone (PACERONE) 200 MG tablet Take 2 tablets (400 mg total) twice daily for two weeks.  Then take 1 tablet (200 mg total) twice daily for two weeks. 84 tablet 0  . aspirin EC 81 MG tablet Take 1 tablet (81 mg total) by mouth daily.    [name] atorvastatin (LIPITOR) 80 MG tablet Take 80 mg by mouth daily.    . clopidogrel (PLAVIX) 75 MG tablet Take 1 tablet (75 mg total) by mouth daily. 30 tablet 1  . diphenhydrAMINE (BENADRYL) 25 MG tablet Take 25 mg by mouth at bedtime.     . donepezil (ARICEPT) 5 MG tablet Take 5 mg by mouth at bedtime.    [name]  irbesartan (AVAPRO) 150 MG tablet Take 1 tablet (150 mg total) by mouth daily. 30 tablet 11  . memantine (NAMENDA) 10 MG tablet Take 10 mg by mouth 2 (two) times daily.    . metFORMIN (GLUCOPHAGE) 500 MG tablet TAKE 1 TABLET (500 MG TOTAL) BY MOUTH AT BEDTIME  5  . niacin (NIASPAN) 1000 MG CR tablet Take 1 tablet (1,000 mg total) by mouth at bedtime. 90 tablet 1  . Omega-3 Fatty Acids (FISH OIL) 1000 MG CAPS Take 1,000 mg by mouth 2 (two) times daily.    [name] omeprazole (PRILOSEC) 40 MG capsule Take 40 mg by mouth daily.    . propranolol ER (INDERAL LA) 120 MG 24 hr capsule Take 120 mg by mouth daily.    [name] Specialty Vitamins Products (PROSTATE) TABS Take 1  tablet by mouth daily.    . temazepam (RESTORIL) 15 MG capsule Take 15 mg by mouth at bedtime.      No current facility-administered medications for this visit.     Allergies  Allergen Reactions  . Penicillins Swelling    feet and hands swell Has patient had a PCN reaction causing immediate rash, facial/tongue/throat swelling, SOB or lightheadedness with hypotension: No Has patient had a PCN reaction causing severe rash involving mucus membranes or skin necrosis: No Has patient had a PCN reaction that required hospitalization: No Has patient had a PCN reaction occurring within the last 10 years: No If all of the above answers are then may proceed with Cephalosporin use.  . Sulfa Antibiotics Itching    Possible allergy    Social History   Social History  . Marital status: Widowed    Spouse name: N/A  . Number of children: 3  . Years of education: N/A   Occupational History  . Professor at [employer]    Social History Main Topics  . Smoking status: Current Every Day Smoker    Packs/day: 0.50    Years: 40.00    Types: Cigarettes  . Smokeless tobacco: Never Used     Comment: stopped for now, until surgery  . Alcohol use No  . Drug use: No  . Sexual activity: Not on file   Other Topics Concern  . Not on file   Social History Narrative  . No narrative on file    Family History  Problem Relation Age of Onset  . Heart disease Father     Review of Systems:  As stated in the HPI and otherwise negative.   BP (!) 168/80   Pulse (!) 59   Ht _0  (1.88 m)   Wt 170 lb 12.8 oz (77.5 kg)   SpO2 99%   BMI 21.93 kg/m   Physical Examination:   General: Well developed, well nourished, NAD  HEENT: OP clear, mucus membranes moist  SKIN: warm, dry. No rashes. Neuro: No focal deficits  Musculoskeletal: Muscle strength 5/5 all ext  Psychiatric: Mood and affect normal  Neck: No JVD, no carotid bruits, no thyromegaly, no lymphadenopathy.  Lungs:Clear bilaterally, no  wheezes, rhonci, crackles Cardiovascular: Regular rate and rhythm. No murmurs, gallops or rubs. Abdomen:Soft. Bowel sounds present. Non-tender.  Extremities: No lower extremity edema. Pulses are 2 + in the bilateral DP/PT.  Echo 917/18: - Left ventricle: The cavity size was normal. There was moderate   concentric hypertrophy. Systolic function was normal. The   estimated ejection fraction was in the range of 50% to 55%.   Doppler parameters are consistent with abnormal left ventricular  relaxation (grade 1 diastolic dysfunction). Doppler parameters   are consistent with elevated ventricular end-diastolic filling   pressure. - Aortic valve: S/P TAVR with a 26 mm Edwards-SAPIEN 3 valve. Mean   gradient (S): 6 mm Hg. Peak gradient (S): 11 mm Hg. - Aorta: The aorta was not dilated. - Mitral valve: There was mild regurgitation. - Left atrium: The atrium was moderately to severely dilated. - Right ventricle: The cavity size was normal. Wall thickness was   normal. Systolic function was normal. - Tricuspid valve: There was trivial regurgitation. - Pulmonic valve: There was mild regurgitation. - Pulmonary arteries: Systolic pressure was within the normal   range. - Pericardium, extracardiac: The pericardium was normal in   appearance.  Impressions:  - When compared to the OR TEE and 1 day day post TAVR TTE there is   are three new distinct jets of paravalvular leak, combined   attributing to mild-moderate leak. Previously one trivial jet.   The valve appears to be sitting well in the aortic position.   Transaortic gradients are unchanged and normal .  EKG:  EKG is not ordered today. The ekg ordered today demonstrates    Recent Labs: 05/16/2017: Magnesium 1.9 07/26/2017: ALT 15 08/01/2017: BUN 15; Creatinine, Ser 1.11; Hemoglobin 12.0; Platelets 117; Potassium 4.2; Sodium 137    Wt Readings from Last 3 Encounters:  09/03/17 170 lb 12.8 oz (77.5 kg)  08/29/17 169 lb 3.2 oz (76.7 kg)   08/10/17 169 lb 1.9 oz (76.7 kg)     Assessment and Plan:   1. Severe aortic valve stenosis: He is now one month post TAVR. He is doing well. A 26 mm Edwards Sapien 3 valve was placed from the right transfemoral artery. He did well following the procedure. He is NYHA class 1. Echo today shows normally functioning bioprosthetic AVR with several paravalvular leaks contributing to mild AI. Mean gradient 6 mmHg. Will continue ASA and Plavix.     Current medicines are reviewed at length with the patient today.  The patient does not have concerns regarding medicines.  The following changes have been made:  no change  Labs/ tests ordered today include:   No orders of the defined types were placed in this encounter.  Disposition:   Follow up with me in on year with echo.    Signed, Lauree Chandler, MD 09/03/2017 6:39 PM    [address], [place], [place]  [address] Phone: [phone]; Fax: [phone]

## 2017-09-03 NOTE — Patient Instructions (Signed)
Medication Instructions:  Your physician recommends that you continue on your current medications as directed. Please refer to the Current Medication list given to you today.   Labwork: none  Testing/Procedures: Your physician has requested that you have an echocardiogram. Echocardiography is a painless test that uses sound waves to create images of your heart. It provides your doctor with information about the size and shape of your heart and how well your heart's chambers and valves are working. This procedure takes approximately one hour. There are no restrictions for this procedure.  To be done in 11 months on day of appointment with Dr. Clifton James  Follow-Up: Your physician wants you to follow-up in: 6 months with Dr. Katrinka Blazing and 11 months with Dr. Clifton James.  You will receive a reminder letter in the mail two months in advance. If you don't receive a letter, please call our office to schedule the follow-up appointment.   Any Other Special Instructions Will Be Listed Below (If Applicable).     If you need a refill on your cardiac medications before your next appointment, please call your pharmacy.

## 2017-09-04 ENCOUNTER — Telehealth: Payer: Self-pay

## 2017-09-04 ENCOUNTER — Other Ambulatory Visit: Payer: Self-pay

## 2017-09-04 DIAGNOSIS — Z952 Presence of prosthetic heart valve: Secondary | ICD-10-CM

## 2017-09-04 DIAGNOSIS — I350 Nonrheumatic aortic (valve) stenosis: Secondary | ICD-10-CM

## 2017-09-04 DIAGNOSIS — T8203XA Leakage of heart valve prosthesis, initial encounter: Secondary | ICD-10-CM

## 2017-09-04 NOTE — Telephone Encounter (Signed)
Pt scheduled for Cardiac CT (TAVR protocol) on 09/12/2017 at 1:30 pm per Dr Gibson Ramp order.  I have left the pt a message to contact the office in regards to this appointment.    The pt will need to be advised of pre test instructions.  Arrive at 1:15 in Admitting at [facility] for check-in. No solid food 4 hours prior to test, liquids are okay but no caffeine 4 hours prior to CT.  The pt will need to hold metformin the day before, day of and 48 hours after CT scan.

## 2017-09-04 NOTE — Telephone Encounter (Signed)
[name] is returning your call. Thanks

## 2017-09-04 NOTE — Telephone Encounter (Signed)
I spoke with the pt and made him aware of Cardiac CT appointment and pre-test instructions. Pt verbalized understanding of instructions.

## 2017-09-10 ENCOUNTER — Ambulatory Visit: Payer: Medicare Other | Admitting: Physical Therapy

## 2017-09-12 ENCOUNTER — Ambulatory Visit (HOSPITAL_COMMUNITY): Payer: Medicare Other

## 2017-09-14 ENCOUNTER — Telehealth: Payer: Self-pay | Admitting: Cardiovascular Disease

## 2017-09-14 NOTE — Telephone Encounter (Signed)
New Message     Pt calling to reschedule his procedure that was cancel from Wednesday.

## 2017-09-14 NOTE — Telephone Encounter (Signed)
I spoke with the pt and have rescheduled Cardiac CT to Thursday 09/20/17 at 3:00 pm.  Pre test instructions.  Arrive at 2:30 pm in Admitting at [facility] for check-in. No solid food 4 hours prior to test, liquids are okay but no caffeine 4 hours prior to CT.  The pt will need to hold metformin the day before, day of and 48 hours after CT scan.

## 2017-09-20 ENCOUNTER — Ambulatory Visit (HOSPITAL_COMMUNITY)
Admission: RE | Admit: 2017-09-20 | Discharge: 2017-09-20 | Disposition: A | Discharge status: Home / Self Care | Payer: Medicare Other | Source: Ambulatory Visit | Attending: Cardiovascular Disease | Admitting: Cardiovascular Disease

## 2017-09-20 DIAGNOSIS — X58XXXA Exposure to other specified factors, initial encounter: Secondary | ICD-10-CM | POA: Insufficient documentation

## 2017-09-20 DIAGNOSIS — I350 Nonrheumatic aortic (valve) stenosis: Secondary | ICD-10-CM | POA: Diagnosis not present

## 2017-09-20 DIAGNOSIS — I351 Nonrheumatic aortic (valve) insufficiency: Secondary | ICD-10-CM | POA: Insufficient documentation

## 2017-09-20 DIAGNOSIS — T8203XA Leakage of heart valve prosthesis, initial encounter: Secondary | ICD-10-CM | POA: Diagnosis not present

## 2017-09-20 DIAGNOSIS — Z952 Presence of prosthetic heart valve: Secondary | ICD-10-CM | POA: Insufficient documentation

## 2017-09-20 NOTE — Progress Notes (Signed)
Pt done with CT heart. VS stable, no meds were given. IV removed. Pt escorted back to main entrance

## 2017-09-26 ENCOUNTER — Telehealth: Payer: Self-pay | Admitting: Cardiovascular Disease

## 2017-09-26 NOTE — Telephone Encounter (Signed)
New message   Patient calling to inform  Dr. Clifton James that he is not going to take medication any longer. Patient states Amiodarone is making him feel really sick on the stomach. Has not taken in 3 days.   Pt c/o medication issue:  1. Name of Medication: amiodarone (PACERONE) 200 MG tablet  2. How are you currently taking this medication (dosage and times per day)? Take 2 tablets (400 mg total) twice daily for two weeks. Then take 1 tablet (200 mg total) twice daily for two weeks. 3. Are you having a reaction (difficulty breathing--STAT)? no  4. What is your medication issue? Medication causing upset stomach

## 2017-09-26 NOTE — Telephone Encounter (Signed)
Per pt medicine made extremely nauseated and had sore throat since stopping med feels great   Pt aware will forward to Dr Clifton James for review .Cory Bradley

## 2017-09-28 NOTE — Telephone Encounter (Signed)
Due to high burden of PVCs, at risk of PVC induced cardiomyopathy. Would likely need another antiarrhythmic vs ablation. Sotalol would be best. He can come into clinic to discuss further.

## 2017-09-28 NOTE — Telephone Encounter (Signed)
Will forward to Dr Camnitz  

## 2017-09-30 NOTE — Telephone Encounter (Signed)
Thanks will.

## 2017-10-01 NOTE — Telephone Encounter (Signed)
Pt scheduled 11/1 to discuss

## 2017-10-17 NOTE — Progress Notes (Deleted)
Electrophysiology Office Note   Date:  10/17/2017   ID:  Cory Bradley, DOB May 14, 1933, MRN [id number]  PCP:  Bernerd Limbo, MD  Cardiologist:  Cyril Loosen Primary Electrophysiologist:  Constance Haw, MD    No chief complaint on file.    History of Present Illness: Cory Bradley is a 83 y.o. male who is being seen today for the evaluation of PVCs at the request of Bernerd Limbo, MD. Presenting today for electrophysiology evaluation. He has a history of diabetes, hypertension, hyperlipidemia, CKG, aortic stenosis, and coronary disease status post inferior STEMI with PCI of the RCA followed by CABG 4 in 2005. Cath in 2018 showed patent grafts. He was admitted on 07/31/17 for TAVR. He wore a Holter monitor that showed 42% PVCs.  Today, denies symptoms of palpitations, chest pain, shortness of breath, orthopnea, PND, lower extremity edema, claudication, dizziness, presyncope, syncope, bleeding, or neurologic sequela. The patient is tolerating medications without difficulties. ***    Past Medical History:  Diagnosis Date  . AAA (abdominal aortic aneurysm) (HCC)    3.3 cm infrarenal AAA 06/2017 CTA; 3 year f/u rec.  . Allergic rhinitis   . Aortic stenosis    a. Mild-mod by echo 01/2014. // b. Echo 6/18: Mod LVH EF 55-60, normal wall motion, Gr 2 DD, mod to severe AS (mean 40, peak 68; AVA by mean velocity 0.73 cm), mild AI, mod MAC, mild MR, severe LAE, mildly reduced RVSF, mod RAE  . CKD (chronic kidney disease), stage II   . Coronary artery disease    a. acute inferior MI s/p PTCA of RCA followed by CABGx4 in 2005,  . Diabetes mellitus ([place])   . ED (erectile dysfunction)   . Hyperlipidemia   . Hypertension   . Irregular heartbeat 10/2016  . Left carotid stenosis   . Memory loss   . Osteoarthritis   . Reflux   . Tobacco abuse    Past Surgical History:  Procedure Laterality Date  . APPENDECTOMY    . CORONARY ARTERY BYPASS GRAFT    . ELBOW SURGERY    . RIGHT/LEFT  HEART CATH AND CORONARY/GRAFT ANGIOGRAPHY N/A 05/28/2017   Procedure: Right/Left Heart Cath and Coronary/Graft Angiography;  Surgeon: Belva Crome, MD;  Location: [facility];  Service: Cardiovascular;  Laterality: N/A;  . TEE WITHOUT CARDIOVERSION N/A 07/31/2017   Procedure: TRANSESOPHAGEAL ECHOCARDIOGRAM (TEE);  Surgeon: Burnell Blanks, MD;  Location: [facility];  Service: Open Heart Surgery;  Laterality: N/A;  . TRANSCATHETER AORTIC VALVE REPLACEMENT, TRANSFEMORAL N/A 07/31/2017   Procedure: TRANSCATHETER AORTIC VALVE REPLACEMENT, TRANSFEMORAL;  Surgeon: Burnell Blanks, MD;  Location: [facility];  Service: Open Heart Surgery;  Laterality: N/A;     Current Outpatient Prescriptions  Medication Sig Dispense Refill  . amiodarone (PACERONE) 200 MG tablet Take 2 tablets (400 mg total) twice daily for two weeks.  Then take 1 tablet (200 mg total) twice daily for two weeks. 84 tablet 0  . aspirin EC 81 MG tablet Take 1 tablet (81 mg total) by mouth daily.    [name] atorvastatin (LIPITOR) 80 MG tablet Take 80 mg by mouth daily.    . clopidogrel (PLAVIX) 75 MG tablet Take 1 tablet (75 mg total) by mouth daily. 30 tablet 1  . diphenhydrAMINE (BENADRYL) 25 MG tablet Take 25 mg by mouth at bedtime.     . donepezil (ARICEPT) 5 MG tablet Take 5 mg by mouth at bedtime.    . irbesartan (AVAPRO) 150  MG tablet Take 1 tablet (150 mg total) by mouth daily. 30 tablet 11  . memantine (NAMENDA) 10 MG tablet Take 10 mg by mouth 2 (two) times daily.    . metFORMIN (GLUCOPHAGE) 500 MG tablet TAKE 1 TABLET (500 MG TOTAL) BY MOUTH AT BEDTIME  5  . niacin (NIASPAN) 1000 MG CR tablet Take 1 tablet (1,000 mg total) by mouth at bedtime. 90 tablet 1  . Omega-3 Fatty Acids (FISH OIL) 1000 MG CAPS Take 1,000 mg by mouth 2 (two) times daily.    [name] omeprazole (PRILOSEC) 40 MG capsule Take 40 mg by mouth daily.    . propranolol ER (INDERAL LA) 120 MG 24 hr capsule Take 120 mg by mouth daily.    [name] Specialty Vitamins  Products (PROSTATE) TABS Take 1 tablet by mouth daily.    . temazepam (RESTORIL) 15 MG capsule Take 15 mg by mouth at bedtime.      No current facility-administered medications for this visit.     Allergies:   Penicillins and Sulfa antibiotics   Social History:  The patient  reports that he has been smoking Cigarettes.  He has a 20.00 pack-year smoking history. He has never used smokeless tobacco. He reports that he does not drink alcohol or use drugs.   Family History:  The patient's family history includes Heart disease in his father.    ROS:  Please see the history of present illness.   Otherwise, review of systems is positive for ***.   All other systems are reviewed and negative.   PHYSICAL EXAM: VS:  There were no vitals taken for this visit. , BMI There is no height or weight on file to calculate BMI. GEN: Well nourished, well developed, in no acute distress  HEENT: normal  Neck: no JVD, carotid bruits, or masses Cardiac: ***RRR; no murmurs, rubs, or gallops,no edema  Respiratory:  clear to auscultation bilaterally, normal work of breathing GI: soft, nontender, nondistended, + BS MS: no deformity or atrophy  Skin: warm and dry Neuro:  Strength and sensation are intact Psych: euthymic mood, full affect  EKG:  EKG {ACTION; IS/IS [id number]} ordered today. Personal review of the ekg ordered *** shows ***  Recent Labs: 05/16/2017: Magnesium 1.9 07/26/2017: ALT 15 08/01/2017: BUN 15; Creatinine, Ser 1.11; Hemoglobin 12.0; Platelets 117; Potassium 4.2; Sodium 137    Lipid Panel     Component Value Date/Time   CHOL  11/07/2007 0305    88        ATP III CLASSIFICATION:  <200     mg/dL   Desirable  200-239  mg/dL   Borderline High  >=240    mg/dL   High   TRIG 49 11/07/2007 0305   HDL 41 11/07/2007 0305   CHOLHDL 2.1 11/07/2007 0305   VLDL 10 11/07/2007 0305   LDLCALC  11/07/2007 0305    37        Total Cholesterol/HDL:CHD Risk Coronary Heart Disease Risk Table                      Men   Women  1/2 Average Risk   3.4   3.3     Wt Readings from Last 3 Encounters:  09/03/17 170 lb 12.8 oz (77.5 kg)  08/29/17 169 lb 3.2 oz (76.7 kg)  08/10/17 169 lb 1.9 oz (76.7 kg)      Other studies Reviewed: Additional studies/ records that were reviewed today include: TTE 07/31/17  Review of the  above records today demonstrates:  - Left ventricle: The cavity size was normal. There was mild   concentric hypertrophy. Systolic function was normal. The   estimated ejection fraction was in the range of 55% to 60%. Wall   motion was normal; there were no regional wall motion   abnormalities. Features are consistent with a pseudonormal left   ventricular filling pattern, with concomitant abnormal relaxation   and increased filling pressure (grade 2 diastolic dysfunction). - Aortic valve: A stent-valve (TAVR) bioprosthesis was present and   functioning normally. Valve area (VTI): 1.42 cm^2. Valve area   (Vmax): 1.49 cm^2. Valve area (Vmean): 1.34 cm^2. - Mitral valve: There was mild to moderate regurgitation directed   centrally. - Left atrium: The atrium was severely dilated. - Right ventricle: The cavity size was mildly dilated. Systolic   function was mildly reduced. - Right atrium: The atrium was severely dilated. - Atrial septum: No defect or patent foramen ovale was identified.  Holter 07/04/17 - personally reviewed  Basic rhythm is NSR  Frequent PVC's, multiform with occasional NSVT.  PVC's account for > 42% of activity during the period of monitoring.  ASSESSMENT AND PLAN:  1.  PVCs: Having a very high burden of PVCs. It is possible that his elevated PVC burden could cause him to have a decrease in his ejection fraction. Discussed with him medical management with amiodarone versus ablation. Risks and benefits of ablation were discussed. Risks include bleeding, tamponade, heart block, stroke, and others. At this time, he would like to try medical  management prior to ablation. We'll start him on amiodarone and get a repeat monitor in 3 months prior to the next visit. Due to his resting bradycardia, and his coronary disease, I do not feel like class I antiarrhythmics would be appropriate.***  2. Aortic stenosis: s/p TAVR, no issues on exam***  3. Coronary disease s/p CABG: Currently on aspirin and Plavix. No evidence of chest pain. Continue current management.***  4. Hypertension: Continue Avapro and propranolol.***  5. Hyperlipidemia: Continue atorvastatin***    Current medicines are reviewed at length with the patient today.   The patient does not have concerns regarding his medicines.  The following changes were made today:  ***  Labs/ tests ordered today include:  No orders of the defined types were placed in this encounter.    Disposition:   FU with Oretta Berkland *** months  Signed, Alynah Schone Meredith Leeds, MD  10/17/2017 2:46 PM     [place] [place] [address] [phone] (office) [phone] (fax)

## 2017-10-18 ENCOUNTER — Telehealth: Payer: Self-pay | Admitting: Interventional Cardiology

## 2017-10-18 ENCOUNTER — Ambulatory Visit: Payer: Medicare Other | Admitting: Cardiology

## 2017-10-18 MED ORDER — CLOPIDOGREL BISULFATE 75 MG PO TABS: 75.0000 mg | ORAL_TABLET | Freq: Every day | ORAL | 2 refills | Status: DC

## 2017-10-18 NOTE — Telephone Encounter (Signed)
Pt was put on plavix in the hospital seen by Dr. Clifton JamesMcalhany for TAVR, would you like to refill this medication? Dr. Katrinka BlazingSmith is the primary cardiologist. Please advise

## 2017-10-18 NOTE — Telephone Encounter (Signed)
Ok to [facility].

## 2017-10-18 NOTE — Telephone Encounter (Signed)
Pt's medication was sent to pt's pharmacy as requested. Confirmation received.  °

## 2017-11-02 ENCOUNTER — Encounter: Payer: Self-pay | Admitting: Cardiology

## 2017-11-02 ENCOUNTER — Ambulatory Visit: Payer: Medicare Other | Admitting: Cardiology

## 2017-11-02 VITALS — BP 142/80 | HR 58 | Ht 74.0 in | Wt 169.8 lb

## 2017-11-02 DIAGNOSIS — I25810 Atherosclerosis of coronary artery bypass graft(s) without angina pectoris: Secondary | ICD-10-CM

## 2017-11-02 DIAGNOSIS — I493 Ventricular premature depolarization: Secondary | ICD-10-CM | POA: Diagnosis not present

## 2017-11-02 DIAGNOSIS — Z952 Presence of prosthetic heart valve: Secondary | ICD-10-CM

## 2017-11-02 DIAGNOSIS — I10 Essential (primary) hypertension: Secondary | ICD-10-CM

## 2017-11-02 DIAGNOSIS — E785 Hyperlipidemia, unspecified: Secondary | ICD-10-CM

## 2017-11-02 NOTE — Patient Instructions (Signed)
Medication Instructions:  Your physician recommends that you continue on your current medications as directed. Please refer to the Current Medication list given to you today.  * If you need a refill on your cardiac medications before your next appointment, please call your pharmacy. *  Labwork: None ordered  Testing/Procedures: Your physician has recommended that you wear a 48 hour holter monitor - please reschedule this our for 3 months --- please have completed prior to your 3 month follow up with Dr. Elberta Fortisamnitz. Holter monitors are medical devices that record the heart's electrical activity. Doctors most often use these monitors to diagnose arrhythmias. Arrhythmias are problems with the speed or rhythm of the heartbeat. The monitor is a small, portable device. You can wear one while you do your normal daily activities. This is usually used to diagnose what is causing palpitations/syncope (passing out).  Follow-Up: Your physician recommends that you schedule a follow-up appointment in: 3 months with Dr. Elberta Fortisamnitz (after holter monitor has been completed)  Thank you for choosing [facility]!!   Dory HornSherri Price, RN [phone]

## 2017-11-02 NOTE — Progress Notes (Signed)
Electrophysiology Office Note   Date:  11/02/2017   ID:  Cory Bradley, DOB 1933-07-03, MRN [id number]  PCP:  Bernerd Limbo, MD  Cardiologist:  Cyril Loosen Primary Electrophysiologist:  Constance Haw, MD    Chief Complaint  Patient presents with  . Follow-up    PVC's     History of Present Illness: Cory Bradley is a 83 y.o. male who is being seen today for the evaluation of PVCs at the request of Bernerd Limbo, MD. Presenting today for electrophysiology evaluation. He has a history of diabetes, hypertension, hyperlipidemia, CKG, aortic stenosis, and coronary disease status post inferior STEMI with PCI of the RCA followed by CABG 4 in 2005. Cath in 2018 showed patent grafts. He was admitted on 07/31/17 for TAVR. He wore a Holter monitor that showed 42% PVCs.  Today, he denies symptoms of palpitations, chest pain, shortness of breath, orthopnea, PND, lower extremity edema, claudication, dizziness, presyncope, syncope, bleeding, or neurologic sequela. The patient is tolerating medications without difficulties.  Feeling well today without major complaint.  He has not noted any episodes of palpitations.  That being said, he was asymptomatic and did not note PVCs previously.  He did not tolerate the amiodarone as he had GI upset.  He stopped it for a few days felt better, restarted it and continued to have GI issues.  He has not taken the medication for approximately a month.   Past Medical History:  Diagnosis Date  . AAA (abdominal aortic aneurysm) (HCC)    3.3 cm infrarenal AAA 06/2017 CTA; 3 year f/u rec.  . Allergic rhinitis   . Aortic stenosis    a. Mild-mod by echo 01/2014. // b. Echo 6/18: Mod LVH EF 55-60, normal wall motion, Gr 2 DD, mod to severe AS (mean 40, peak 68; AVA by mean velocity 0.73 cm), mild AI, mod MAC, mild MR, severe LAE, mildly reduced RVSF, mod RAE  . CKD (chronic kidney disease), stage II   . Coronary artery disease    a. acute inferior MI s/p  PTCA of RCA followed by CABGx4 in 2005,  . Diabetes mellitus ([place])   . ED (erectile dysfunction)   . Hyperlipidemia   . Hypertension   . Irregular heartbeat 10/2016  . Left carotid stenosis   . Memory loss   . Osteoarthritis   . Reflux   . Tobacco abuse    Past Surgical History:  Procedure Laterality Date  . APPENDECTOMY    . CORONARY ARTERY BYPASS GRAFT    . ELBOW SURGERY    . Right/Left Heart Cath and Coronary/Graft Angiography N/A 05/28/2017   Performed by Belva Crome, MD at [facility]  . TRANSCATHETER AORTIC VALVE REPLACEMENT, TRANSFEMORAL N/A 07/31/2017   Performed by Burnell Blanks, MD at [facility]  . TRANSESOPHAGEAL ECHOCARDIOGRAM (TEE) N/A 07/31/2017   Performed by Burnell Blanks, MD at [facility] OR     Current Outpatient Medications  Medication Sig Dispense Refill  . aspirin EC 81 MG tablet Take 1 tablet (81 mg total) by mouth daily.    [name] atorvastatin (LIPITOR) 80 MG tablet Take 80 mg by mouth daily.    . clopidogrel (PLAVIX) 75 MG tablet Take 1 tablet (75 mg total) by mouth daily. 90 tablet 2  . diphenhydrAMINE (BENADRYL) 25 MG tablet Take 25 mg by mouth at bedtime.     . donepezil (ARICEPT) 5 MG tablet Take 5 mg by mouth at bedtime.    . irbesartan (  AVAPRO) 150 MG tablet Take 1 tablet (150 mg total) by mouth daily. 30 tablet 11  . memantine (NAMENDA) 10 MG tablet Take 10 mg by mouth 2 (two) times daily.    . metFORMIN (GLUCOPHAGE) 500 MG tablet TAKE 1 TABLET (500 MG TOTAL) BY MOUTH AT BEDTIME  5  . niacin (NIASPAN) 1000 MG CR tablet Take 1 tablet (1,000 mg total) by mouth at bedtime. 90 tablet 1  . Omega-3 Fatty Acids (FISH OIL) 1000 MG CAPS Take 1,000 mg by mouth 2 (two) times daily.    [name] omeprazole (PRILOSEC) 40 MG capsule Take 40 mg by mouth daily.    . propranolol ER (INDERAL LA) 120 MG 24 hr capsule Take 120 mg by mouth daily.    [name] Specialty Vitamins Products (PROSTATE) TABS Take 1 tablet by mouth daily.    . temazepam (RESTORIL) 15 MG capsule  Take 15 mg by mouth at bedtime.      No current facility-administered medications for this visit.     Allergies:   Penicillins and Sulfa antibiotics   Social History:  The patient  reports that he has been smoking cigarettes.  He has a 20.00 pack-year smoking history. he has never used smokeless tobacco. He reports that he does not drink alcohol or use drugs.   Family History:  The patient's family history includes Heart disease in his father.    ROS:  Please see the history of present illness.   Otherwise, review of systems is positive for none.   All other systems are reviewed and negative.   PHYSICAL EXAM: VS:  BP (!) 142/80   Pulse (!) 58   Ht _0  (1.88 m)   Wt 169 lb 12.8 oz (77 kg)   SpO2 99%   BMI 21.80 kg/m  , BMI Body mass index is 21.8 kg/m. GEN: Well nourished, well developed, in no acute distress  HEENT: normal  Neck: no JVD, carotid bruits, or masses Cardiac: RRR; no murmurs, rubs, or gallops,no edema  Respiratory:  clear to auscultation bilaterally, normal work of breathing GI: soft, nontender, nondistended, + BS MS: no deformity or atrophy  Skin: warm and dry Neuro:  Strength and sensation are intact Psych: euthymic mood, full affect  EKG:  EKG is not ordered today. Personal review of the ekg ordered 08/10/17 shows sinus rhythm, frequent PVCs, rate 64   Recent Labs: 05/16/2017: Magnesium 1.9 07/26/2017: ALT 15 08/01/2017: BUN 15; Creatinine, Ser 1.11; Hemoglobin 12.0; Platelets 117; Potassium 4.2; Sodium 137    Lipid Panel     Component Value Date/Time   CHOL  11/07/2007 0305    88        ATP III CLASSIFICATION:  <200     mg/dL   Desirable  200-239  mg/dL   Borderline High  >=240    mg/dL   High   TRIG 49 11/07/2007 0305   HDL 41 11/07/2007 0305   CHOLHDL 2.1 11/07/2007 0305   VLDL 10 11/07/2007 0305   LDLCALC  11/07/2007 0305    37        Total Cholesterol/HDL:CHD Risk Coronary Heart Disease Risk Table                     Men   Women  1/2  Average Risk   3.4   3.3     Wt Readings from Last 3 Encounters:  11/02/17 169 lb 12.8 oz (77 kg)  09/03/17 170 lb 12.8 oz (77.5 kg)  08/29/17  169 lb 3.2 oz (76.7 kg)      Other studies Reviewed: Additional studies/ records that were reviewed today include: TTE 09/03/17 Review of the above records today demonstrates:  - Left ventricle: The cavity size was normal. There was moderate   concentric hypertrophy. Systolic function was normal. The   estimated ejection fraction was in the range of 50% to 55%.   Doppler parameters are consistent with abnormal left ventricular   relaxation (grade 1 diastolic dysfunction). Doppler parameters   are consistent with elevated ventricular end-diastolic filling   pressure. - Aortic valve: S/P TAVR with a 26 mm Edwards-SAPIEN 3 valve. Mean   gradient (S): 6 mm Hg. Peak gradient (S): 11 mm Hg. - Aorta: The aorta was not dilated. - Mitral valve: There was mild regurgitation. - Left atrium: The atrium was moderately to severely dilated. - Right ventricle: The cavity size was normal. Wall thickness was   normal. Systolic function was normal. - Tricuspid valve: There was trivial regurgitation. - Pulmonic valve: There was mild regurgitation. - Pulmonary arteries: Systolic pressure was within the normal   range. - Pericardium, extracardiac: The pericardium was normal in   appearance.  Holter 07/04/17 - personally reviewed  Basic rhythm is NSR  Frequent PVC's, multiform with occasional NSVT.  PVC's account for > 42% of activity during the period of monitoring.  ASSESSMENT AND PLAN:  1.  PVCs: Was started on amiodarone, but this was not tolerated due to GI complaints.  Unfortunately, his QTC is prolonged and thus he would likely not tolerate sotalol.  I did discuss with him the possibility of ablation in the future.  He is not having any PVCs on exam today.  His ejection fraction has remained stable.  He would prefer a watchful waiting approach.  We  Elishah Ashmore see him back in 3 months with a 48-hour monitor at that time.  2. Aortic stenosis: s/p TAVR. No issues.  3. Coronary disease s/p CABG: Only on aspirin and Plavix.  No chest pain.  Continue current management.  4. Hypertension: Continue Avapro and propranolol.  5. Hyperlipidemia: Continue atorvastatin  aorti Current medicines are reviewed at length with the patient today.   The patient does not have concerns regarding his medicines.  The following changes were made today: None  Labs/ tests ordered today include:  No orders of the defined types were placed in this encounter.    Disposition:   FU with Marvella Jenning 3 months  Signed, Cory Odriscoll Meredith Leeds, MD  11/02/2017 11:56 AM     [facility] [address] [place] [address] [phone] (office) [phone] (fax)

## 2017-11-27 ENCOUNTER — Ambulatory Visit: Payer: Medicare Other | Admitting: Cardiology

## 2018-01-03 ENCOUNTER — Telehealth: Payer: Self-pay | Admitting: Interventional Cardiology

## 2018-01-03 MED ORDER — CLINDAMYCIN HCL 300 MG PO CAPS: 600.0000 mg | ORAL_CAPSULE | Freq: Once | ORAL | 0 refills | Status: DC

## 2018-01-03 NOTE — Telephone Encounter (Signed)
Rx for clindamycin 600mg  sent to prefer pharmacy.

## 2018-01-03 NOTE — Telephone Encounter (Signed)
Cory Bradley is calling because he is needing a antibiotic sent to his pharmacy . He is going to get his teeth cleaned on 01/09/18.  Please send to  [facility] [place] .[name]. Please call if you have any questions . Thanks

## 2018-01-22 ENCOUNTER — Ambulatory Visit (INDEPENDENT_AMBULATORY_CARE_PROVIDER_SITE_OTHER): Payer: Medicare Other

## 2018-01-22 DIAGNOSIS — I493 Ventricular premature depolarization: Secondary | ICD-10-CM

## 2018-01-22 DIAGNOSIS — Z79899 Other long term (current) drug therapy: Secondary | ICD-10-CM | POA: Diagnosis not present

## 2018-02-03 NOTE — Progress Notes (Deleted)
Electrophysiology Office Note   Date:  02/03/2018   ID:  Cory Bradley, DOB Sep 18, 1933, MRN [id number]  PCP:  Bernerd Limbo, MD  Cardiologist:  Cyril Loosen Primary Electrophysiologist:  Constance Haw, MD    No chief complaint on file.    History of Present Illness: Cory Bradley is a 84 y.o. male who is being seen today for the evaluation of PVCs at the request of Bernerd Limbo, MD. Presenting today for electrophysiology evaluation. He has a history of diabetes, hypertension, hyperlipidemia, CKG, aortic stenosis, and coronary disease status post inferior STEMI with PCI of the RCA followed by CABG 4 in 2005. Cath in 2018 showed patent grafts. He was admitted on 07/31/17 for TAVR. He wore a Holter monitor that showed 42% PVCs.  Today, denies symptoms of palpitations, chest pain, shortness of breath, orthopnea, PND, lower extremity edema, claudication, dizziness, presyncope, syncope, bleeding, or neurologic sequela. The patient is tolerating medications without difficulties. ***    Past Medical History:  Diagnosis Date  . AAA (abdominal aortic aneurysm) (HCC)    3.3 cm infrarenal AAA 06/2017 CTA; 3 year f/u rec.  . Allergic rhinitis   . Aortic stenosis    a. Mild-mod by echo 01/2014. // b. Echo 6/18: Mod LVH EF 55-60, normal wall motion, Gr 2 DD, mod to severe AS (mean 40, peak 68; AVA by mean velocity 0.73 cm), mild AI, mod MAC, mild MR, severe LAE, mildly reduced RVSF, mod RAE  . CKD (chronic kidney disease), stage II   . Coronary artery disease    a. acute inferior MI s/p PTCA of RCA followed by CABGx4 in 2005,  . Diabetes mellitus ([place])   . ED (erectile dysfunction)   . Hyperlipidemia   . Hypertension   . Irregular heartbeat 10/2016  . Left carotid stenosis   . Memory loss   . Osteoarthritis   . Reflux   . Tobacco abuse    Past Surgical History:  Procedure Laterality Date  . APPENDECTOMY    . CORONARY ARTERY BYPASS GRAFT    . ELBOW SURGERY    . RIGHT/LEFT  HEART CATH AND CORONARY/GRAFT ANGIOGRAPHY N/A 05/28/2017   Procedure: Right/Left Heart Cath and Coronary/Graft Angiography;  Surgeon: Belva Crome, MD;  Location: [facility];  Service: Cardiovascular;  Laterality: N/A;  . TEE WITHOUT CARDIOVERSION N/A 07/31/2017   Procedure: TRANSESOPHAGEAL ECHOCARDIOGRAM (TEE);  Surgeon: Burnell Blanks, MD;  Location: [facility];  Service: Open Heart Surgery;  Laterality: N/A;  . TRANSCATHETER AORTIC VALVE REPLACEMENT, TRANSFEMORAL N/A 07/31/2017   Procedure: TRANSCATHETER AORTIC VALVE REPLACEMENT, TRANSFEMORAL;  Surgeon: Burnell Blanks, MD;  Location: [facility];  Service: Open Heart Surgery;  Laterality: N/A;     Current Outpatient Medications  Medication Sig Dispense Refill  . aspirin EC 81 MG tablet Take 1 tablet (81 mg total) by mouth daily.    [name] atorvastatin (LIPITOR) 80 MG tablet Take 80 mg by mouth daily.    . clopidogrel (PLAVIX) 75 MG tablet Take 1 tablet (75 mg total) by mouth daily. 90 tablet 2  . diphenhydrAMINE (BENADRYL) 25 MG tablet Take 25 mg by mouth at bedtime.     . donepezil (ARICEPT) 5 MG tablet Take 5 mg by mouth at bedtime.    . irbesartan (AVAPRO) 150 MG tablet Take 1 tablet (150 mg total) by mouth daily. 30 tablet 11  . memantine (NAMENDA) 10 MG tablet Take 10 mg by mouth 2 (two) times daily.    [name]  metFORMIN (GLUCOPHAGE) 500 MG tablet TAKE 1 TABLET (500 MG TOTAL) BY MOUTH AT BEDTIME  5  . niacin (NIASPAN) 1000 MG CR tablet Take 1 tablet (1,000 mg total) by mouth at bedtime. 90 tablet 1  . Omega-3 Fatty Acids (FISH OIL) 1000 MG CAPS Take 1,000 mg by mouth 2 (two) times daily.    [name] omeprazole (PRILOSEC) 40 MG capsule Take 40 mg by mouth daily.    . propranolol ER (INDERAL LA) 120 MG 24 hr capsule Take 120 mg by mouth daily.    [name] Specialty Vitamins Products (PROSTATE) TABS Take 1 tablet by mouth daily.    . temazepam (RESTORIL) 15 MG capsule Take 15 mg by mouth at bedtime.      No current facility-administered  medications for this visit.     Allergies:   Penicillins and Sulfa antibiotics   Social History:  The patient  reports that he has been smoking cigarettes.  He has a 20.00 pack-year smoking history. he has never used smokeless tobacco. He reports that he does not drink alcohol or use drugs.   Family History:  The patient's family history includes Heart disease in his father.   ROS:  Please see the history of present illness.   Otherwise, review of systems is positive for ***.   All other systems are reviewed and negative.   PHYSICAL EXAM: VS:  There were no vitals taken for this visit. , BMI There is no height or weight on file to calculate BMI. GEN: Well nourished, well developed, in no acute distress  HEENT: normal  Neck: no JVD, carotid bruits, or masses Cardiac: ***RRR; no murmurs, rubs, or gallops,no edema  Respiratory:  clear to auscultation bilaterally, normal work of breathing GI: soft, nontender, nondistended, + BS MS: no deformity or atrophy  Skin: warm and dry Neuro:  Strength and sensation are intact Psych: euthymic mood, full affect  EKG:  EKG {ACTION; IS/IS JOA:[id number]} ordered today. Personal review of the ekg ordered *** shows ***   Recent Labs: 05/16/2017: Magnesium 1.9 07/26/2017: ALT 15 08/01/2017: BUN 15; Creatinine, Ser 1.11; Hemoglobin 12.0; Platelets 117; Potassium 4.2; Sodium 137    Lipid Panel     Component Value Date/Time   CHOL  11/07/2007 0305    88        ATP III CLASSIFICATION:  <200     mg/dL   Desirable  200-239  mg/dL   Borderline High  >=240    mg/dL   High   TRIG 49 11/07/2007 0305   HDL 41 11/07/2007 0305   CHOLHDL 2.1 11/07/2007 0305   VLDL 10 11/07/2007 0305   LDLCALC  11/07/2007 0305    37        Total Cholesterol/HDL:CHD Risk Coronary Heart Disease Risk Table                     Men   Women  1/2 Average Risk   3.4   3.3     Wt Readings from Last 3 Encounters:  11/02/17 169 lb 12.8 oz (77 kg)  09/03/17 170 lb 12.8 oz (77.5  kg)  08/29/17 169 lb 3.2 oz (76.7 kg)      Other studies Reviewed: Additional studies/ records that were reviewed today include: TTE 09/03/17 Review of the above records today demonstrates:  - Left ventricle: The cavity size was normal. There was moderate   concentric hypertrophy. Systolic function was normal. The   estimated ejection fraction was in the range of  50% to 55%.   Doppler parameters are consistent with abnormal left ventricular   relaxation (grade 1 diastolic dysfunction). Doppler parameters   are consistent with elevated ventricular end-diastolic filling   pressure. - Aortic valve: S/P TAVR with a 26 mm Edwards-SAPIEN 3 valve. Mean   gradient (S): 6 mm Hg. Peak gradient (S): 11 mm Hg. - Aorta: The aorta was not dilated. - Mitral valve: There was mild regurgitation. - Left atrium: The atrium was moderately to severely dilated. - Right ventricle: The cavity size was normal. Wall thickness was   normal. Systolic function was normal. - Tricuspid valve: There was trivial regurgitation. - Pulmonic valve: There was mild regurgitation. - Pulmonary arteries: Systolic pressure was within the normal   range. - Pericardium, extracardiac: The pericardium was normal in   appearance.  Holter 07/04/17 - personally reviewed  Basic rhythm is NSR  Frequent PVC's, multiform with occasional NSVT.  PVC's account for > 42% of activity during the period of monitoring.  Holter 01/22/18 - personally reviewed Minimum HR: 41 BPM at 3:02:54 PM Maximum HR: 128 BPM at 6:03:53 PM Average HR: 58 BPM <1% PVCs, <1% APCs Sinus rhythm with occasional ventricular bigeminy Longest supraventricular run at least 24 beats   ASSESSMENT AND PLAN:  1.  PVCs: Was started on amiodarone, but this was not tolerated due to GI complaints.  Unfortunately, his QTC is prolonged and thus he would likely not tolerate sotalol.  I did discuss with him the possibility of ablation in the future.  He is not having any  PVCs on exam today.  His ejection fraction has remained stable.  He would prefer a watchful waiting approach.  We will see him back in 3 months with a 48-hour monitor at that time.***  2. Aortic stenosis: s/p TAVR. No issues.***  3. Coronary disease s/p CABG: Only on aspirin and Plavix.  No chest pain.  Continue current management.***  4. Hypertension: Continue Avapro and propranolol.***  5. Hyperlipidemia: Continue atorvastatin***  aorti Current medicines are reviewed at length with the patient today.   The patient does not have concerns regarding his medicines.  The following changes were made today: ***  Labs/ tests ordered today include:  No orders of the defined types were placed in this encounter.    Disposition:   FU with Will Camnitz *** months  Signed, Will Meredith Leeds, MD  02/03/2018 9:15 PM     [address] [place] [address] [phone] (office) [phone] (fax)

## 2018-02-04 ENCOUNTER — Ambulatory Visit: Payer: Medicare Other | Admitting: Cardiology

## 2018-02-05 ENCOUNTER — Encounter: Payer: Self-pay | Admitting: Cardiology

## 2018-03-11 ENCOUNTER — Telehealth: Payer: Self-pay | Admitting: Cardiovascular Disease

## 2018-03-11 DIAGNOSIS — M7989 Other specified soft tissue disorders: Secondary | ICD-10-CM

## 2018-03-11 DIAGNOSIS — R0602 Shortness of breath: Secondary | ICD-10-CM

## 2018-03-11 NOTE — Telephone Encounter (Signed)
Pt c/o swelling: STAT is pt has developed SOB within 24 hours  1) How much weight have you gained and in what time span? Gained 10 lbs last 3-4 mths   2) If swelling, where is the swelling located?feet and a little in the ankles   3) Are you currently taking a fluid pill? No  4) Are you currently SOB?No  Do you have a log of your daily weights (if so, list)? No  Have you gained 3 pounds in a day or 5 pounds in a week? No  Have you traveled recently?no

## 2018-03-11 NOTE — Telephone Encounter (Signed)
Pt concerned that feet and ankles are red and swollen x 3 weeks.  Pt with pitting edema.  States he pressed on it and "it took awhile to come back".  Denies pain or weeping.  No diuretics.  States BP normally 130's/70-80.  Denies increased salt in diet.  Seen PCP about a month ago and no swelling at that time.  Denies any other cardiac sx. States he actually feels really great since getting PVC's under control. Advised I will send message to Dr. Katrinka BlazingSmith for review.

## 2018-03-11 NOTE — Telephone Encounter (Signed)
Left message to call back  

## 2018-03-13 NOTE — Telephone Encounter (Signed)
Have any new medications been started?  If so, what? Please have a CBC and Cmet performed.  Perform a BNP.

## 2018-03-13 NOTE — Telephone Encounter (Signed)
Called patient to review Dr. Michaelle CopasSmith's advice. He denies new medications. Patient states he has an appointment with PCP on Monday. I offered him a lab appointment for tomorrow to go ahead and get lab work done here, patient accepted. I discussed patient's diet and he states he eats every meal except lunch out at restaurants. I asked him to review a typical day's diet and it is high in sodium (bacon, [employer]back steaks, [employer]). He drinks a Boost milkshake daily for lunch. I advised that we will call him with lab results and advice from Dr. Katrinka BlazingSmith. Patient verbalized understanding and agreement and thanked me for the call.

## 2018-03-14 ENCOUNTER — Other Ambulatory Visit: Payer: Medicare Other

## 2018-03-14 DIAGNOSIS — R0602 Shortness of breath: Secondary | ICD-10-CM

## 2018-03-14 DIAGNOSIS — M7989 Other specified soft tissue disorders: Secondary | ICD-10-CM

## 2018-03-15 LAB — CBC
HEMATOCRIT: 35.9 % — AB (ref 37.5–51.0)
HEMOGLOBIN: 12.3 g/dL — AB (ref 13.0–17.7)
MCH: 32.4 pg (ref 26.6–33.0)
MCHC: 34.3 g/dL (ref 31.5–35.7)
MCV: 95 fL (ref 79–97)
Platelets: 141 10*3/uL — ABNORMAL LOW (ref 150–379)
RBC: 3.8 x10E6/uL — ABNORMAL LOW (ref 4.14–5.80)
RDW: 13.5 % (ref 12.3–15.4)
WBC: 8.8 10*3/uL (ref 3.4–10.8)

## 2018-03-15 LAB — HEPATIC FUNCTION PANEL
ALBUMIN: 3.8 g/dL (ref 3.5–4.7)
ALK PHOS: 66 IU/L (ref 39–117)
ALT: 19 IU/L (ref 0–44)
AST: 26 IU/L (ref 0–40)
BILIRUBIN TOTAL: 0.4 mg/dL (ref 0.0–1.2)
Bilirubin, Direct: 0.15 mg/dL (ref 0.00–0.40)
Total Protein: 6.2 g/dL (ref 6.0–8.5)

## 2018-03-15 LAB — BASIC METABOLIC PANEL
BUN/Creatinine Ratio: 14 (ref 10–24)
BUN: 19 mg/dL (ref 8–27)
CO2: 23 mmol/L (ref 20–29)
CREATININE: 1.4 mg/dL — AB (ref 0.76–1.27)
Calcium: 9.1 mg/dL (ref 8.6–10.2)
Chloride: 106 mmol/L (ref 96–106)
GFR, EST AFRICAN AMERICAN: 54 mL/min/{1.73_m2} — AB (ref >59)
GFR, EST NON AFRICAN AMERICAN: 46 mL/min/{1.73_m2} — AB (ref >59)
Glucose: 98 mg/dL (ref 65–99)
POTASSIUM: 4.6 mmol/L (ref 3.5–5.2)
SODIUM: 141 mmol/L (ref 134–144)

## 2018-03-15 LAB — PRO B NATRIURETIC PEPTIDE: NT-Pro BNP: 552 pg/mL — ABNORMAL HIGH (ref 0–486)

## 2018-03-20 NOTE — Progress Notes (Signed)
Cardiology Office Note    Date:  03/21/2018   ID:  BURREL LEGRAND, DOB 1933-08-18, MRN [id number]  PCP:  Bernerd Limbo, MD  Cardiologist: Sinclair Grooms, MD   Chief Complaint  Patient presents with  . Cardiac Valve Problem  . Coronary Artery Disease  . Congestive Heart Failure    History of Present Illness:  Cory Bradley is a 84 y.o. male with CAD, CABG, PVC's, CKD 3, and TAVR for AS.   Overall, Yussuf is doing well but is in because he is noted mild bilateral ankle edema.  He denies orthopnea, PND, chest pain, and dyspnea on exertion.  He has not had any reduction in physical activity.  He has gained about 10 pounds which he attributes to increased caloric intake after the death of his wife and with the onset of the "March madness".  He has never before had lower extremity edema.  Recent blood work demonstrated no evidence of liver dysfunction and albumin was normal.  Creatinine is mildly elevated.  Past Medical History:  Diagnosis Date  . AAA (abdominal aortic aneurysm) (HCC)    3.3 cm infrarenal AAA 06/2017 CTA; 3 year f/u rec.  . Allergic rhinitis   . Aortic stenosis    a. Mild-mod by echo 01/2014. // b. Echo 6/18: Mod LVH EF 55-60, normal wall motion, Gr 2 DD, mod to severe AS (mean 40, peak 68; AVA by mean velocity 0.73 cm), mild AI, mod MAC, mild MR, severe LAE, mildly reduced RVSF, mod RAE  . CKD (chronic kidney disease), stage II   . Coronary artery disease    a. acute inferior MI s/p PTCA of RCA followed by CABGx4 in 2005,  . Diabetes mellitus ([place])   . ED (erectile dysfunction)   . Hyperlipidemia   . Hypertension   . Irregular heartbeat 10/2016  . Left carotid stenosis   . Memory loss   . Osteoarthritis   . Reflux   . Tobacco abuse     Past Surgical History:  Procedure Laterality Date  . APPENDECTOMY    . CORONARY ARTERY BYPASS GRAFT    . ELBOW SURGERY    . RIGHT/LEFT HEART CATH AND CORONARY/GRAFT ANGIOGRAPHY N/A 05/28/2017   Procedure: Right/Left  Heart Cath and Coronary/Graft Angiography;  Surgeon: Belva Crome, MD;  Location: [facility];  Service: Cardiovascular;  Laterality: N/A;  . TEE WITHOUT CARDIOVERSION N/A 07/31/2017   Procedure: TRANSESOPHAGEAL ECHOCARDIOGRAM (TEE);  Surgeon: Burnell Blanks, MD;  Location: [facility];  Service: Open Heart Surgery;  Laterality: N/A;  . TRANSCATHETER AORTIC VALVE REPLACEMENT, TRANSFEMORAL N/A 07/31/2017   Procedure: TRANSCATHETER AORTIC VALVE REPLACEMENT, TRANSFEMORAL;  Surgeon: Burnell Blanks, MD;  Location: [facility];  Service: Open Heart Surgery;  Laterality: N/A;    Current Medications: Outpatient Medications Prior to Visit  Medication Sig Dispense Refill  . aspirin EC 81 MG tablet Take 1 tablet (81 mg total) by mouth daily.    [name] atorvastatin (LIPITOR) 80 MG tablet Take 80 mg by mouth daily.    . clopidogrel (PLAVIX) 75 MG tablet Take 1 tablet (75 mg total) by mouth daily. 90 tablet 2  . diphenhydrAMINE (BENADRYL) 25 MG tablet Take 25 mg by mouth at bedtime.     . donepezil (ARICEPT) 5 MG tablet Take 5 mg by mouth at bedtime.    . irbesartan (AVAPRO) 150 MG tablet Take 1 tablet (150 mg total) by mouth daily. 30 tablet 11  . memantine (NAMENDA) 10 MG  tablet Take 10 mg by mouth 2 (two) times daily.    . metFORMIN (GLUCOPHAGE) 500 MG tablet TAKE 1 TABLET (500 MG TOTAL) BY MOUTH AT BEDTIME  5  . niacin (NIASPAN) 1000 MG CR tablet Take 1 tablet (1,000 mg total) by mouth at bedtime. 90 tablet 1  . Omega-3 Fatty Acids (FISH OIL) 1000 MG CAPS Take 1,000 mg by mouth 2 (two) times daily.    [name] omeprazole (PRILOSEC) 40 MG capsule Take 40 mg by mouth daily.    . propranolol ER (INDERAL LA) 120 MG 24 hr capsule Take 120 mg by mouth daily.    [name] Specialty Vitamins Products (PROSTATE) TABS Take 1 tablet by mouth daily.    . temazepam (RESTORIL) 15 MG capsule Take 15 mg by mouth at bedtime.      No facility-administered medications prior to visit.      Allergies:   Penicillins and Sulfa  antibiotics   Social History   Socioeconomic History  . Marital status: Widowed    Spouse name: Not on file  . Number of children: 3  . Years of education: Not on file  . Highest education level: Not on file  Occupational History  . Occupation: Professor at [employer]  . Financial resource strain: Not on file  . Food insecurity:    Worry: Not on file    Inability: Not on file  . Transportation needs:    Medical: Not on file    Non-medical: Not on file  Tobacco Use  . Smoking status: Former Smoker    Packs/day: 0.50    Years: 40.00    Pack years: 20.00    Types: Cigarettes  . Smokeless tobacco: Never Used  . Tobacco comment: stopped for now, until surgery  Substance and Sexual Activity  . Alcohol use: No    Alcohol/week: 0.0 oz  . Drug use: No  . Sexual activity: Not on file  Lifestyle  . Physical activity:    Days per week: Not on file    Minutes per session: Not on file  . Stress: Not on file  Relationships  . Social connections:    Talks on phone: Not on file    Gets together: Not on file    Attends religious service: Not on file    Active member of club or organization: Not on file    Attends meetings of clubs or organizations: Not on file    Relationship status: Not on file  Other Topics Concern  . Not on file  Social History Narrative  . Not on file     Family History:  The patient's family history includes Heart disease in his father.   ROS:   Please see the history of present illness.    Both his wife and son have died over the past 57 months.  He was under a lot of stress with both situations.  He seems to be handling it okay. All other systems reviewed and are negative.   PHYSICAL EXAM:   VS:  BP (!) 150/70   Pulse 71   Ht 6' 2"  (1.88 m)   Wt 184 lb 12.8 oz (83.8 kg)   SpO2 98%   BMI 23.73 kg/m    GEN: Well nourished, well developed, in no acute distress  HEENT: normal  Neck: no JVD, carotid bruits, or masses Cardiac:  RRR; mild diastolic aortic regurgitation murmur, no rubs, or gallops, but there is trace to 1+ bilateral ankle edema.  Respiratory:  clear to auscultation bilaterally, normal work of breathing GI: soft, nontender, nondistended, + BS MS: no deformity or atrophy  Skin: warm and dry, no rash Neuro:  Alert and Oriented x 3, Strength and sensation are intact Psych: euthymic mood, full affect  Wt Readings from Last 3 Encounters:  03/21/18 184 lb 12.8 oz (83.8 kg)  11/02/17 169 lb 12.8 oz (77 kg)  09/03/17 170 lb 12.8 oz (77.5 kg)      Studies/Labs Reviewed:   EKG:  EKG  Not done  Recent Labs: 05/16/2017: Magnesium 1.9 03/14/2018: ALT 19; BUN 19; Creatinine, Ser 1.40; Hemoglobin 12.3; NT-Pro BNP 552; Platelets 141; Potassium 4.6; Sodium 141   Lipid Panel    Component Value Date/Time   CHOL  11/07/2007 0305    88        ATP III CLASSIFICATION:  <200     mg/dL   Desirable  200-239  mg/dL   Borderline High  >=240    mg/dL   High   TRIG 49 11/07/2007 0305   HDL 41 11/07/2007 0305   CHOLHDL 2.1 11/07/2007 0305   VLDL 10 11/07/2007 0305   LDLCALC  11/07/2007 0305    37        Total Cholesterol/HDL:CHD Risk Coronary Heart Disease Risk Table                     Men   Women  1/2 Average Risk   3.4   3.3    Additional studies/ records that were reviewed today include:  2D Doppler echocardiogram September 2018: ------------------------------------------------------------------- Study Conclusions   - Left ventricle: The cavity size was normal. There was moderate   concentric hypertrophy. Systolic function was normal. The   estimated ejection fraction was in the range of 50% to 55%.   Doppler parameters are consistent with abnormal left ventricular   relaxation (grade 1 diastolic dysfunction). Doppler parameters   are consistent with elevated ventricular end-diastolic filling   pressure. - Aortic valve: S/P TAVR with a 26 mm Edwards-SAPIEN 3 valve. Mean   gradient (S): 6 mm Hg.  Peak gradient (S): 11 mm Hg. - Aorta: The aorta was not dilated. - Mitral valve: There was mild regurgitation. - Left atrium: The atrium was moderately to severely dilated. - Right ventricle: The cavity size was normal. Wall thickness was   normal. Systolic function was normal. - Tricuspid valve: There was trivial regurgitation. - Pulmonic valve: There was mild regurgitation. - Pulmonary arteries: Systolic pressure was within the normal   range. - Pericardium, extracardiac: The pericardium was normal in   appearance.   Impressions:   - When compared to the OR TEE and 1 day day post TAVR TTE there is   are three new distinct jets of paravalvular leak, combined   attributing to mild-moderate leak. Previously one trivial jet.   The valve appears to be sitting well in the aortic position.   Transaortic gradients are unchanged and normal .    ASSESSMENT:    1. Ankle edema, bilateral   2. Coronary artery disease involving coronary bypass graft of native heart without angina pectoris   3. CKD (chronic kidney disease), stage II   4. Status post transcatheter aortic valve replacement (TAVR) using bioprosthesis   5. Essential hypertension      PLAN:  In order of problems listed above:  1. Etiology is uncertain.  No evidence of DVT.  With elevated blood pressure we will add low-dose HCTZ 12.5 mg/day.  In 7-14 days he needs to have a basic metabolic panel and BNP performed. 2. Stable without angina. 3. Most recent creatinine was 1.41-week ago.  Albumin was normal. 4. Mild aortic regurgitation is heard.  3 distinct jets of perivalvular regurgitation were noted on echo from September 2018.  No clinical evidence of pulmonary congestion or elevated right atrial pressure based on exam. 5. HCTZ will help improve blood pressure to a target of 130/80 mmHg.  Six-month clinical follow-up unless difficulty with diuretic, abnormal laboratory data after starting diuretic, or edema does not  resolve.  Medication Adjustments/Labs and Tests Ordered: Current medicines are reviewed at length with the patient today.  Concerns regarding medicines are outlined above.  Medication changes, Labs and Tests ordered today are listed in the Patient Instructions below. Patient Instructions  Medication Instructions:  1) START Hydrochlorothiazide 12.69m once daily  Labwork: Your physician recommends that you return for lab work in: 7-10 days (BMET, Pro BNP)   Testing/Procedures: Your physician has requested that you have an echocardiogram 1-2 weeks prior to seeing Dr. STamala Julianback in 6 months. Echocardiography is a painless test that uses sound waves to create images of your heart. It provides your doctor with information about the size and shape of your heart and how well your heart's chambers and valves are working. This procedure takes approximately one hour. There are no restrictions for this procedure.    Follow-Up: Your physician wants you to follow-up in: 6 months with Dr. STamala Julian You will receive a reminder letter in the mail two months in advance. If you don't receive a letter, please call our office to schedule the follow-up appointment.   Any Other Special Instructions Will Be Listed Below (If Applicable).     If you need a refill on your cardiac medications before your next appointment, please call your pharmacy.      Signed, HSinclair Grooms MD  03/21/2018 12:25 PM    [facility] [address] [place]  [id number]: [phone] Fax: [phone]

## 2018-03-21 ENCOUNTER — Encounter: Payer: Self-pay | Admitting: Interventional Cardiology

## 2018-03-21 ENCOUNTER — Ambulatory Visit: Payer: Medicare Other | Admitting: Interventional Cardiology

## 2018-03-21 VITALS — BP 150/70 | HR 71 | Ht 74.0 in | Wt 184.8 lb

## 2018-03-21 DIAGNOSIS — N182 Chronic kidney disease, stage 2 (mild): Secondary | ICD-10-CM

## 2018-03-21 DIAGNOSIS — Z953 Presence of xenogenic heart valve: Secondary | ICD-10-CM | POA: Diagnosis not present

## 2018-03-21 DIAGNOSIS — I25810 Atherosclerosis of coronary artery bypass graft(s) without angina pectoris: Secondary | ICD-10-CM

## 2018-03-21 DIAGNOSIS — M25471 Effusion, right ankle: Secondary | ICD-10-CM | POA: Diagnosis not present

## 2018-03-21 DIAGNOSIS — I10 Essential (primary) hypertension: Secondary | ICD-10-CM

## 2018-03-21 DIAGNOSIS — M25472 Effusion, left ankle: Secondary | ICD-10-CM

## 2018-03-21 MED ORDER — HYDROCHLOROTHIAZIDE 12.5 MG PO CAPS: 12.5000 mg | ORAL_CAPSULE | Freq: Every day | ORAL | 3 refills | Status: DC

## 2018-03-21 NOTE — Patient Instructions (Signed)
Medication Instructions:  1) START Hydrochlorothiazide 12.5mg  once daily  Labwork: Your physician recommends that you return for lab work in: 7-10 days (BMET, Pro BNP)   Testing/Procedures: Your physician has requested that you have an echocardiogram 1-2 weeks prior to seeing Dr. Katrinka BlazingSmith back in 6 months. Echocardiography is a painless test that uses sound waves to create images of your heart. It provides your doctor with information about the size and shape of your heart and how well your heart's chambers and valves are working. This procedure takes approximately one hour. There are no restrictions for this procedure.    Follow-Up: Your physician wants you to follow-up in: 6 months with Dr. Katrinka BlazingSmith. You will receive a reminder letter in the mail two months in advance. If you don't receive a letter, please call our office to schedule the follow-up appointment.   Any Other Special Instructions Will Be Listed Below (If Applicable).     If you need a refill on your cardiac medications before your next appointment, please call your pharmacy.

## 2018-03-28 ENCOUNTER — Other Ambulatory Visit: Payer: Medicare Other | Admitting: *Deleted

## 2018-03-28 DIAGNOSIS — I25810 Atherosclerosis of coronary artery bypass graft(s) without angina pectoris: Secondary | ICD-10-CM

## 2018-03-28 DIAGNOSIS — M25471 Effusion, right ankle: Secondary | ICD-10-CM

## 2018-03-28 DIAGNOSIS — M25472 Effusion, left ankle: Principal | ICD-10-CM

## 2018-03-28 LAB — BASIC METABOLIC PANEL
BUN/Creatinine Ratio: 22 (ref 10–24)
BUN: 34 mg/dL — ABNORMAL HIGH (ref 8–27)
CALCIUM: 9.2 mg/dL (ref 8.6–10.2)
CO2: 21 mmol/L (ref 20–29)
CREATININE: 1.58 mg/dL — AB (ref 0.76–1.27)
Chloride: 101 mmol/L (ref 96–106)
GFR calc Af Amer: 46 mL/min/{1.73_m2} — ABNORMAL LOW (ref >59)
GFR calc non Af Amer: 40 mL/min/{1.73_m2} — ABNORMAL LOW (ref >59)
GLUCOSE: 137 mg/dL — AB (ref 65–99)
Potassium: 4.2 mmol/L (ref 3.5–5.2)
SODIUM: 141 mmol/L (ref 134–144)

## 2018-03-28 LAB — PRO B NATRIURETIC PEPTIDE: NT-Pro BNP: 318 pg/mL (ref 0–486)

## 2018-04-01 ENCOUNTER — Telehealth: Payer: Self-pay | Admitting: *Deleted

## 2018-04-01 DIAGNOSIS — I350 Nonrheumatic aortic (valve) stenosis: Secondary | ICD-10-CM

## 2018-04-01 DIAGNOSIS — N182 Chronic kidney disease, stage 2 (mild): Secondary | ICD-10-CM

## 2018-04-01 NOTE — Telephone Encounter (Signed)
-----   Message from Lyn RecordsHenry W Smith, MD sent at 03/29/2018  5:44 PM EDT ----- Let the patient know the blood work reveals mild worsening in kidney function compared to 2 weeks ago.  Repeat basic metabolic panel in 2 weeks.  No evidence of heart failure.  Has edema in legs improved? A copy will be sent to Tracey HarriesBouska, David, MD

## 2018-04-01 NOTE — Telephone Encounter (Signed)
Spoke with pt and went over results and recommendations per Dr. Katrinka BlazingSmith.  Pt will come for labs 4/29.  Pt verbalized understanding and was in agreement with this plan.

## 2018-04-15 ENCOUNTER — Other Ambulatory Visit: Payer: Medicare Other | Admitting: *Deleted

## 2018-04-15 DIAGNOSIS — I350 Nonrheumatic aortic (valve) stenosis: Secondary | ICD-10-CM

## 2018-04-15 DIAGNOSIS — N182 Chronic kidney disease, stage 2 (mild): Secondary | ICD-10-CM

## 2018-04-15 LAB — BASIC METABOLIC PANEL
BUN / CREAT RATIO: 16 (ref 10–24)
BUN: 24 mg/dL (ref 8–27)
CHLORIDE: 103 mmol/L (ref 96–106)
CO2: 24 mmol/L (ref 20–29)
Calcium: 9.4 mg/dL (ref 8.6–10.2)
Creatinine, Ser: 1.52 mg/dL — ABNORMAL HIGH (ref 0.76–1.27)
GFR calc Af Amer: 49 mL/min/{1.73_m2} — ABNORMAL LOW (ref >59)
GFR calc non Af Amer: 42 mL/min/{1.73_m2} — ABNORMAL LOW (ref >59)
GLUCOSE: 136 mg/dL — AB (ref 65–99)
POTASSIUM: 4 mmol/L (ref 3.5–5.2)
SODIUM: 142 mmol/L (ref 134–144)

## 2018-05-02 ENCOUNTER — Encounter: Payer: Self-pay | Admitting: Gastroenterology

## 2018-05-21 ENCOUNTER — Encounter: Payer: Self-pay | Admitting: Family

## 2018-05-21 ENCOUNTER — Ambulatory Visit (HOSPITAL_COMMUNITY)
Admission: RE | Admit: 2018-05-21 | Discharge: 2018-05-21 | Disposition: A | Discharge status: Home / Self Care | Payer: Medicare Other | Source: Ambulatory Visit | Attending: Family | Admitting: Family

## 2018-05-21 ENCOUNTER — Ambulatory Visit: Payer: Medicare Other | Admitting: Family

## 2018-05-21 VITALS — BP 103/63 | HR 61 | Temp 97.1°F | Resp 16 | Ht 74.0 in | Wt 181.7 lb

## 2018-05-21 DIAGNOSIS — I6523 Occlusion and stenosis of bilateral carotid arteries: Secondary | ICD-10-CM | POA: Insufficient documentation

## 2018-05-21 DIAGNOSIS — R0989 Other specified symptoms and signs involving the circulatory and respiratory systems: Secondary | ICD-10-CM

## 2018-05-21 NOTE — Progress Notes (Signed)
Chief Complaint: Follow up Extracranial Carotid Artery Stenosis   History of Present Illness  Cory Bradley is a 84 y.o. male whom Dr. Donnetta Hutching and Gerri Lins, PA-C, evaluated initially on 04-11-16,referred by Melina Copa PA-C, for evaluation of carotid stenosis. He returns today for follow up. He denies any known history of stroke or TIA. Specifically he deniesa history of amaurosis fugax or monocular blindness, unilateral facial drooping, hemiplegia, orreceptive or expressive aphasia.   The carotid stenosis was found due to audible bruit on the left and a carotid duplex was performed. Other medical problems include CAD s/p CABG x 4, hyperlipidemia managed with Lipitor, DM managed with metformin, and hypertension managed with propanolol.   He had a TAVR on 07-31-17 by Dr. Julianne Handler and Dr. Cyndia Bent. He states he no longer has fatigue nor dyspnea since this.   He denies claudication sx's with walking.  His feet and hands get cold easily.   He states that after sitting a while, and he gets up too quickly, he has fallen a couple of times, states his cardiologist is aware.  He reports that he has a hx of sciatica that no longer bothers him.   His son died in 03-18-2018 (was paraplegic for years after an [facility]), wife died in 2017-03-18.   Pt Diabetic: yes, states it is "within the range Dr. Alta Corning wanted it".  Pt smoker: former smoker (1/3 ppd, started in 2016 after he found that his wife was in the last stages of pancreatic cancer, she passed away in March 18, 2017, states he had also smoked in the past) He states he has not smoked since 2009 (he does not remember that he told me he resumed smoking at his May 2018 visit).   Pt meds include: Statin : yes ASA: yes, 81 mg Other anticoagulants/antiplatelets: Plavix    Past Medical History:  Diagnosis Date  . AAA (abdominal aortic aneurysm) (HCC)    3.3 cm infrarenal AAA 06/2017 CTA; 3 year f/u rec.  . Allergic rhinitis   .  Aortic stenosis    a. Mild-mod by echo 01/2014. // b. Echo 6/18: Mod LVH EF 55-60, normal wall motion, Gr 2 DD, mod to severe AS (mean 40, peak 68; AVA by mean velocity 0.73 cm), mild AI, mod MAC, mild MR, severe LAE, mildly reduced RVSF, mod RAE  . CKD (chronic kidney disease), stage II   . Coronary artery disease    a. acute inferior MI s/p PTCA of RCA followed by CABGx4 in 2005,  . Diabetes mellitus ([place])   . ED (erectile dysfunction)   . Hyperlipidemia   . Hypertension   . Irregular heartbeat 10/2016  . Left carotid stenosis   . Memory loss   . Osteoarthritis   . Reflux   . Tobacco abuse     Social History Social History   Tobacco Use  . Smoking status: Former Smoker    Packs/day: 0.50    Years: 40.00    Pack years: 20.00    Types: Cigarettes  . Smokeless tobacco: Never Used  . Tobacco comment: stopped for now, until surgery  Substance Use Topics  . Alcohol use: No    Alcohol/week: 0.0 oz  . Drug use: No    Family History Family History  Problem Relation Age of Onset  . Heart disease Father     Surgical History Past Surgical History:  Procedure Laterality Date  . APPENDECTOMY    . CORONARY ARTERY BYPASS GRAFT    .  ELBOW SURGERY    . RIGHT/LEFT HEART CATH AND CORONARY/GRAFT ANGIOGRAPHY N/A 05/28/2017   Procedure: Right/Left Heart Cath and Coronary/Graft Angiography;  Surgeon: Belva Crome, MD;  Location: [facility];  Service: Cardiovascular;  Laterality: N/A;  . TEE WITHOUT CARDIOVERSION N/A 07/31/2017   Procedure: TRANSESOPHAGEAL ECHOCARDIOGRAM (TEE);  Surgeon: Burnell Blanks, MD;  Location: [facility];  Service: Open Heart Surgery;  Laterality: N/A;  . TRANSCATHETER AORTIC VALVE REPLACEMENT, TRANSFEMORAL N/A 07/31/2017   Procedure: TRANSCATHETER AORTIC VALVE REPLACEMENT, TRANSFEMORAL;  Surgeon: Burnell Blanks, MD;  Location: [facility];  Service: Open Heart Surgery;  Laterality: N/A;    Allergies  Allergen Reactions  . Penicillins Swelling     SWELLING, HANDS and FEET  Has patient had a PCN reaction causing immediate rash, facial/tongue/throat swelling, SOB or lightheadedness with hypotension: No Has patient had a PCN reaction causing severe rash involving mucus membranes or skin necrosis: No Has patient had a PCN reaction that required hospitalization: No Has patient had a PCN reaction occurring within the last 10 years: No  . Sulfa Antibiotics Itching    Current Outpatient Medications  Medication Sig Dispense Refill  . aspirin EC 81 MG tablet Take 1 tablet (81 mg total) by mouth daily.    [name] atorvastatin (LIPITOR) 80 MG tablet Take 80 mg by mouth daily.    . clopidogrel (PLAVIX) 75 MG tablet Take 1 tablet (75 mg total) by mouth daily. 90 tablet 2  . diphenhydrAMINE (BENADRYL) 25 MG tablet Take 25 mg by mouth at bedtime.     . donepezil (ARICEPT) 5 MG tablet Take 5 mg by mouth at bedtime.    . hydrochlorothiazide (MICROZIDE) 12.5 MG capsule Take 1 capsule (12.5 mg total) by mouth daily. 90 capsule 3  . irbesartan (AVAPRO) 150 MG tablet Take 1 tablet (150 mg total) by mouth daily. 30 tablet 11  . memantine (NAMENDA) 10 MG tablet Take 10 mg by mouth 2 (two) times daily.    . metFORMIN (GLUCOPHAGE) 500 MG tablet TAKE 1 TABLET (500 MG TOTAL) BY MOUTH AT BEDTIME  5  . niacin (NIASPAN) 1000 MG CR tablet Take 1 tablet (1,000 mg total) by mouth at bedtime. 90 tablet 1  . Omega-3 Fatty Acids (FISH OIL) 1000 MG CAPS Take 1,000 mg by mouth 2 (two) times daily.    [name] omeprazole (PRILOSEC) 40 MG capsule Take 40 mg by mouth daily.    . propranolol ER (INDERAL LA) 120 MG 24 hr capsule Take 120 mg by mouth daily.    [name] Specialty Vitamins Products (PROSTATE) TABS Take 1 tablet by mouth daily.    . temazepam (RESTORIL) 15 MG capsule Take 15 mg by mouth at bedtime.      No current facility-administered medications for this visit.     Review of Systems : See HPI for pertinent positives and negatives.  Physical Examination  Vitals:    05/21/18 1126 05/21/18 1128  BP: 113/69 103/63  Pulse: 61   Resp: 16   Temp: (!) 97.1 F (36.2 C)   TempSrc: Oral   SpO2: 99%   Weight: 181 lb 11.2 oz (82.4 kg)   Height: 6' 2"  (1.88 m)    Body mass index is 23.33 kg/m.  General: WDWN malein NAD GAIT:normal Eyes: PERRLA Pulmonary: Respirations are non-labored, fairair movement, CTAB Cardiac: Regularrhythm and rate, +click   VASCULAR EXAM Carotid Bruits Right Left   Negative Positive, or transmitted cardiac click   Abdominal aortic pulse is notpalpable. Radial pulses  are 2+palpable and equal.   LE Pulses Right Left       FEMORAL  2+ palpable 2+ palpable   POPLITEAL 3+palpable  1+palpable  POSTERIOR TIBIAL not palpable  not palpable   DORSALIS PEDIS ANTERIOR TIBIAL faintlypalpable  notpalpable     Gastrointestinal: soft, nontender, BS WNL, no r/g, nopalpable masses. Musculoskeletal: Nomuscle atrophy/wasting. M/S 5/5 throughout, extremities without ischemic changes.Mild-moderate kyphosis.  Skin: No rashes, no ulcers, no cellulitis. Toes of both feet are cool to touch and slightly ruddy.   Neurologic:  A&O X 3; appropriate affect, sensation is normal; speech is normal, CN 2-12 intact, pain and light touch intact in extremities, motor exam as listed above. Psychiatric: Normal thought content, mood appropriate to clinical situation.    Assessment: Cory Bradley is a 84 y.o. male who has a left carotid bruit, no history of stroke or TIA.  His DM seems to be well controlled on metformin as his only DM agent.  He c/o feet feeling cold, pedal pulses are faintly palpable, toes are cool to touch, he denies claudication type sx's with walking; will check ABI's on his return.  Prominent popliteal pulses: will check popliteal duplex on his return in a year.    DATA Carotid Duplex (05-21-18): Right ICA with 1-39% stenosis, left proximal ICA with  40-59% stenosis. Bilateral vertebral artery flow is antegrade. Bilateral subclavian artery waveforms are normal. No significant change compared to the exams on 11-14-16 and 05-16-17.   Carotid duplex 09/29/2015 from another facility: Right ICA: <40% Left ICA:40-59%   Plan:  The patient was counseled re smoking cessation and given several free resources re smoking cessation.  Follow-up in 1 year with Carotid Duplex scan, bilateral popliteal artery duplex, and ABI's.   I discussed in depth with the patient the nature of atherosclerosis, and emphasized the importance of maximal medical management including strict control of blood pressure, blood glucose, and lipid levels, obtaining regular exercise, and continued cessation of smoking.  The patient is aware that without maximal medical management the underlying atherosclerotic disease process will progress, limiting the benefit of any interventions. The patient was given information about stroke prevention and what symptoms should prompt the patient to seek immediate medical care. Thank you for allowing [place] to participate in this patient's care.  Clemon Chambers, RN, MSN, FNP-C [facility] Office: [phone]  Clinic Physician: Scot Dock on call  05/21/18 11:33 AM

## 2018-05-21 NOTE — Patient Instructions (Signed)

## 2018-05-29 ENCOUNTER — Telehealth: Payer: Self-pay | Admitting: Interventional Cardiology

## 2018-05-29 NOTE — Telephone Encounter (Signed)
Follow Up:      Returning your call from today. 

## 2018-05-29 NOTE — Telephone Encounter (Signed)
Spoke with pt and scheduled him to see Dr. Katrinka BlazingSmith in October.

## 2018-08-16 ENCOUNTER — Other Ambulatory Visit: Payer: Self-pay | Admitting: Interventional Cardiology

## 2018-09-10 NOTE — Progress Notes (Signed)
[facility]                                       Cardiology Office Note    Date:  09/11/2018   ID:  Cory Bradley, DOB 03/26/1933, MRN [id number]  PCP:  System, Pcp Not In  Cardiologist:  Dr. Tamala Julian / Dr. Angelena Form and Dr. Cyndia Bent (TAVR)  CC: 1 year s/p TAVR   History of Present Illness:  Cory Bradley is a 84 y.o. male with a history of DMT2, HTN, HLD, CKD, CAD s/p CABG x4 (2005), PVCs (42% PVC burden previously) on amiodarone and severe AS s/p TAVR 07/2017 who presents to clinic for 1 year follow up.   He has a history of CAD with an acute inferior STEMI with PCI of the RCA followed by CABG x 4 in 2005. He underwent repeat cath on 05/28/2017 which showed patent grafts to the PDA, diagonal, and OM with a patent LIMA to the LAD. EF normal at 55-60%, by echo, however his AS had progressed to severe stage with mean gradient of 40 mm Hg.   He underwent TAVR with 26 mm Edwards Sapien THV in 07/2017.  1 month postop echo showed 3 new distinct jets of paravalvular leak, combined attributing to mild to moderate leak.  Gradients remained normal with a mean gradient of 6 mmHg and a peak gradient of 11 mmHg.  Given new paravalvular leak follow-up cardiac CT was arranged which showed normal leaflet morphology and motion with no obvious reason for commissural regurgitation seen on one month post implant TTE.  He was seen by Dr. Tamala Julian in 03/2018. He had some LE edema and was started on HCTZ.  Today he presents to clinic for follow up. He has had a tough year with his wife passing from pancreatic cancer and his son dying from complications of quadriplegia. He is very active exercising at the gym and also doing yard work, although he has slowed down since the passing of his son. He denies dyspnea on exertion.  No CP or SOB. No LE edema, orthopnea or PND. No dizziness or syncope. No blood in stool or urine. No palpitations. He overall can tell a big difference  in the way that he feels since having TAVR.    Past Medical History:  Diagnosis Date  . AAA (abdominal aortic aneurysm) (HCC)    3.3 cm infrarenal AAA 06/2017 CTA; 3 year f/u rec.  . Allergic rhinitis   . Aortic stenosis    a. Mild-mod by echo 01/2014. // b. Echo 6/18: Mod LVH EF 55-60, normal wall motion, Gr 2 DD, mod to severe AS (mean 40, peak 68; AVA by mean velocity 0.73 cm), mild AI, mod MAC, mild MR, severe LAE, mildly reduced RVSF, mod RAE  . CKD (chronic kidney disease), stage II   . Coronary artery disease    a. acute inferior MI s/p PTCA of RCA followed by CABGx4 in 2005,  . Diabetes mellitus ([place])   . ED (erectile dysfunction)   . Hyperlipidemia   . Hypertension   . Irregular heartbeat 10/2016  . Left carotid stenosis   . Memory loss   . Osteoarthritis   . Reflux   . Tobacco abuse     Past Surgical History:  Procedure Laterality Date  . APPENDECTOMY    . CORONARY ARTERY BYPASS GRAFT    .  ELBOW SURGERY    . RIGHT/LEFT HEART CATH AND CORONARY/GRAFT ANGIOGRAPHY N/A 05/28/2017   Procedure: Right/Left Heart Cath and Coronary/Graft Angiography;  Surgeon: Belva Crome, MD;  Location: [facility];  Service: Cardiovascular;  Laterality: N/A;  . TEE WITHOUT CARDIOVERSION N/A 07/31/2017   Procedure: TRANSESOPHAGEAL ECHOCARDIOGRAM (TEE);  Surgeon: Burnell Blanks, MD;  Location: [facility];  Service: Open Heart Surgery;  Laterality: N/A;  . TRANSCATHETER AORTIC VALVE REPLACEMENT, TRANSFEMORAL N/A 07/31/2017   Procedure: TRANSCATHETER AORTIC VALVE REPLACEMENT, TRANSFEMORAL;  Surgeon: Burnell Blanks, MD;  Location: [facility];  Service: Open Heart Surgery;  Laterality: N/A;    Current Medications: Outpatient Medications Prior to Visit  Medication Sig Dispense Refill  . aspirin EC 81 MG tablet Take 1 tablet (81 mg total) by mouth daily.    [name] atorvastatin (LIPITOR) 80 MG tablet Take 80 mg by mouth daily.    . diphenhydrAMINE (BENADRYL) 25 MG tablet Take 25 mg by  mouth at bedtime.     . donepezil (ARICEPT) 5 MG tablet Take 5 mg by mouth at bedtime.    . hydrochlorothiazide (MICROZIDE) 12.5 MG capsule Take 1 capsule (12.5 mg total) by mouth daily. 90 capsule 3  . irbesartan (AVAPRO) 150 MG tablet Take 1 tablet (150 mg total) by mouth daily. 30 tablet 11  . memantine (NAMENDA) 10 MG tablet Take 10 mg by mouth 2 (two) times daily.    . metFORMIN (GLUCOPHAGE) 500 MG tablet TAKE 1 TABLET (500 MG TOTAL) BY MOUTH AT BEDTIME  5  . niacin (NIASPAN) 1000 MG CR tablet Take 1 tablet (1,000 mg total) by mouth at bedtime. 90 tablet 1  . Omega-3 Fatty Acids (FISH OIL) 1000 MG CAPS Take 1,000 mg by mouth 2 (two) times daily.    [name] omeprazole (PRILOSEC) 40 MG capsule Take 40 mg by mouth daily.    . propranolol ER (INDERAL LA) 120 MG 24 hr capsule Take 120 mg by mouth daily.    [name] Specialty Vitamins Products (PROSTATE) TABS Take 1 tablet by mouth daily.    . temazepam (RESTORIL) 15 MG capsule Take 15 mg by mouth at bedtime.     . clopidogrel (PLAVIX) 75 MG tablet TAKE ONE (1) TABLET BY MOUTH EVERY DAY 90 tablet 2   No facility-administered medications prior to visit.      Allergies:   Penicillins and Sulfa antibiotics   Social History   Socioeconomic History  . Marital status: Widowed    Spouse name: Not on file  . Number of children: 3  . Years of education: Not on file  . Highest education level: Not on file  Occupational History  . Occupation: Professor at [employer]  . Financial resource strain: Not on file  . Food insecurity:    Worry: Not on file    Inability: Not on file  . Transportation needs:    Medical: Not on file    Non-medical: Not on file  Tobacco Use  . Smoking status: Former Smoker    Packs/day: 0.50    Years: 40.00    Pack years: 20.00    Types: Cigarettes  . Smokeless tobacco: Never Used  . Tobacco comment: stopped for now, until surgery  Substance and Sexual Activity  . Alcohol use: No    Alcohol/week: 0.0  standard drinks  . Drug use: No  . Sexual activity: Not on file  Lifestyle  . Physical activity:    Days per week: Not on file  Minutes per session: Not on file  . Stress: Not on file  Relationships  . Social connections:    Talks on phone: Not on file    Gets together: Not on file    Attends religious service: Not on file    Active member of club or organization: Not on file    Attends meetings of clubs or organizations: Not on file    Relationship status: Not on file  Other Topics Concern  . Not on file  Social History Narrative  . Not on file     Family History:  The patient'sfamily history includes Heart disease in his father.     ROS:   Please see the history of present illness.    ROS All other systems reviewed and are negative.   PHYSICAL EXAM:   VS:  BP (!) 156/72   Pulse (!) 55   Ht _0  (1.88 m)   Wt 181 lb 6.4 oz (82.3 kg)   SpO2 98%   BMI 23.29 kg/m    GEN: Well nourished, well developed, in no acute distress  HEENT: normal  Neck: no JVD, carotid bruits, or masses Cardiac: RRR; soft murmur, rubs, or gallops,no edema  Respiratory:  clear to auscultation bilaterally, normal work of breathing GI: soft, nontender, nondistended, + BS MS: no deformity or atrophy  Skin: warm and dry, no rash Neuro:  Alert and Oriented x 3, Strength and sensation are intact Psych: euthymic mood, full affect    Wt Readings from Last 3 Encounters:  09/11/18 181 lb 6.4 oz (82.3 kg)  05/21/18 181 lb 11.2 oz (82.4 kg)  03/21/18 184 lb 12.8 oz (83.8 kg)      Studies/Labs Reviewed:   EKG:  EKG is NOTordered today.  Recent Labs: 03/14/2018: ALT 19; Hemoglobin 12.3; Platelets 141 03/28/2018: NT-Pro BNP 318 04/15/2018: BUN 24; Creatinine, Ser 1.52; Potassium 4.0; Sodium 142   Lipid Panel    Component Value Date/Time   CHOL  11/07/2007 0305    88        ATP III CLASSIFICATION:  <200     mg/dL   Desirable  200-239  mg/dL   Borderline High  >=240    mg/dL   High    TRIG 49 11/07/2007 0305   HDL 41 11/07/2007 0305   CHOLHDL 2.1 11/07/2007 0305   VLDL 10 11/07/2007 0305   LDLCALC  11/07/2007 0305    37        Total Cholesterol/HDL:CHD Risk Coronary Heart Disease Risk Table                     Men   Women  1/2 Average Risk   3.4   3.3    Additional studies/ records that were reviewed today include:  TAVR OPERATIVE NOTE   Date of Procedure:                07/31/2017  Preoperative Diagnosis:      Severe Aortic Stenosis  Procedure:        Transcatheter Aortic Valve Replacement - Percutaneous Right Transfemoral Approach             Edwards Sapien 3 THV (size 26 mm, model # 9600TFX, serial # [id number])              Co-Surgeons:                        Gaye Pollack, MD and Lauree Chandler,  MD Pre-operative Echo Findings: ? Severe aortic stenosis ? Normal left ventricular systolic function  Post-operative Echo Findings: ? no paravalvular leak ? Normal left ventricular systolic function  _______________  2D ECHO 09/03/2017 ( 1 month s/p TAVR) Study Conclusions - Left ventricle: The cavity size was normal. There was moderate   concentric hypertrophy. Systolic function was normal. The   estimated ejection fraction was in the range of 50% to 55%.   Doppler parameters are consistent with abnormal left ventricular   relaxation (grade 1 diastolic dysfunction). Doppler parameters   are consistent with elevated ventricular end-diastolic filling   pressure. - Aortic valve: S/P TAVR with a 26 mm Edwards-SAPIEN 3 valve. Mean   gradient (S): 6 mm Hg. Peak gradient (S): 11 mm Hg. - Aorta: The aorta was not dilated. - Mitral valve: There was mild regurgitation. - Left atrium: The atrium was moderately to severely dilated. - Right ventricle: The cavity size was normal. Wall thickness was   normal. Systolic function was normal. - Tricuspid valve: There was trivial regurgitation. - Pulmonic valve: There was mild regurgitation. - Pulmonary  arteries: Systolic pressure was within the normal   range. - Pericardium, extracardiac: The pericardium was normal in   appearance. Impressions: - When compared to the OR TEE and 1 day day post TAVR TTE there is   are three new distinct jets of paravalvular leak, combined   attributing to mild-moderate leak. Previously one trivial jet.   The valve appears to be sitting well in the aortic position.   Transaortic gradients are unchanged and normal .   _______________  Cardiac CT 09/20/17 IMPRESSION: 1) Normal appearing position of a 26 mm Sapien 3 valve within the aortic root and annulus  2) Valve appears appropriate size for an annulus of 490 mm2  3) Normal leaflet morphology and motion with no obvious reason for commissural regurgitation seen on one month post implant TTE  4) Patent SVG;s to Diagonal, OM, and PDA.  Patent LIMA to LAD  5) Normal aortic root 3.3 cm with no trauma or dissection post procedure   __________________   2D ECHO 09/11/18 ( 1 year s/p TAVR) Study Conclusions - Left ventricle: The cavity size was normal. There was moderate   concentric hypertrophy. Systolic function was normal. The   estimated ejection fraction was in the range of 60% to 65%. Wall   motion was normal; there were no regional wall motion   abnormalities. There was an increased relative contribution of   atrial contraction to ventricular filling. Doppler parameters are   consistent with abnormal left ventricular relaxation (grade 1   diastolic dysfunction). - Aortic valve: S/P 20m EParticia LatherTAVR which appears to be   functioning normally. There is a mild perivalvular leak by   pressure halftime but visually appears mild to moderate. The mean   AVG was 8106mg. A bioprosthesis was present. There was mild   regurgitation. - Aorta: Ascending aortic diameter: 38 mm (S). - Ascending aorta: The ascending aorta was mildly dilated. - Mitral valve: There was mild regurgitation. -  Left atrium: The atrium was severely dilated. - Tricuspid valve: There was trivial regurgitation. - Pulmonic valve: There was trivial regurgitation.  ASSESSMENT & PLAN:   Severe AS s/p TAVR: doing excellent and can tell a big difference in how he feels since surgery. He has NYHA class I symptoms. Echo today shows EF 60-65%, normally functioning TAVR valve with stable, mild- mod PVL. Previous cardiac CT showed normal leaflet morphology and  motion with no obvious reason for commissural regurgitation seen on one month post implant TTE. Mean gradient 8 mm Hg. He is aware of the need for SBE prophylaxis and has Clindamycin given PCN allergy. He can stop plavix at this time and continue on ASA 31m daily indefinitely.   HTN: BP elevated today but he says he has white coat HTN and regularly checks BP at home and it's normal. No changes made   CAD s/p CABG: Pre-TAVR cath showed patent bypass grafts.  Continue medical therapy with ASA, statin and BB.  PVCs: Previously had 41% burden of PVCs.  This improved significantly on amiodarone. Followed by Dr. CCurt Bears  AAA: Pre-TAVR CT scans showed a mild fusiform aneurysmal dilatation of the infrarenal abdominal aorta.  3-year follow-up was recommended (06/2020).  I will defer this to his primary cardiologist, Dr. STamala Julian  He was supposed to see Dr. STamala Juliannext week but we have cancelled this appointment since he is doing so well and will reschedule it for 6 months.   Medication Adjustments/Labs and Tests Ordered: Current medicines are reviewed at length with the patient today.  Concerns regarding medicines are outlined above.  Medication changes, Labs and Tests ordered today are listed in the Patient Instructions below. Patient Instructions  Medication Instructions:  1) STOP PLAVIX  Labwork: None  Testing/Procedures: None  Follow-Up: Your appointment with Dr. STamala Julianhas been rescheduled to February 26, 2019 at 10:00AM.  Any Other Special Instructions Will  Be Listed Below (If Applicable).     If you need a refill on your cardiac medications before your next appointment, please call your pharmacy.      Signed, KAngelena Form PA-C  09/11/2018 3:38 PM    [facility] [address] [place]  [id number]: [phone] Fax: [phone]

## 2018-09-11 ENCOUNTER — Other Ambulatory Visit: Payer: Self-pay

## 2018-09-11 ENCOUNTER — Other Ambulatory Visit (HOSPITAL_COMMUNITY): Payer: Medicare Other

## 2018-09-11 ENCOUNTER — Encounter: Payer: Self-pay | Admitting: Physician Assistant

## 2018-09-11 ENCOUNTER — Ambulatory Visit: Payer: Medicare Other | Admitting: Physician Assistant

## 2018-09-11 ENCOUNTER — Ambulatory Visit (HOSPITAL_COMMUNITY): Payer: Medicare Other | Attending: Cardiology

## 2018-09-11 ENCOUNTER — Encounter (INDEPENDENT_AMBULATORY_CARE_PROVIDER_SITE_OTHER): Payer: Self-pay

## 2018-09-11 VITALS — BP 156/72 | HR 55 | Ht 74.0 in | Wt 181.4 lb

## 2018-09-11 DIAGNOSIS — Z952 Presence of prosthetic heart valve: Secondary | ICD-10-CM

## 2018-09-11 DIAGNOSIS — I10 Essential (primary) hypertension: Secondary | ICD-10-CM | POA: Diagnosis not present

## 2018-09-11 DIAGNOSIS — I2510 Atherosclerotic heart disease of native coronary artery without angina pectoris: Secondary | ICD-10-CM | POA: Diagnosis not present

## 2018-09-11 DIAGNOSIS — Z953 Presence of xenogenic heart valve: Secondary | ICD-10-CM | POA: Diagnosis present

## 2018-09-11 DIAGNOSIS — Z720 Tobacco use: Secondary | ICD-10-CM | POA: Diagnosis not present

## 2018-09-11 DIAGNOSIS — I080 Rheumatic disorders of both mitral and aortic valves: Secondary | ICD-10-CM | POA: Diagnosis not present

## 2018-09-11 DIAGNOSIS — I25810 Atherosclerosis of coronary artery bypass graft(s) without angina pectoris: Secondary | ICD-10-CM | POA: Diagnosis not present

## 2018-09-11 DIAGNOSIS — I129 Hypertensive chronic kidney disease with stage 1 through stage 4 chronic kidney disease, or unspecified chronic kidney disease: Secondary | ICD-10-CM | POA: Diagnosis not present

## 2018-09-11 DIAGNOSIS — E785 Hyperlipidemia, unspecified: Secondary | ICD-10-CM | POA: Diagnosis not present

## 2018-09-11 DIAGNOSIS — N189 Chronic kidney disease, unspecified: Secondary | ICD-10-CM | POA: Diagnosis not present

## 2018-09-11 DIAGNOSIS — I714 Abdominal aortic aneurysm, without rupture, unspecified: Secondary | ICD-10-CM

## 2018-09-11 DIAGNOSIS — I493 Ventricular premature depolarization: Secondary | ICD-10-CM

## 2018-09-11 MED ORDER — CLINDAMYCIN HCL 300 MG PO CAPS: ORAL_CAPSULE | ORAL | 6 refills | Status: DC

## 2018-09-11 NOTE — Patient Instructions (Signed)
Medication Instructions:  1) STOP PLAVIX  Labwork: None  Testing/Procedures: None  Follow-Up: Your appointment with Dr. Katrinka Blazing has been rescheduled to February 26, 2019 at 10:00AM.  Any Other Special Instructions Will Be Listed Below (If Applicable).     If you need a refill on your cardiac medications before your next appointment, please call your pharmacy.

## 2018-09-13 ENCOUNTER — Encounter: Payer: Self-pay | Admitting: Thoracic Surgery (Cardiothoracic Vascular Surgery)

## 2018-09-19 ENCOUNTER — Ambulatory Visit: Payer: Medicare Other | Admitting: Interventional Cardiology

## 2019-01-08 ENCOUNTER — Other Ambulatory Visit: Payer: Self-pay | Admitting: Interventional Cardiology

## 2019-01-08 ENCOUNTER — Telehealth: Payer: Self-pay | Admitting: Interventional Cardiology

## 2019-01-08 NOTE — Telephone Encounter (Signed)
1. What dental office are you calling from? Dr Dannielle Burn   2. What is your office phone number?  [phone]   3. What is your fax number?[phone]  4. What type of procedure is the patient having performed?  Cleaning   5. What date is procedure scheduled or is the patient there now? tomorrow  (if the patient is at the dentist's office question goes to their cardiologist if he/she is in the office.  If not, question should go to the DOD).   6. What is your question (ex. Antibiotics prior to procedure, holding medication-we need to know how long dentist wants pt to hold med)?  Does pt need an Antibiotic before cleaning tomorrowi

## 2019-01-08 NOTE — Telephone Encounter (Signed)
New Message   Heather from Dr. Alvester MorinBell and [facility]'s dental office calling back to talk to the nurse about the prescription issue.

## 2019-01-08 NOTE — Telephone Encounter (Signed)
Pt's pharmacy is requesting a refill on clindamycin. Would Dr. Katrinka Blazing like to refill this medication? Please address

## 2019-01-08 NOTE — Telephone Encounter (Signed)
Pt advised and Heather with dental office... Dr. Dannielle Burn.. that pt does need SBE prior to his dental cleaning tomorrow 01/09/2018...  Pt is allergic to PCN and already has prn refills at [facility] for Clindamycin 600mg . Pt instructed to call RX each time that he needs a refill.   Herbert Seta also advised that we do not typically hold ASA prior to routine cleanings.

## 2019-01-08 NOTE — Telephone Encounter (Signed)
Heather advised that I renewed the pts PRN Clindamycin RX at [facility] with 6 more refills.

## 2019-02-26 ENCOUNTER — Encounter (INDEPENDENT_AMBULATORY_CARE_PROVIDER_SITE_OTHER): Payer: Self-pay

## 2019-02-26 ENCOUNTER — Other Ambulatory Visit: Payer: Self-pay

## 2019-02-26 ENCOUNTER — Encounter: Payer: Self-pay | Admitting: Interventional Cardiology

## 2019-02-26 ENCOUNTER — Ambulatory Visit: Payer: Medicare Other | Admitting: Interventional Cardiology

## 2019-02-26 VITALS — BP 142/68 | HR 74 | Ht 74.0 in | Wt 172.6 lb

## 2019-02-26 DIAGNOSIS — N182 Chronic kidney disease, stage 2 (mild): Secondary | ICD-10-CM

## 2019-02-26 DIAGNOSIS — I10 Essential (primary) hypertension: Secondary | ICD-10-CM

## 2019-02-26 DIAGNOSIS — E785 Hyperlipidemia, unspecified: Secondary | ICD-10-CM

## 2019-02-26 DIAGNOSIS — I493 Ventricular premature depolarization: Secondary | ICD-10-CM

## 2019-02-26 DIAGNOSIS — I25810 Atherosclerosis of coronary artery bypass graft(s) without angina pectoris: Secondary | ICD-10-CM | POA: Diagnosis not present

## 2019-02-26 DIAGNOSIS — Z952 Presence of prosthetic heart valve: Secondary | ICD-10-CM | POA: Diagnosis not present

## 2019-02-26 DIAGNOSIS — E118 Type 2 diabetes mellitus with unspecified complications: Secondary | ICD-10-CM

## 2019-02-26 NOTE — Progress Notes (Signed)
Cardiology Office Note:    Date:  02/26/2019   ID:  Cory Bradley, DOB 03/05/1933, MRN [id number]  PCP:  System, Pcp Not In  Cardiologist:  Sinclair Grooms, MD   Referring MD: Bernerd Limbo, MD   Chief Complaint  Patient presents with  . Atrial Fibrillation  . Cardiac Valve Problem    History of Present Illness:    Cory Bradley is a 85 y.o. male with a hx of DMT2, HTN, HLD, CKD, CAD s/p CABG x4 (2005), PVCs (42% PVC burden previously) on amiodarone and severe AS s/p TAVR 07/2017.[name]   He is doing well.  He complains of feeling fatigued frequently.  Fatigue sensation is present even as he awakens from sleep.  He is able to carry out his typical daily activities without any recent reduction in exertional tolerance.  He denies angina, palpitations, and syncope.  No medication side effects.  No bleeding on aspirin and Plavix.  Past Medical History:  Diagnosis Date  . AAA (abdominal aortic aneurysm) (HCC)    3.3 cm infrarenal AAA 06/2017 CTA; 3 year f/u rec.  . Allergic rhinitis   . Aortic stenosis    a. Mild-mod by echo 01/2014. // b. Echo 6/18: Mod LVH EF 55-60, normal wall motion, Gr 2 DD, mod to severe AS (mean 40, peak 68; AVA by mean velocity 0.73 cm), mild AI, mod MAC, mild MR, severe LAE, mildly reduced RVSF, mod RAE  . CKD (chronic kidney disease), stage II   . Coronary artery disease    a. acute inferior MI s/p PTCA of RCA followed by CABGx4 in 2005,  . Diabetes mellitus ([place])   . ED (erectile dysfunction)   . Hyperlipidemia   . Hypertension   . Irregular heartbeat 10/2016  . Left carotid stenosis   . Memory loss   . Osteoarthritis   . Reflux   . Tobacco abuse     Past Surgical History:  Procedure Laterality Date  . APPENDECTOMY    . CORONARY ARTERY BYPASS GRAFT    . ELBOW SURGERY    . RIGHT/LEFT HEART CATH AND CORONARY/GRAFT ANGIOGRAPHY N/A 05/28/2017   Procedure: Right/Left Heart Cath and Coronary/Graft Angiography;  Surgeon: Belva Crome, MD;   Location: [facility];  Service: Cardiovascular;  Laterality: N/A;  . TEE WITHOUT CARDIOVERSION N/A 07/31/2017   Procedure: TRANSESOPHAGEAL ECHOCARDIOGRAM (TEE);  Surgeon: Burnell Blanks, MD;  Location: [place];  Service: Open Heart Surgery;  Laterality: N/A;  . TRANSCATHETER AORTIC VALVE REPLACEMENT, TRANSFEMORAL N/A 07/31/2017   Procedure: TRANSCATHETER AORTIC VALVE REPLACEMENT, TRANSFEMORAL;  Surgeon: Burnell Blanks, MD;  Location: [place];  Service: Open Heart Surgery;  Laterality: N/A;    Current Medications: Current Meds  Medication Sig  . aspirin EC 81 MG tablet Take 1 tablet (81 mg total) by mouth daily.  [name] atorvastatin (LIPITOR) 80 MG tablet Take 80 mg by mouth daily.  . clindamycin (CLEOCIN) 150 MG capsule TAKE 4 CAPSULES BY MOUTH 30-60 MINUTES BEFORE PROCEDURE  . clopidogrel (PLAVIX) 75 MG tablet Take 75 mg by mouth daily.  . Coenzyme Q10 10 MG capsule Take 100 mg by mouth daily.  . diphenhydrAMINE (BENADRYL) 25 MG tablet Take 25 mg by mouth at bedtime.   . donepezil (ARICEPT) 10 MG tablet Take 10 mg by mouth at bedtime.   . hydrochlorothiazide (MICROZIDE) 12.5 MG capsule Take 1 capsule (12.5 mg total) by mouth daily.  . irbesartan (AVAPRO) 150 MG tablet Take 1 tablet (150 mg  total) by mouth daily.  . memantine (NAMENDA) 10 MG tablet Take 10 mg by mouth 2 (two) times daily.  . metFORMIN (GLUCOPHAGE) 500 MG tablet TAKE 1 TABLET (500 MG TOTAL) BY MOUTH AT BEDTIME  . niacin (NIASPAN) 1000 MG CR tablet Take 1 tablet (1,000 mg total) by mouth at bedtime.  . Omega-3 Fatty Acids (FISH OIL) 1000 MG CAPS Take 1,000 mg by mouth 2 (two) times daily.  [name] omeprazole (PRILOSEC) 40 MG capsule Take 40 mg by mouth daily.  . propranolol ER (INDERAL LA) 120 MG 24 hr capsule Take 120 mg by mouth daily.  [name] Specialty Vitamins Products (PROSTATE) TABS Take 1 tablet by mouth daily.  . temazepam (RESTORIL) 15 MG capsule Take 15 mg by mouth at bedtime.      Allergies:   Penicillins  and Sulfa antibiotics   Social History   Socioeconomic History  . Marital status: Widowed    Spouse name: Not on file  . Number of children: 3  . Years of education: Not on file  . Highest education level: Not on file  Occupational History  . Occupation: Professor at [employer]  . Financial resource strain: Not on file  . Food insecurity:    Worry: Not on file    Inability: Not on file  . Transportation needs:    Medical: Not on file    Non-medical: Not on file  Tobacco Use  . Smoking status: Former Smoker    Packs/day: 0.50    Years: 40.00    Pack years: 20.00    Types: Cigarettes  . Smokeless tobacco: Never Used  . Tobacco comment: stopped for now, until surgery  Substance and Sexual Activity  . Alcohol use: No    Alcohol/week: 0.0 standard drinks  . Drug use: No  . Sexual activity: Not on file  Lifestyle  . Physical activity:    Days per week: Not on file    Minutes per session: Not on file  . Stress: Not on file  Relationships  . Social connections:    Talks on phone: Not on file    Gets together: Not on file    Attends religious service: Not on file    Active member of club or organization: Not on file    Attends meetings of clubs or organizations: Not on file    Relationship status: Not on file  Other Topics Concern  . Not on file  Social History Narrative  . Not on file     Family History: The patient's family history includes Heart disease in his father.  ROS:   Please see the history of present illness.    Stop wearing CPAP.  Feels fatigued.  All other systems reviewed and are negative.  EKGs/Labs/Other Studies Reviewed:    The following studies were reviewed today: 2D Doppler echocardiogram 09/11/2017: Study Conclusions  - Left ventricle: The cavity size was normal. There was moderate   concentric hypertrophy. Systolic function was normal. The   estimated ejection fraction was in the range of 60% to 65%. Wall   motion was  normal; there were no regional wall motion   abnormalities. There was an increased relative contribution of   atrial contraction to ventricular filling. Doppler parameters are   consistent with abnormal left ventricular relaxation (grade 1   diastolic dysfunction). - Aortic valve: S/P 91m EParticia LatherTAVR which appears to be   functioning normally. There is a mild perivalvular leak by   pressure halftime  but visually appears mild to moderate. The mean   AVG was 33mHg. A bioprosthesis was present. There was mild   regurgitation. - Aorta: Ascending aortic diameter: 38 mm (S). - Ascending aorta: The ascending aorta was mildly dilated. - Mitral valve: There was mild regurgitation. - Left atrium: The atrium was severely dilated. - Tricuspid valve: There was trivial regurgitation. - Pulmonic valve: There was trivial regurgitation.   EKG:  EKG sinus rhythm, left axis deviation, ventricular trigeminy, left atrial abnormality, incomplete right bundle, and in comparison to the prior EKG performed in August 2018, no significant change has occurred.  Recent Labs: 03/14/2018: ALT 19; Hemoglobin 12.3; Platelets 141 03/28/2018: NT-Pro BNP 318 04/15/2018: BUN 24; Creatinine, Ser 1.52; Potassium 4.0; Sodium 142  Recent Lipid Panel    Component Value Date/Time   CHOL  11/07/2007 0305    88        ATP III CLASSIFICATION:  <200     mg/dL   Desirable  200-239  mg/dL   Borderline High  >=240    mg/dL   High   TRIG 49 11/07/2007 0305   HDL 41 11/07/2007 0305   CHOLHDL 2.1 11/07/2007 0305   VLDL 10 11/07/2007 0305   LDLCALC  11/07/2007 0305    37        Total Cholesterol/HDL:CHD Risk Coronary Heart Disease Risk Table                     Men   Women  1/2 Average Risk   3.4   3.3    Physical Exam:    VS:  BP (!) 142/68   Pulse 74   Ht _0  (1.88 m)   Wt 172 lb 9.6 oz (78.3 kg)   SpO2 97%   BMI 22.16 kg/m     Wt Readings from Last 3 Encounters:  02/26/19 172 lb 9.6 oz (78.3 kg)   09/11/18 181 lb 6.4 oz (82.3 kg)  05/21/18 181 lb 11.2 oz (82.4 kg)     GEN: Elderly.. No acute distress HEENT: Normal NECK: No JVD. LYMPHATICS: No lymphadenopathy CARDIAC: Irregular RR.  2/6 right upper sternal border systolic without diastolic murmur, no gallop, no edema VASCULAR: 2+ bilateral radial pulses, no bruits RESPIRATORY:  Clear to auscultation without rales, wheezing or rhonchi  ABDOMEN: Soft, non-tender, non-distended, No pulsatile mass, MUSCULOSKELETAL: No deformity  SKIN: Warm and dry NEUROLOGIC:  Alert and oriented x 3 PSYCHIATRIC:  Normal affect   ASSESSMENT:    1. S/P TAVR (transcatheter aortic valve replacement)   2. Coronary artery disease involving coronary bypass graft of native heart without angina pectoris   3. Essential hypertension   4. CKD (chronic kidney disease), stage II   5. Hyperlipidemia, unspecified hyperlipidemia type   6. PVC's (premature ventricular contractions)   7. Type 2 diabetes mellitus with complication, without long-term current use of insulin (HCC)    PLAN:    In order of problems listed above:  1. Normally functioning TAVR valve based upon clinical exam and relatively recent echocardiogram. 2. No anginal complaints currently.  Secondary prevention is reviewed. 3. Continue current regimen with limitations on salt.  Target blood pressure 130/80 mmHg. 4. Stage III CKD, stable with most recent creatinine 1.52 in April 2019 5. LDL target less than 70.  I do not have a recent LDL level.  He is on atorvastatin 80 mg/day.  Liver tests in March 2019 were normal. 6. Still present on EKG. 7. Hemoglobin A1c target less than  7.  Most recent was 5.5 in March 2018  Overall education and awareness concerning primary/secondary risk prevention was discussed in detail: LDL less than 70, hemoglobin A1c less than 7, blood pressure target less than 130/80 mmHg, >150 minutes of moderate aerobic activity per week, avoidance of smoking, weight control  (via diet and exercise), and continued surveillance/management of/for obstructive sleep apnea.  1 year follow-up   Medication Adjustments/Labs and Tests Ordered: Current medicines are reviewed at length with the patient today.  Concerns regarding medicines are outlined above.  Orders Placed This Encounter  Procedures  . EKG 12-Lead   No orders of the defined types were placed in this encounter.   There are no Patient Instructions on file for this visit.   Signed, Sinclair Grooms, MD  02/26/2019 10:55 AM    [place]

## 2019-02-26 NOTE — Patient Instructions (Signed)

## 2019-02-26 NOTE — Addendum Note (Signed)
Addended by: Julio Sicks on: 02/26/2019 11:16 AM   Modules accepted: Orders

## 2019-06-03 ENCOUNTER — Other Ambulatory Visit: Payer: Self-pay | Admitting: Interventional Cardiology

## 2019-06-26 ENCOUNTER — Telehealth: Payer: Self-pay | Admitting: Interventional Cardiology

## 2019-06-26 NOTE — Telephone Encounter (Signed)
Left message for pt to call back.    He needs to be scheduled for fasting labs at his convenience.  Orders are in.

## 2019-06-27 NOTE — Telephone Encounter (Signed)
Pt called back yesterday and scheduled labs for 7/14

## 2019-07-01 ENCOUNTER — Other Ambulatory Visit: Payer: Self-pay

## 2019-07-01 ENCOUNTER — Other Ambulatory Visit: Payer: Medicare Other

## 2019-07-01 DIAGNOSIS — E785 Hyperlipidemia, unspecified: Secondary | ICD-10-CM

## 2019-07-01 LAB — HEPATIC FUNCTION PANEL
ALT: 17 IU/L (ref 0–44)
AST: 23 IU/L (ref 0–40)
Albumin: 4.1 g/dL (ref 3.6–4.6)
Alkaline Phosphatase: 49 IU/L (ref 39–117)
Bilirubin Total: 0.4 mg/dL (ref 0.0–1.2)
Bilirubin, Direct: 0.17 mg/dL (ref 0.00–0.40)
Total Protein: 5.8 g/dL — ABNORMAL LOW (ref 6.0–8.5)

## 2019-07-01 LAB — LIPID PANEL
Chol/HDL Ratio: 1.8 ratio (ref 0.0–5.0)
Cholesterol, Total: 126 mg/dL (ref 100–199)
HDL: 69 mg/dL (ref >39)
LDL Calculated: 47 mg/dL (ref 0–99)
Triglycerides: 49 mg/dL (ref 0–149)
VLDL Cholesterol Cal: 10 mg/dL (ref 5–40)

## 2019-07-02 ENCOUNTER — Telehealth: Payer: Self-pay

## 2019-07-02 NOTE — Telephone Encounter (Signed)
Left message for patient to call office for results. °

## 2019-07-02 NOTE — Telephone Encounter (Signed)
-----   Message from Nuala Alpha, LPN sent at 3/33/8329 12:18 PM EDT -----  ----- Message ----- From: Belva Crome, MD Sent: 07/01/2019   4:44 PM EDT To: Pcp Not In System, Loren Racer, LPN  Let the patient know the labs are excellent. Will discuss at [place]. A copy will be sent to System, Pcp Not In

## 2019-07-07 ENCOUNTER — Telehealth: Payer: Self-pay | Admitting: Interventional Cardiology

## 2019-07-07 ENCOUNTER — Encounter: Payer: Self-pay | Admitting: *Deleted

## 2019-07-07 NOTE — Telephone Encounter (Signed)
New Message   Patient is returni

## 2019-07-07 NOTE — Telephone Encounter (Signed)
Disregard opened in error °

## 2019-07-07 NOTE — Telephone Encounter (Signed)
Pt has been notified of lab results by phone with verbal understanding. I stated to the pt we just sent out a letter to call for his results so he can disregard the letter. Pt thanked me for the call. He states he thinks he is supposed to see Dr. Smith sometime soon. I advised pt I will send a message to his nurse Jennifer B., RN to reach about appt.  

## 2019-07-07 NOTE — Telephone Encounter (Signed)
Follow up  ° ° °Patient is returning call in reference to lab results. Please call  °

## 2019-07-07 NOTE — Telephone Encounter (Signed)
Pt has been notified of lab results by phone with verbal understanding. I stated to the pt we just sent out a letter to call for his results so he can disregard the letter. Pt thanked me for the call. He states he thinks he is supposed to see Dr. Tamala Julian sometime soon. I advised pt I will send a message to his nurse Marveen Reeks., RN to reach about appt.

## 2020-01-01 ENCOUNTER — Other Ambulatory Visit: Payer: Self-pay | Admitting: Interventional Cardiology

## 2020-01-09 ENCOUNTER — Encounter (HOSPITAL_COMMUNITY): Payer: Self-pay

## 2020-01-09 ENCOUNTER — Other Ambulatory Visit: Payer: Self-pay

## 2020-01-09 ENCOUNTER — Inpatient Hospital Stay (HOSPITAL_COMMUNITY)
Admission: EM | Admit: 2020-01-09 | Discharge: 2020-01-11 | DRG: 281 | Disposition: A | Discharge status: Home / Self Care | Payer: Medicare PPO | Attending: Internal Medicine | Admitting: Internal Medicine

## 2020-01-09 ENCOUNTER — Emergency Department (HOSPITAL_COMMUNITY): Payer: Medicare PPO

## 2020-01-09 DIAGNOSIS — Z7984 Long term (current) use of oral hypoglycemic drugs: Secondary | ICD-10-CM

## 2020-01-09 DIAGNOSIS — E1165 Type 2 diabetes mellitus with hyperglycemia: Secondary | ICD-10-CM | POA: Diagnosis present

## 2020-01-09 DIAGNOSIS — R55 Syncope and collapse: Secondary | ICD-10-CM | POA: Diagnosis not present

## 2020-01-09 DIAGNOSIS — N179 Acute kidney failure, unspecified: Secondary | ICD-10-CM

## 2020-01-09 DIAGNOSIS — I129 Hypertensive chronic kidney disease with stage 1 through stage 4 chronic kidney disease, or unspecified chronic kidney disease: Secondary | ICD-10-CM | POA: Diagnosis present

## 2020-01-09 DIAGNOSIS — N183 Chronic kidney disease, stage 3 unspecified: Secondary | ICD-10-CM | POA: Diagnosis present

## 2020-01-09 DIAGNOSIS — I10 Essential (primary) hypertension: Secondary | ICD-10-CM | POA: Diagnosis present

## 2020-01-09 DIAGNOSIS — Z8249 Family history of ischemic heart disease and other diseases of the circulatory system: Secondary | ICD-10-CM

## 2020-01-09 DIAGNOSIS — I214 Non-ST elevation (NSTEMI) myocardial infarction: Principal | ICD-10-CM

## 2020-01-09 DIAGNOSIS — I714 Abdominal aortic aneurysm, without rupture: Secondary | ICD-10-CM | POA: Diagnosis present

## 2020-01-09 DIAGNOSIS — I2510 Atherosclerotic heart disease of native coronary artery without angina pectoris: Secondary | ICD-10-CM | POA: Diagnosis present

## 2020-01-09 DIAGNOSIS — I471 Supraventricular tachycardia: Secondary | ICD-10-CM | POA: Diagnosis present

## 2020-01-09 DIAGNOSIS — Z951 Presence of aortocoronary bypass graft: Secondary | ICD-10-CM

## 2020-01-09 DIAGNOSIS — Z882 Allergy status to sulfonamides status: Secondary | ICD-10-CM

## 2020-01-09 DIAGNOSIS — N210 Calculus in bladder: Secondary | ICD-10-CM | POA: Diagnosis present

## 2020-01-09 DIAGNOSIS — Z20822 Contact with and (suspected) exposure to covid-19: Secondary | ICD-10-CM | POA: Diagnosis present

## 2020-01-09 DIAGNOSIS — Z87891 Personal history of nicotine dependence: Secondary | ICD-10-CM

## 2020-01-09 DIAGNOSIS — E1151 Type 2 diabetes mellitus with diabetic peripheral angiopathy without gangrene: Secondary | ICD-10-CM | POA: Diagnosis present

## 2020-01-09 DIAGNOSIS — I493 Ventricular premature depolarization: Secondary | ICD-10-CM | POA: Diagnosis present

## 2020-01-09 DIAGNOSIS — N182 Chronic kidney disease, stage 2 (mild): Secondary | ICD-10-CM | POA: Diagnosis present

## 2020-01-09 DIAGNOSIS — Z880 Allergy status to penicillin: Secondary | ICD-10-CM

## 2020-01-09 DIAGNOSIS — I252 Old myocardial infarction: Secondary | ICD-10-CM

## 2020-01-09 DIAGNOSIS — R739 Hyperglycemia, unspecified: Secondary | ICD-10-CM | POA: Diagnosis present

## 2020-01-09 DIAGNOSIS — E785 Hyperlipidemia, unspecified: Secondary | ICD-10-CM | POA: Diagnosis present

## 2020-01-09 DIAGNOSIS — Z952 Presence of prosthetic heart valve: Secondary | ICD-10-CM

## 2020-01-09 DIAGNOSIS — Z7902 Long term (current) use of antithrombotics/antiplatelets: Secondary | ICD-10-CM

## 2020-01-09 DIAGNOSIS — I350 Nonrheumatic aortic (valve) stenosis: Secondary | ICD-10-CM | POA: Diagnosis present

## 2020-01-09 DIAGNOSIS — N529 Male erectile dysfunction, unspecified: Secondary | ICD-10-CM | POA: Diagnosis present

## 2020-01-09 DIAGNOSIS — R197 Diarrhea, unspecified: Secondary | ICD-10-CM | POA: Diagnosis present

## 2020-01-09 DIAGNOSIS — E1122 Type 2 diabetes mellitus with diabetic chronic kidney disease: Secondary | ICD-10-CM | POA: Diagnosis present

## 2020-01-09 DIAGNOSIS — Z7982 Long term (current) use of aspirin: Secondary | ICD-10-CM

## 2020-01-09 DIAGNOSIS — E7800 Pure hypercholesterolemia, unspecified: Secondary | ICD-10-CM | POA: Diagnosis present

## 2020-01-09 DIAGNOSIS — Z79899 Other long term (current) drug therapy: Secondary | ICD-10-CM

## 2020-01-09 DIAGNOSIS — I959 Hypotension, unspecified: Secondary | ICD-10-CM

## 2020-01-09 DIAGNOSIS — J309 Allergic rhinitis, unspecified: Secondary | ICD-10-CM | POA: Diagnosis present

## 2020-01-09 DIAGNOSIS — R002 Palpitations: Secondary | ICD-10-CM | POA: Diagnosis present

## 2020-01-09 DIAGNOSIS — F0390 Unspecified dementia without behavioral disturbance: Secondary | ICD-10-CM | POA: Diagnosis present

## 2020-01-09 LAB — CBC WITH DIFFERENTIAL/PLATELET
Abs Immature Granulocytes: 0.02 10*3/uL (ref 0.00–0.07)
Basophils Absolute: 0.1 10*3/uL (ref 0.0–0.1)
Basophils Relative: 1 %
Eosinophils Absolute: 0.2 10*3/uL (ref 0.0–0.5)
Eosinophils Relative: 3 %
HCT: 39.2 % (ref 39.0–52.0)
Hemoglobin: 12.9 g/dL — ABNORMAL LOW (ref 13.0–17.0)
Immature Granulocytes: 0 %
Lymphocytes Relative: 18 %
Lymphs Abs: 1.4 10*3/uL (ref 0.7–4.0)
MCH: 31.8 pg (ref 26.0–34.0)
MCHC: 32.9 g/dL (ref 30.0–36.0)
MCV: 96.6 fL (ref 80.0–100.0)
Monocytes Absolute: 0.4 10*3/uL (ref 0.1–1.0)
Monocytes Relative: 6 %
Neutro Abs: 5.5 10*3/uL (ref 1.7–7.7)
Neutrophils Relative %: 72 %
Platelets: 155 10*3/uL (ref 150–400)
RBC: 4.06 MIL/uL — ABNORMAL LOW (ref 4.22–5.81)
RDW: 13 % (ref 11.5–15.5)
WBC: 7.6 10*3/uL (ref 4.0–10.5)
nRBC: 0 % (ref 0.0–0.2)

## 2020-01-09 LAB — TROPONIN I (HIGH SENSITIVITY)
Troponin I (High Sensitivity): 38 ng/L — ABNORMAL HIGH (ref <18)
Troponin I (High Sensitivity): 164 ng/L (ref <18)

## 2020-01-09 LAB — LACTIC ACID, PLASMA: Lactic Acid, Venous: 1.6 mmol/L (ref 0.5–1.9)

## 2020-01-09 LAB — COMPREHENSIVE METABOLIC PANEL
ALT: 21 U/L (ref 0–44)
AST: 23 U/L (ref 15–41)
Albumin: 3.7 g/dL (ref 3.5–5.0)
Alkaline Phosphatase: 50 U/L (ref 38–126)
Anion gap: 8 (ref 5–15)
BUN: 20 mg/dL (ref 8–23)
CO2: 24 mmol/L (ref 22–32)
Calcium: 8.6 mg/dL — ABNORMAL LOW (ref 8.9–10.3)
Chloride: 105 mmol/L (ref 98–111)
Creatinine, Ser: 1.94 mg/dL — ABNORMAL HIGH (ref 0.61–1.24)
GFR calc Af Amer: 36 mL/min — ABNORMAL LOW (ref >60)
GFR calc non Af Amer: 31 mL/min — ABNORMAL LOW (ref >60)
Glucose, Bld: 203 mg/dL — ABNORMAL HIGH (ref 70–99)
Potassium: 3.5 mmol/L (ref 3.5–5.1)
Sodium: 137 mmol/L (ref 135–145)
Total Bilirubin: 0.6 mg/dL (ref 0.3–1.2)
Total Protein: 6.2 g/dL — ABNORMAL LOW (ref 6.5–8.1)

## 2020-01-09 LAB — CBG MONITORING, ED: Glucose-Capillary: 155 mg/dL — ABNORMAL HIGH (ref 70–99)

## 2020-01-09 MED ORDER — SODIUM CHLORIDE 0.9 % IV BOLUS
1000.0000 mL | Freq: Once | INTRAVENOUS | Status: AC
  Administered 2020-01-09: 1000 mL via INTRAVENOUS

## 2020-01-09 NOTE — ED Provider Notes (Signed)
[facility] EMERGENCY DEPARTMENT Provider Note   CSN: [id number] Arrival date & time: 01/09/20  2124     History Chief Complaint  Patient presents with  . Near Syncope    Cory Bradley is a 86 y.o. male history of AAA, CKD, CAD, diabetes, ED, hyperlipidemia, hypertension brought in by EMS for evaluation of generalized weakness, SOB, near syncope.  He states he ate dinner and was doing fine until about 30 minutes later, he felt acute onset of generalized weakness associated with diaphoresis.  He states he felt like he was going to pass out.  He had one episode of nonbloody diarrhea and was concerned that he was going to lose consciousness so he called EMS.  He states he also felt like his pulse was throbbing and was going real fast.  He states he never had any chest pain at the time.  On EMS arrival, he was noted to be hypotensive with systolic blood pressure range in the 80s.  He was given small bolus of fluid which improved into the low 100s.  He states that the episode lasted from about 7 to about 8:00 when EMS came.  He now reports feeling completely better.  He states that he has not been sick recently and denies any recent fevers, chills.  He states he has not had any difficulty breathing, abdominal pain, nausea/vomiting.  No recent travel or known COVID-19 exposure.  The history is provided by the patient.    HPI: A 86 year old patient with a history of peripheral artery disease, treated diabetes, hypertension and hypercholesterolemia presents for evaluation of chest pain. Initial onset of pain was approximately 3-6 hours ago. The patient's chest pain is not worse with exertion. The patient reports some diaphoresis. The patient's chest pain is not middle- or left-sided, is not well-localized, is not described as heaviness/pressure/tightness, is not sharp and does not radiate to the arms/jaw/neck. The patient does not complain of nausea. The patient has no history of  stroke, has not smoked in the past 90 days, has no relevant family history of coronary artery disease (first degree relative at less than age 60) and does not have an elevated BMI (>=30).   Past Medical History:  Diagnosis Date  . AAA (abdominal aortic aneurysm) (HCC)    3.3 cm infrarenal AAA 06/2017 CTA; 3 year f/u rec.  . Allergic rhinitis   . Aortic stenosis    a. Mild-mod by echo 01/2014. // b. Echo 6/18: Mod LVH EF 55-60, normal wall motion, Gr 2 DD, mod to severe AS (mean 40, peak 68; AVA by mean velocity 0.73 cm), mild AI, mod MAC, mild MR, severe LAE, mildly reduced RVSF, mod RAE  . CKD (chronic kidney disease), stage II   . Coronary artery disease    a. acute inferior MI s/p PTCA of RCA followed by CABGx4 in 2005,  . Diabetes mellitus ([place])   . ED (erectile dysfunction)   . Hyperlipidemia   . Hypertension   . Irregular heartbeat 10/2016  . Left carotid stenosis   . Memory loss   . Osteoarthritis   . Reflux   . Tobacco abuse     Patient Active Problem List   Diagnosis Date Noted  . Non-STEMI (non-ST elevated myocardial infarction) ([place]) 01/10/2020  . AKI (acute kidney injury) ([place]) 01/10/2020  . Near syncope 01/10/2020  . Hyperglycemia 01/10/2020  . 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 Dyspnea on exertion 05/26/2017  . CAD (  coronary artery disease) 05/16/2017  . SVT (supraventricular tachycardia) ([place]) 05/16/2017  . Carotid artery disease ([place]) 05/16/2017  . Aortic stenosis   . CKD (chronic kidney disease), stage II   . Cigarette smoker   . Essential hypertension   . Hyperlipidemia     Past Surgical History:  Procedure Laterality Date  . APPENDECTOMY    . CORONARY ARTERY BYPASS GRAFT    . ELBOW SURGERY    . RIGHT/LEFT HEART CATH AND CORONARY/GRAFT ANGIOGRAPHY N/A 05/28/2017   Procedure: Right/Left Heart Cath and Coronary/Graft Angiography;  Surgeon: Belva Crome, MD;  Location: [facility];  Service:  Cardiovascular;  Laterality: N/A;  . TEE WITHOUT CARDIOVERSION N/A 07/31/2017   Procedure: TRANSESOPHAGEAL ECHOCARDIOGRAM (TEE);  Surgeon: Burnell Blanks, MD;  Location: [facility];  Service: Open Heart Surgery;  Laterality: N/A;  . TRANSCATHETER AORTIC VALVE REPLACEMENT, TRANSFEMORAL N/A 07/31/2017   Procedure: TRANSCATHETER AORTIC VALVE REPLACEMENT, TRANSFEMORAL;  Surgeon: Burnell Blanks, MD;  Location: [facility];  Service: Open Heart Surgery;  Laterality: N/A;       Family History  Problem Relation Age of Onset  . Heart disease Father     Social History   Tobacco Use  . Smoking status: Former Smoker    Packs/day: 0.20    Years: 40.00    Pack years: 8.00    Types: Cigarettes  . Smokeless tobacco: Never Used  Substance Use Topics  . Alcohol use: No    Alcohol/week: 0.0 standard drinks  . Drug use: No    Home Medications Prior to Admission medications   Medication Sig Start Date End Date Taking? Authorizing Provider  aspirin EC 81 MG tablet Take 1 tablet (81 mg total) by mouth daily. 05/16/17   Richardson Dopp T, PA-C  atorvastatin (LIPITOR) 80 MG tablet Take 80 mg by mouth daily.    [provider]  clindamycin (CLEOCIN) 150 MG capsule TAKE 4 CAPSULES BY MOUTH 30-60 MINUTES BEFORE PROCEDURE 01/08/19   Eileen Stanford, PA-C  clopidogrel (PLAVIX) 75 MG tablet Take 1 tablet (75 mg total) by mouth daily. Please make yearly appt with Dr. Tamala Julian for March before anymore refills. 1st attempt 01/01/20   Belva Crome, MD  Coenzyme Q10 10 MG capsule Take 100 mg by mouth daily.    [provider]  diphenhydrAMINE (BENADRYL) 25 MG tablet Take 25 mg by mouth at bedtime.     [provider]  donepezil (ARICEPT) 10 MG tablet Take 10 mg by mouth at bedtime.     [provider]  hydrochlorothiazide (MICROZIDE) 12.5 MG capsule Take 1 capsule (12.5 mg total) by mouth daily. 03/21/18 03/16/19  Belva Crome, MD  irbesartan (AVAPRO) 150 MG tablet Take 1  tablet (150 mg total) by mouth daily. 06/14/17   Tanda Rockers, MD  memantine (NAMENDA) 10 MG tablet Take 10 mg by mouth 2 (two) times daily.    [provider]  metFORMIN (GLUCOPHAGE) 500 MG tablet TAKE 1 TABLET (500 MG TOTAL) BY MOUTH AT BEDTIME 08/10/15   [provider]  niacin (NIASPAN) 1000 MG CR tablet Take 1 tablet (1,000 mg total) by mouth at bedtime. 08/10/17   Lyda Jester M, PA-C  Omega-3 Fatty Acids (FISH OIL) 1000 MG CAPS Take 1,000 mg by mouth 2 (two) times daily.    [provider]  omeprazole (PRILOSEC) 40 MG capsule Take 40 mg by mouth daily.    [provider]  propranolol ER (INDERAL LA) 120 MG 24  hr capsule Take 120 mg by mouth daily.    [provider]  Specialty Vitamins Products (PROSTATE) TABS Take 1 tablet by mouth daily.    [provider]  temazepam (RESTORIL) 15 MG capsule Take 15 mg by mouth at bedtime.  09/11/16   [provider]    Allergies    Penicillins and Sulfa antibiotics  Review of Systems   Review of Systems  Constitutional: Positive for diaphoresis and fatigue. Negative for fever.  Respiratory: Positive for shortness of breath. Negative for cough.   Cardiovascular: Negative for chest pain.  Gastrointestinal: Positive for diarrhea. Negative for abdominal pain, nausea and vomiting.  Genitourinary: Negative for dysuria and hematuria.  Neurological: Positive for weakness (generalized). Negative for headaches.  Hematological: Negative for adenopathy.  All other systems reviewed and are negative.   Physical Exam Updated Vital Signs BP (!) 140/58 (BP Location: Right Arm)   Pulse 66   Temp 98.3 F (36.8 C) (Oral)   Resp 20   Ht _0  (1.905 m)   Wt 76.9 kg   SpO2 94%   BMI 21.20 kg/m   Physical Exam Vitals and nursing note reviewed.  Constitutional:      Appearance: Normal appearance. He is well-developed.  HENT:     Head: Normocephalic and atraumatic.  Eyes:     General:  Lids are normal.     Conjunctiva/sclera: Conjunctivae normal.     Pupils: Pupils are equal, round, and reactive to light.  Cardiovascular:     Rate and Rhythm: Normal rate and regular rhythm.     Pulses: Normal pulses.          Radial pulses are 2+ on the right side and 2+ on the left side.       Dorsalis pedis pulses are 2+ on the right side.     Heart sounds: Normal heart sounds. No murmur. No friction rub. No gallop.   Pulmonary:     Effort: Pulmonary effort is normal.     Breath sounds: Normal breath sounds.     Comments: Lungs clear to auscultation bilaterally.  Symmetric chest rise.  No wheezing, rales, rhonchi. Abdominal:     Palpations: Abdomen is soft. Abdomen is not rigid.     Tenderness: There is no abdominal tenderness. There is no guarding.     Comments: Abdomen is soft, non-distended, non-tender. No rigidity, No guarding. No peritoneal signs.  Musculoskeletal:        General: Normal range of motion.     Cervical back: Full passive range of motion without pain.  Skin:    General: Skin is warm and dry.     Capillary Refill: Capillary refill takes less than 2 seconds.  Neurological:     Mental Status: He is alert and oriented to person, place, and time.     Comments: Cranial nerves III-XII intact Follows commands, Moves all extremities  5/5 strength to BUE and BLE  Sensation intact throughout all major nerve distribution No slurred speech. No facial droop.   Psychiatric:        Speech: Speech normal.     ED Results / Procedures / Treatments   Labs (all labs ordered are listed, but only abnormal results are displayed) Labs Reviewed  COMPREHENSIVE METABOLIC PANEL - Abnormal; Notable for the following components:      Result Value   Glucose, Bld 203 (*)    Creatinine, Ser 1.94 (*)    Calcium 8.6 (*)    Total Protein 6.2 (*)  GFR calc non Af Amer 31 (*)    GFR calc Af Amer 36 (*)    All other components within normal limits  CBC WITH DIFFERENTIAL/PLATELET -  Abnormal; Notable for the following components:   RBC 4.06 (*)    Hemoglobin 12.9 (*)    All other components within normal limits  URINALYSIS, ROUTINE W REFLEX MICROSCOPIC - Abnormal; Notable for the following components:   APPearance HAZY (*)    Hgb urine dipstick SMALL (*)    Protein, ur 30 (*)    Bacteria, UA RARE (*)    All other components within normal limits  COMPREHENSIVE METABOLIC PANEL - Abnormal; Notable for the following components:   Creatinine, Ser 1.84 (*)    Calcium 8.2 (*)    Total Protein 5.2 (*)    Albumin 3.1 (*)    GFR calc non Af Amer 33 (*)    GFR calc Af Amer 38 (*)    All other components within normal limits  CBC WITH DIFFERENTIAL/PLATELET - Abnormal; Notable for the following components:   RBC 3.56 (*)    Hemoglobin 11.4 (*)    HCT 34.0 (*)    Platelets 137 (*)    All other components within normal limits  CBG MONITORING, ED - Abnormal; Notable for the following components:   Glucose-Capillary 155 (*)    All other components within normal limits  TROPONIN I (HIGH SENSITIVITY) - Abnormal; Notable for the following components:   Troponin I (High Sensitivity) 38 (*)    All other components within normal limits  TROPONIN I (HIGH SENSITIVITY) - Abnormal; Notable for the following components:   Troponin I (High Sensitivity) 164 (*)    All other components within normal limits  TROPONIN I (HIGH SENSITIVITY) - Abnormal; Notable for the following components:   Troponin I (High Sensitivity) 790 (*)    All other components within normal limits  TROPONIN I (HIGH SENSITIVITY) - Abnormal; Notable for the following components:   Troponin I (High Sensitivity) 777 (*)    All other components within normal limits  SARS CORONAVIRUS 2 (TAT 6-24 HRS)  LACTIC ACID, PLASMA  LACTIC ACID, PLASMA  HEPARIN LEVEL (UNFRACTIONATED)  TSH  CBC  HEPARIN LEVEL (UNFRACTIONATED)  CBG MONITORING, ED    EKG EKG Interpretation  Date/Time:  Friday January 09 2020 21:26:02  EST Ventricular Rate:  67 PR Interval:    QRS Duration: 109 QT Interval:  460 QTC Calculation: 486 R Axis:   -66 Text Interpretation: Sinus rhythm Abnormal R-wave progression, late transition Inferior infarct, old since last tracing no significant change Confirmed by Malvin Johns [phone]) on 01/09/2020 9:41:59 PM   Radiology CT ABDOMEN PELVIS WO CONTRAST  Result Date: 01/10/2020 CLINICAL DATA:  AAA. Diarrhea and nausea for 2 hours. EXAM: CT ABDOMEN AND PELVIS WITHOUT CONTRAST TECHNIQUE: Multidetector CT imaging of the abdomen and pelvis was performed following the standard protocol without IV contrast. COMPARISON:  CT dated 04/11/2017. FINDINGS: Lower chest: The lung bases are clear. The heart size is normal. Hepatobiliary: The liver is normal. Cholelithiasis without acute inflammation.There is no biliary ductal dilation. Pancreas: Normal contours without ductal dilatation. No peripancreatic fluid collection. Spleen: No splenic laceration or hematoma. Adrenals/Urinary Tract: --Adrenal glands: No adrenal hemorrhage. --Right kidney/ureter: There is a nonobstructing stone in the lower pole the right kidney measuring approximately 8 mm. There is no right-sided hydronephrosis. There is severe cortical thinning of the lower pole the right kidney. --Left kidney/ureter: No hydronephrosis or perinephric hematoma. --Urinary bladder: There is  a large bladder stone in the dependent portion of the urinary bladder measuring approximately 1.9 cm. Stomach/Bowel: --Stomach/Duodenum: There is a large hiatal hernia. --Small bowel: No dilatation or inflammation. --Colon: There is near pancolonic diverticulosis without CT evidence for diverticulitis. --Appendix: Normal. Vascular/Lymphatic: There are advanced atherosclerotic changes of the abdominal aorta. There is an infrarenal abdominal aortic aneurysm measuring approximately 3.6 cm on the sagittal series (series 7, image 103). --No retroperitoneal lymphadenopathy. --No  mesenteric lymphadenopathy. --No pelvic or inguinal lymphadenopathy. Reproductive: The prostate gland is enlarged. Other: There is a fat containing right inguinal hernia. The abdominal wall is normal. Musculoskeletal. There is a chronic appearing compression fracture of the T10 vertebral body. Degenerative changes are noted throughout the visualized thoracolumbar spine. IMPRESSION: 1. There is an infrarenal abdominal aortic aneurysm measuring approximately 3.6 cm. Recommend followup by ultrasound in 2 years. This recommendation follows ACR consensus guidelines: White Paper of the ACR Incidental Findings Committee II on Vascular Findings. J Am Coll Radiol 2013; 10:789-794. Aortic aneurysm NOS (ICD10-I71.9) 2. There is a large bladder stone measuring approximately 1.9 cm. 3. There is near pancolonic diverticulosis without CT evidence for diverticulitis. 4. There is a nonobstructing 8 mm stone in the lower pole the right kidney. 5. There is cholelithiasis without secondary signs of acute cholecystitis. 6. Prostatomegaly. 7. Large hiatal hernia. Aortic Atherosclerosis (ICD10-I70.0). Electronically Signed   By: Constance Holster M.D.   On: 01/10/2020 00:02   DG Chest Portable 1 View  Result Date: 01/09/2020 CLINICAL DATA:  Syncope. EXAM: PORTABLE CHEST 1 VIEW COMPARISON:  July 31, 2017 FINDINGS: Multiple sternal wires and vascular clips are seen. There is no evidence of acute infiltrate, pleural effusion or pneumothorax. The heart size and mediastinal contours are within normal limits. An artificial aortic valve is noted. Multilevel degenerative changes seen throughout the thoracic spine. IMPRESSION: 1. Evidence of prior median sternotomy. 2. Artificial aortic valve. 3. No acute or active cardiopulmonary disease. Electronically Signed   By: Virgina Norfolk M.D.   On: 01/09/2020 22:16    Procedures Procedures (including critical care time)  Medications Ordered in ED Medications  heparin ADULT infusion 100  units/mL (25000 units/225m sodium chloride 0.45%) (900 Units/hr Intravenous Rate/Dose Verify 01/10/20 0906)  aspirin EC tablet 81 mg (81 mg Oral Given 01/10/20 0902)  atorvastatin (LIPITOR) tablet 80 mg (80 mg Oral Given 01/10/20 0902)  niacin (NIASPAN) CR tablet 1,000 mg (has no administration in time range)  propranolol ER (INDERAL LA) 24 hr capsule 120 mg (has no administration in time range)  donepezil (ARICEPT) tablet 10 mg (has no administration in time range)  memantine (NAMENDA) tablet 10 mg (10 mg Oral Given 01/10/20 0903)  temazepam (RESTORIL) capsule 15 mg (has no administration in time range)  pantoprazole (PROTONIX) EC tablet 40 mg (40 mg Oral Given 01/10/20 0903)  clopidogrel (PLAVIX) tablet 75 mg (75 mg Oral Given 01/10/20 0902)  omega-3 acid ethyl esters (LOVAZA) capsule 1 g (1 g Oral Given 01/10/20 0902)  acetaminophen (TYLENOL) tablet 650 mg (has no administration in time range)    Or  acetaminophen (TYLENOL) suppository 650 mg (has no administration in time range)  insulin aspart (novoLOG) injection 0-9 Units (0 Units Subcutaneous Not Given 01/10/20 1234)  0.9 %  sodium chloride infusion ( Intravenous New Bag/Given 01/10/20 0409)  sodium chloride 0.9 % bolus 1,000 mL (0 mLs Intravenous Stopped 01/09/20 2321)  aspirin chewable tablet 324 mg (324 mg Oral Given 01/10/20 0038)  heparin bolus via infusion 3,000 Units (3,000 Units Intravenous Bolus  from [place] 01/10/20 0107)    ED Course  I have reviewed the triage vital signs and the nursing notes.  Pertinent labs & imaging results that were available during my care of the patient were reviewed by me and considered in my medical decision making (see chart for details).    MDM Rules/Calculators/A&P HEAR Score: 14                    86 year old male who presents for evaluation near syncope that occurred earlier this evening.  Reports generalized weakness, diarrhea.  No chest pain but felt like he was going to pass out.  EMS noted to be  hypotensive on arrival.  He was given 300 cc which improved his blood pressure into the 633H systolically.  Patient reported feeling better.  On ED arrival, he states that symptoms have improved.  On initial ED arrival, he is afebrile but is still hypotensive with systolic ranging between 54T and 90s.  He denies any chest pain or abdominal pain.  Benign neuro exam.  We will plan to check labs, imaging.  His initial troponin is 38.  He has had troponins in the past, most recently in 2018 but did not appear to be elevated at that time.  CMP shows BUN of 20, creatinine of 1.94.  His baseline is between 1.4-1.5.  CBC shows no leukocytosis.  Hemoglobin stable at 12.9.   Chest x-ray shows evidence of prior median sternotomy.  He has an artificial aortic valve.  No other acute abnormalities.  She is still hypotensive despite a liter of fluids.  At this time, have no source.  We will plan for CT without contrast to evaluate his AAA.  Patient signed out to Charlann Lange, PA-C pending CT scan and delta trop.   Portions of this note were generated with [employer]. Dictation errors may occur despite best attempts at proofreading.  Final Clinical Impression(s) / ED Diagnoses Final diagnoses:  Hypotension, unspecified hypotension type  AKI (acute kidney injury) ([place])  NSTEMI (non-ST elevated myocardial infarction) [facility])    Rx / DC Orders ED Discharge Orders    None       Desma Mcgregor 01/10/20 1537    Malvin Johns, MD 01/10/20 1540

## 2020-01-09 NOTE — ED Provider Notes (Signed)
7 pm at dinner, after which he became generally weak, SOB, sweaty One episode of diarrhea BP in the 80's for EMS, diaphoretic No CP, AP No recent illness Getting rehydrated now Normal exam Trop is 38 - new per history EKG unremarkable New AKI H/O AAA  Pending CT w/o CM to eval size Anticipate admission  CT shows 3.36 cm infrarenal AAA; bladder stone of 1.9 cm; cholelithiasis w/o -cystitis; hiatal hernia.  12:20 - Delta trop 167, up from initial of 38. Intro and re-eval of the patient: he feels much better than when symptoms started. BP 111 now. He denies any pain at any time. EKG without evidence ischemia. Discussed plan for admission and patient is agreeable.   History reviewed. Significant CAD with previous bypass, last cath in 2018 [facility]) showing patent bypass grafts, with severe native vessel disease.  Consult to cardiology paged out for care recommendation. Plan to admit to hospitalist.  Discussed with cardiology who will see the patient in the ED tonight, will treat as NSTEMI. Aspirin and Heparin per pharmacy ordered. Hospitalist paged for admission.   Discussed with Dr. Toniann Fail who accepts the patient for admission.     Elpidio Anis, PA-C 01/10/20 [id number]    Rolan Bucco, MD 01/10/20 [phone]

## 2020-01-09 NOTE — ED Triage Notes (Signed)
Pt arrived via [facility] from home for c/o weakness. SOB, dizziness, diaphretic, "heart pounding" that lasted for . When EMS arrived pt bp 72/46 after NS given bp came up to 1q06/74. Pt A&Ox4. NSR on monitor. Pt hypotensive. Skin color WNL, breathing normal.

## 2020-01-09 NOTE — ED Notes (Signed)
Please call daughter for any question also for updates [phone]

## 2020-01-10 ENCOUNTER — Encounter (HOSPITAL_COMMUNITY): Payer: Self-pay | Admitting: Internal Medicine

## 2020-01-10 DIAGNOSIS — N210 Calculus in bladder: Secondary | ICD-10-CM | POA: Diagnosis present

## 2020-01-10 DIAGNOSIS — Z951 Presence of aortocoronary bypass graft: Secondary | ICD-10-CM | POA: Diagnosis not present

## 2020-01-10 DIAGNOSIS — I714 Abdominal aortic aneurysm, without rupture: Secondary | ICD-10-CM | POA: Diagnosis present

## 2020-01-10 DIAGNOSIS — R55 Syncope and collapse: Secondary | ICD-10-CM | POA: Diagnosis present

## 2020-01-10 DIAGNOSIS — E785 Hyperlipidemia, unspecified: Secondary | ICD-10-CM | POA: Diagnosis present

## 2020-01-10 DIAGNOSIS — I214 Non-ST elevation (NSTEMI) myocardial infarction: Secondary | ICD-10-CM | POA: Diagnosis present

## 2020-01-10 DIAGNOSIS — I129 Hypertensive chronic kidney disease with stage 1 through stage 4 chronic kidney disease, or unspecified chronic kidney disease: Secondary | ICD-10-CM | POA: Diagnosis present

## 2020-01-10 DIAGNOSIS — I351 Nonrheumatic aortic (valve) insufficiency: Secondary | ICD-10-CM | POA: Diagnosis not present

## 2020-01-10 DIAGNOSIS — I2510 Atherosclerotic heart disease of native coronary artery without angina pectoris: Secondary | ICD-10-CM | POA: Diagnosis present

## 2020-01-10 DIAGNOSIS — I471 Supraventricular tachycardia: Secondary | ICD-10-CM | POA: Diagnosis present

## 2020-01-10 DIAGNOSIS — J309 Allergic rhinitis, unspecified: Secondary | ICD-10-CM | POA: Diagnosis present

## 2020-01-10 DIAGNOSIS — I25810 Atherosclerosis of coronary artery bypass graft(s) without angina pectoris: Secondary | ICD-10-CM | POA: Diagnosis not present

## 2020-01-10 DIAGNOSIS — I959 Hypotension, unspecified: Secondary | ICD-10-CM | POA: Diagnosis present

## 2020-01-10 DIAGNOSIS — R197 Diarrhea, unspecified: Secondary | ICD-10-CM | POA: Diagnosis present

## 2020-01-10 DIAGNOSIS — F0390 Unspecified dementia without behavioral disturbance: Secondary | ICD-10-CM | POA: Diagnosis present

## 2020-01-10 DIAGNOSIS — N529 Male erectile dysfunction, unspecified: Secondary | ICD-10-CM | POA: Diagnosis present

## 2020-01-10 DIAGNOSIS — E7800 Pure hypercholesterolemia, unspecified: Secondary | ICD-10-CM | POA: Diagnosis present

## 2020-01-10 DIAGNOSIS — R002 Palpitations: Secondary | ICD-10-CM | POA: Diagnosis present

## 2020-01-10 DIAGNOSIS — Z20822 Contact with and (suspected) exposure to covid-19: Secondary | ICD-10-CM | POA: Diagnosis present

## 2020-01-10 DIAGNOSIS — E1165 Type 2 diabetes mellitus with hyperglycemia: Secondary | ICD-10-CM | POA: Diagnosis present

## 2020-01-10 DIAGNOSIS — N179 Acute kidney failure, unspecified: Secondary | ICD-10-CM

## 2020-01-10 DIAGNOSIS — I340 Nonrheumatic mitral (valve) insufficiency: Secondary | ICD-10-CM | POA: Diagnosis not present

## 2020-01-10 DIAGNOSIS — N183 Chronic kidney disease, stage 3 unspecified: Secondary | ICD-10-CM | POA: Diagnosis present

## 2020-01-10 DIAGNOSIS — R739 Hyperglycemia, unspecified: Secondary | ICD-10-CM | POA: Diagnosis present

## 2020-01-10 DIAGNOSIS — I493 Ventricular premature depolarization: Secondary | ICD-10-CM | POA: Diagnosis present

## 2020-01-10 DIAGNOSIS — I10 Essential (primary) hypertension: Secondary | ICD-10-CM | POA: Diagnosis not present

## 2020-01-10 DIAGNOSIS — E1151 Type 2 diabetes mellitus with diabetic peripheral angiopathy without gangrene: Secondary | ICD-10-CM | POA: Diagnosis present

## 2020-01-10 DIAGNOSIS — I350 Nonrheumatic aortic (valve) stenosis: Secondary | ICD-10-CM | POA: Diagnosis present

## 2020-01-10 DIAGNOSIS — E1122 Type 2 diabetes mellitus with diabetic chronic kidney disease: Secondary | ICD-10-CM | POA: Diagnosis present

## 2020-01-10 LAB — URINALYSIS, ROUTINE W REFLEX MICROSCOPIC
Bilirubin Urine: NEGATIVE
Glucose, UA: NEGATIVE mg/dL
Ketones, ur: NEGATIVE mg/dL
Leukocytes,Ua: NEGATIVE
Nitrite: NEGATIVE
Protein, ur: 30 mg/dL — AB
Specific Gravity, Urine: 1.014 (ref 1.005–1.030)
pH: 5 (ref 5.0–8.0)

## 2020-01-10 LAB — CBG MONITORING, ED: Glucose-Capillary: 84 mg/dL (ref 70–99)

## 2020-01-10 LAB — CBC WITH DIFFERENTIAL/PLATELET
Abs Immature Granulocytes: 0.03 10*3/uL (ref 0.00–0.07)
Basophils Absolute: 0 10*3/uL (ref 0.0–0.1)
Basophils Relative: 1 %
Eosinophils Absolute: 0.2 10*3/uL (ref 0.0–0.5)
Eosinophils Relative: 3 %
HCT: 34 % — ABNORMAL LOW (ref 39.0–52.0)
Hemoglobin: 11.4 g/dL — ABNORMAL LOW (ref 13.0–17.0)
Immature Granulocytes: 0 %
Lymphocytes Relative: 22 %
Lymphs Abs: 1.9 10*3/uL (ref 0.7–4.0)
MCH: 32 pg (ref 26.0–34.0)
MCHC: 33.5 g/dL (ref 30.0–36.0)
MCV: 95.5 fL (ref 80.0–100.0)
Monocytes Absolute: 0.8 10*3/uL (ref 0.1–1.0)
Monocytes Relative: 9 %
Neutro Abs: 5.5 10*3/uL (ref 1.7–7.7)
Neutrophils Relative %: 65 %
Platelets: 137 10*3/uL — ABNORMAL LOW (ref 150–400)
RBC: 3.56 MIL/uL — ABNORMAL LOW (ref 4.22–5.81)
RDW: 13.1 % (ref 11.5–15.5)
WBC: 8.4 10*3/uL (ref 4.0–10.5)
nRBC: 0 % (ref 0.0–0.2)

## 2020-01-10 LAB — COMPREHENSIVE METABOLIC PANEL
ALT: 19 U/L (ref 0–44)
AST: 24 U/L (ref 15–41)
Albumin: 3.1 g/dL — ABNORMAL LOW (ref 3.5–5.0)
Alkaline Phosphatase: 47 U/L (ref 38–126)
Anion gap: 9 (ref 5–15)
BUN: 23 mg/dL (ref 8–23)
CO2: 23 mmol/L (ref 22–32)
Calcium: 8.2 mg/dL — ABNORMAL LOW (ref 8.9–10.3)
Chloride: 107 mmol/L (ref 98–111)
Creatinine, Ser: 1.84 mg/dL — ABNORMAL HIGH (ref 0.61–1.24)
GFR calc Af Amer: 38 mL/min — ABNORMAL LOW (ref >60)
GFR calc non Af Amer: 33 mL/min — ABNORMAL LOW (ref >60)
Glucose, Bld: 97 mg/dL (ref 70–99)
Potassium: 3.9 mmol/L (ref 3.5–5.1)
Sodium: 139 mmol/L (ref 135–145)
Total Bilirubin: 0.3 mg/dL (ref 0.3–1.2)
Total Protein: 5.2 g/dL — ABNORMAL LOW (ref 6.5–8.1)

## 2020-01-10 LAB — HEPARIN LEVEL (UNFRACTIONATED)
Heparin Unfractionated: 0.45 IU/mL (ref 0.30–0.70)
Heparin Unfractionated: 0.47 IU/mL (ref 0.30–0.70)

## 2020-01-10 LAB — GLUCOSE, CAPILLARY
Glucose-Capillary: 82 mg/dL (ref 70–99)
Glucose-Capillary: 89 mg/dL (ref 70–99)

## 2020-01-10 LAB — TROPONIN I (HIGH SENSITIVITY)
Troponin I (High Sensitivity): 790 ng/L (ref <18)
Troponin I (High Sensitivity): 777 ng/L (ref <18)

## 2020-01-10 LAB — SARS CORONAVIRUS 2 (TAT 6-24 HRS): SARS Coronavirus 2: NEGATIVE

## 2020-01-10 LAB — LACTIC ACID, PLASMA: Lactic Acid, Venous: 0.9 mmol/L (ref 0.5–1.9)

## 2020-01-10 LAB — TSH: TSH: 1.178 u[IU]/mL (ref 0.350–4.500)

## 2020-01-10 MED ORDER — CLOPIDOGREL BISULFATE 75 MG PO TABS
75.0000 mg | ORAL_TABLET | Freq: Every day | ORAL | Status: DC
  Administered 2020-01-10 – 2020-01-11 (×2): 75 mg via ORAL
  Filled 2020-01-10 (×2): qty 1

## 2020-01-10 MED ORDER — PROPRANOLOL HCL ER 60 MG PO CP24
120.0000 mg | ORAL_CAPSULE | Freq: Every day | ORAL | Status: DC
  Administered 2020-01-11: 120 mg via ORAL
  Filled 2020-01-10 (×2): qty 1
  Filled 2020-01-10: qty 2
  Filled 2020-01-10 (×2): qty 1

## 2020-01-10 MED ORDER — HEPARIN (PORCINE) 25000 UT/250ML-% IV SOLN
900.0000 [IU]/h | INTRAVENOUS | Status: DC
  Administered 2020-01-10 – 2020-01-11 (×2): 900 [IU]/h via INTRAVENOUS
  Filled 2020-01-10 (×2): qty 250

## 2020-01-10 MED ORDER — ASPIRIN EC 81 MG PO TBEC: 81.0000 mg | DELAYED_RELEASE_TABLET | Freq: Every day | ORAL | Status: DC

## 2020-01-10 MED ORDER — SODIUM CHLORIDE 0.9 % IV SOLN: INTRAVENOUS | Status: AC

## 2020-01-10 MED ORDER — HEPARIN BOLUS VIA INFUSION
3000.0000 [IU] | Freq: Once | INTRAVENOUS | Status: AC
  Administered 2020-01-10: 3000 [IU] via INTRAVENOUS
  Filled 2020-01-10: qty 3000

## 2020-01-10 MED ORDER — ACETAMINOPHEN 325 MG PO TABS: 650.0000 mg | ORAL_TABLET | Freq: Four times a day (QID) | ORAL | Status: DC | PRN

## 2020-01-10 MED ORDER — MEMANTINE HCL 10 MG PO TABS
10.0000 mg | ORAL_TABLET | Freq: Two times a day (BID) | ORAL | Status: DC
  Administered 2020-01-10 – 2020-01-11 (×2): 10 mg via ORAL
  Filled 2020-01-10 (×5): qty 1

## 2020-01-10 MED ORDER — ATORVASTATIN CALCIUM 80 MG PO TABS
80.0000 mg | ORAL_TABLET | Freq: Every day | ORAL | Status: DC
  Administered 2020-01-10 – 2020-01-11 (×2): 80 mg via ORAL
  Filled 2020-01-10 (×2): qty 1

## 2020-01-10 MED ORDER — ASPIRIN 81 MG PO CHEW
324.0000 mg | CHEWABLE_TABLET | Freq: Once | ORAL | Status: AC
  Administered 2020-01-10: 324 mg via ORAL
  Filled 2020-01-10: qty 4

## 2020-01-10 MED ORDER — ASPIRIN EC 81 MG PO TBEC
81.0000 mg | DELAYED_RELEASE_TABLET | Freq: Every day | ORAL | Status: DC
  Administered 2020-01-10 – 2020-01-11 (×2): 81 mg via ORAL
  Filled 2020-01-10 (×2): qty 1

## 2020-01-10 MED ORDER — ACETAMINOPHEN 650 MG RE SUPP: 650.0000 mg | Freq: Four times a day (QID) | RECTAL | Status: DC | PRN

## 2020-01-10 MED ORDER — ENOXAPARIN SODIUM 40 MG/0.4ML ~~LOC~~ SOLN: 40.0000 mg | SUBCUTANEOUS | Status: DC

## 2020-01-10 MED ORDER — PANTOPRAZOLE SODIUM 40 MG PO TBEC
40.0000 mg | DELAYED_RELEASE_TABLET | Freq: Every day | ORAL | Status: DC
  Administered 2020-01-10 – 2020-01-11 (×2): 40 mg via ORAL
  Filled 2020-01-10 (×2): qty 1

## 2020-01-10 MED ORDER — INSULIN ASPART 100 UNIT/ML ~~LOC~~ SOLN: 0.0000 [IU] | Freq: Three times a day (TID) | SUBCUTANEOUS | Status: DC

## 2020-01-10 MED ORDER — OMEGA-3-ACID ETHYL ESTERS 1 G PO CAPS
1.0000 g | ORAL_CAPSULE | Freq: Two times a day (BID) | ORAL | Status: DC
  Administered 2020-01-10 – 2020-01-11 (×2): 1 g via ORAL
  Filled 2020-01-10 (×5): qty 1

## 2020-01-10 MED ORDER — TEMAZEPAM 15 MG PO CAPS: 15.0000 mg | ORAL_CAPSULE | Freq: Every evening | ORAL | Status: DC | PRN

## 2020-01-10 MED ORDER — DONEPEZIL HCL 10 MG PO TABS
10.0000 mg | ORAL_TABLET | Freq: Every day | ORAL | Status: DC
  Filled 2020-01-10 (×2): qty 1

## 2020-01-10 MED ORDER — NIACIN ER (ANTIHYPERLIPIDEMIC) 500 MG PO TBCR
1000.0000 mg | EXTENDED_RELEASE_TABLET | Freq: Every day | ORAL | Status: DC
  Filled 2020-01-10 (×2): qty 2

## 2020-01-10 MED ORDER — GLUCAGON HCL RDNA (DIAGNOSTIC) 1 MG IJ SOLR: 1.0000 mg | Freq: Once | INTRAMUSCULAR | Status: DC

## 2020-01-10 NOTE — ED Notes (Signed)
ED TO INPATIENT HANDOFF REPORT  ED Nurse Name and Phone #: Carlis Abbott [phone]  S Name/Age/Gender Cory Bradley 86 y.o. male Room/Bed: 007C/007C  Code Status   Code Status: Full Code  Home/SNF/Other Home Patient oriented to: self, place, time and situation Is this baseline? Yes   Triage Complete: Triage complete  Chief Complaint Non-STEMI (non-ST elevated myocardial infarction) [facility]) [I21.4]  Triage Note Pt arrived via [facility] from home for c/o weakness. SOB, dizziness, diaphretic, "heart pounding" that lasted for 77mns. When EMS arrived pt bp 72/46 after NS 3025mgiven bp came up to 1q06/74. Pt A&Ox4. NSR on monitor. Pt hypotensive. Skin color WNL, breathing normal.    Allergies Allergies  Allergen Reactions  . Penicillins Swelling    SWELLING, HANDS and FEET  Has patient had a PCN reaction causing immediate rash, facial/tongue/throat swelling, SOB or lightheadedness with hypotension: No Has patient had a PCN reaction causing severe rash involving mucus membranes or skin necrosis: No Has patient had a PCN reaction that required hospitalization: No Has patient had a PCN reaction occurring within the last 10 years: No  . Sulfa Antibiotics Itching    Level of Care/Admitting Diagnosis ED Disposition    ED Disposition Condition [id number]]  Level of Care: Telemetry Cardiac [103]  Covid Evaluation: Confirmed COVID Negative  Date Laboratory Confirmed COVID Negative: 01/09/2020  Diagnosis: [facility] [[id number]]Admitting Physician: KARise [phone] Physician: KARise [phone] length of stay: past midnight tomorrow  Certification:: I certify this patient will need inpatient services for at least 2 midnights       B Medical/Surgery History Past Medical History:  Diagnosis Date  . AAA (abdominal aortic aneurysm) (HCC)    3.3 cm  infrarenal AAA 06/2017 CTA; 3 year f/u rec.  . Allergic rhinitis   . Aortic stenosis    a. Mild-mod by echo 01/2014. // b. Echo 6/18: Mod LVH EF 55-60, normal wall motion, Gr 2 DD, mod to severe AS (mean 40, peak 68; AVA by mean velocity 0.73 cm), mild AI, mod MAC, mild MR, severe LAE, mildly reduced RVSF, mod RAE  . CKD (chronic kidney disease), stage II   . Coronary artery disease    a. acute inferior MI s/p PTCA of RCA followed by CABGx4 in 2005,  . Diabetes mellitus (HCEsko  . ED (erectile dysfunction)   . Hyperlipidemia   . Hypertension   . Irregular heartbeat 10/2016  . Left carotid stenosis   . Memory loss   . Osteoarthritis   . Reflux   . Tobacco abuse    Past Surgical History:  Procedure Laterality Date  . APPENDECTOMY    . CORONARY ARTERY BYPASS GRAFT    . ELBOW SURGERY    . RIGHT/LEFT HEART CATH AND CORONARY/GRAFT ANGIOGRAPHY N/A 05/28/2017   Procedure: Right/Left Heart Cath and Coronary/Graft Angiography;  Surgeon: SmBelva CromeMD;  [facility];  Service: Cardiovascular;  Laterality: N/A;  . TEE WITHOUT CARDIOVERSION N/A 07/31/2017   Procedure: TRANSESOPHAGEAL ECHOCARDIOGRAM (TEE);  Surgeon: McBurnell BlanksMD;  [place] [facility] Service: Open Heart Surgery;  Laterality: N/A;  . TRANSCATHETER AORTIC VALVE REPLACEMENT, TRANSFEMORAL N/A 07/31/2017   Procedure: TRANSCATHETER AORTIC VALVE REPLACEMENT, TRANSFEMORAL;  Surgeon: McBurnell BlanksMD;  [place] [facility] Service: Open [facility] Surgery;  Laterality: N/A;     A IV Location/Drains/Wounds Patient Lines/Drains/Airways  Status   Active Line/Drains/Airways    Name:   Placement date:   Placement time:   Site:   Days:   Peripheral IV 01/09/20 Left Hand   01/09/20    --    Hand   1   Peripheral IV 01/09/20 Right Antecubital   01/09/20    2141    Antecubital   1   Incision (Closed) 07/31/17 Groin Right   07/31/17    1024     893   Incision (Closed) 07/31/17 Groin Left   07/31/17    1024      893          Intake/Output Last 24 hours  Intake/Output Summary (Last 24 hours) at 01/10/2020 1400 Last data filed at 01/10/2020 0906 Gross per 24 hour  Intake 101.76 ml  Output 200 ml  Net -98.24 ml    Labs/Imaging Results for orders placed or performed during the hospital encounter of 01/09/20 (from the past 48 hour(s))  Comprehensive metabolic panel     Status: Abnormal   Collection Time: 01/09/20  9:27 PM  Result Value Ref Range   Sodium 137 135 - 145 mmol/L   Potassium 3.5 3.5 - 5.1 mmol/L   Chloride 105 98 - 111 mmol/L   CO2 24 22 - 32 mmol/L   Glucose, Bld 203 (H) 70 - 99 mg/dL   BUN 20 8 - 23 mg/dL   Creatinine, Ser 1.94 (H) 0.61 - 1.24 mg/dL   Calcium 8.6 (L) 8.9 - 10.3 mg/dL   Total Protein 6.2 (L) 6.5 - 8.1 g/dL   Albumin 3.7 3.5 - 5.0 g/dL   AST 23 15 - 41 U/L   ALT 21 0 - 44 U/L   Alkaline Phosphatase 50 38 - 126 U/L   Total Bilirubin 0.6 0.3 - 1.2 mg/dL   GFR calc non Af Amer 31 (L) >60 mL/min   GFR calc Af Amer 36 (L) >60 mL/min   Anion gap 8 5 - 15    Comment: Performed at [facility], [address] [place], [place] [address]  CBC with Differential     Status: Abnormal   Collection Time: 01/09/20  9:27 PM  Result Value Ref Range   WBC 7.6 4.0 - 10.5 K/uL   RBC 4.06 (L) 4.22 - 5.81 MIL/uL   Hemoglobin 12.9 (L) 13.0 - 17.0 g/dL   HCT 39.2 39.0 - 52.0 %   MCV 96.6 80.0 - 100.0 fL   MCH 31.8 26.0 - 34.0 pg   MCHC 32.9 30.0 - 36.0 g/dL   RDW 13.0 11.5 - 15.5 %   Platelets 155 150 - 400 K/uL   nRBC 0.0 0.0 - 0.2 %   Neutrophils Relative % 72 %   Neutro Abs 5.5 1.7 - 7.7 K/uL   Lymphocytes Relative 18 %   Lymphs Abs 1.4 0.7 - 4.0 K/uL   Monocytes Relative 6 %   Monocytes Absolute 0.4 0.1 - 1.0 K/uL   Eosinophils Relative 3 %   Eosinophils Absolute 0.2 0.0 - 0.5 K/uL   Basophils Relative 1 %   Basophils Absolute 0.1 0.0 - 0.1 K/uL   Immature Granulocytes 0 %   Abs Immature Granulocytes 0.02 0.00 - 0.07 K/uL    Comment: Performed at [facility] [address], [place], [place] [address]  Troponin I (High Sensitivity)     Status: Abnormal   Collection Time: 01/09/20  9:27 PM  Result Value Ref Range  Troponin I (High Sensitivity) 38 (H) <18 ng/L    Comment: (NOTE) Elevated high sensitivity troponin I (hsTnI) values and significant  changes across serial measurements may suggest ACS but many other  chronic and acute conditions are known to elevate hsTnI results.  Refer to the "Links" section for chest pain algorithms and additional  guidance. Performed at [facility], [place] [address], [place], [place] [address]   POC CBG, ED     Status: Abnormal   Collection Time: 01/09/20  9:51 PM  Result Value Ref Range   Glucose-Capillary 155 (H) 70 - 99 mg/dL  Lactic acid, plasma     Status: None   Collection Time: 01/09/20 10:34 PM  Result Value Ref Range   Lactic Acid, Venous 1.6 0.5 - 1.9 mmol/L    Comment: Performed at [facility] [address] [place], [place] [address]  Troponin I (High Sensitivity)     Status: Abnormal   Collection Time: 01/09/20 11:09 PM  Result Value Ref Range   Troponin I (High Sensitivity) 164 (HH) <18 ng/L    Comment: CRITICAL RESULT CALLED TO, READ BACK BY AND VERIFIED WITH: RN A CAIN _0  01/09/20 BY S GEZAHEGN (NOTE) Elevated high sensitivity troponin I (hsTnI) values and significant  changes across serial measurements may suggest ACS but many other  chronic and acute conditions are known to elevate hsTnI results.  Refer to the Links section for chest pain algorithms and additional  guidance. Performed at [facility], [place] [address], [place], [place] [address]   SARS CORONAVIRUS 2 (TAT 6-24 HRS) Nasopharyngeal Nasopharyngeal Swab     Status: None   Collection Time: 01/09/20 11:38 PM   Specimen: Nasopharyngeal Swab  Result Value Ref Range   SARS Coronavirus 2 NEGATIVE NEGATIVE    Comment: (NOTE) SARS-CoV-2 target nucleic acids are NOT DETECTED. The SARS-CoV-2  RNA is generally detectable in upper and lower respiratory specimens during the acute phase of infection. Negative results do not preclude SARS-CoV-2 infection, do not rule out co-infections with other pathogens, and should not be used as the sole basis for treatment or other patient management decisions. Negative results must be combined with clinical observations, patient history, and epidemiological information. The expected result is Negative. Fact Sheet for Patients: SugarRoll.be Fact Sheet for Healthcare Providers: https://www.woods-mathews.com/ This test is not yet approved or cleared by the [place] FDA and  has been authorized for detection and/or diagnosis of SARS-CoV-2 by FDA under an Emergency Use Authorization (EUA). This EUA will remain  in effect (meaning this test can be used) for the duration of the COVID-19 declaration under Section 56 4(b)(1) of the Act, 21 U.S.C. section 360bbb-3(b)(1), unless the authorization is terminated or revoked sooner. Performed at [facility], [place] [address] [place],  [address]   Urinalysis, Routine w reflex microscopic     Status: Abnormal   Collection Time: 01/10/20 12:53 AM  Result Value Ref Range   Color, Urine YELLOW YELLOW   APPearance HAZY (A) CLEAR   Specific Gravity, Urine 1.014 1.005 - 1.030   pH 5.0 5.0 - 8.0   Glucose, UA NEGATIVE NEGATIVE mg/dL   Hgb urine dipstick SMALL (A) NEGATIVE   Bilirubin Urine NEGATIVE NEGATIVE   Ketones, ur NEGATIVE NEGATIVE mg/dL   Protein, ur 30 (A) NEGATIVE mg/dL   Nitrite NEGATIVE NEGATIVE   Leukocytes,Ua NEGATIVE NEGATIVE   RBC / HPF 0-5 0 - 5 RBC/hpf   WBC, UA 6-10 0 - 5  WBC/hpf   Bacteria, UA RARE (A) NONE SEEN   Squamous Epithelial / LPF 0-5 0 - 5   Mucus PRESENT    Hyaline Casts, UA PRESENT    Granular Casts, UA PRESENT     Comment: Performed at [facility], [place] [address], [place], [place] [address]  Comprehensive  metabolic panel     Status: Abnormal   Collection Time: 01/10/20  4:18 AM  Result Value Ref Range   Sodium 139 135 - 145 mmol/L   Potassium 3.9 3.5 - 5.1 mmol/L   Chloride 107 98 - 111 mmol/L   CO2 23 22 - 32 mmol/L   Glucose, Bld 97 70 - 99 mg/dL   BUN 23 8 - 23 mg/dL   Creatinine, Ser 1.84 (H) 0.61 - 1.24 mg/dL   Calcium 8.2 (L) 8.9 - 10.3 mg/dL   Total Protein 5.2 (L) 6.5 - 8.1 g/dL   Albumin 3.1 (L) 3.5 - 5.0 g/dL   AST 24 15 - 41 U/L   ALT 19 0 - 44 U/L   Alkaline Phosphatase 47 38 - 126 U/L   Total Bilirubin 0.3 0.3 - 1.2 mg/dL   GFR calc non Af Amer 33 (L) >60 mL/min   GFR calc Af Amer 38 (L) >60 mL/min   Anion gap 9 5 - 15    Comment: Performed at [facility] [address] [place],  [place] [address]  CBC WITH DIFFERENTIAL     Status: Abnormal   Collection Time: 01/10/20  4:18 AM  Result Value Ref Range   WBC 8.4 4.0 - 10.5 K/uL   RBC 3.56 (L) 4.22 - 5.81 MIL/uL   Hemoglobin 11.4 (L) 13.0 - 17.0 g/dL   HCT 34.0 (L) 39.0 - 52.0 %   MCV 95.5 80.0 - 100.0 fL   MCH 32.0 26.0 - 34.0 pg   MCHC 33.5 30.0 - 36.0 g/dL   RDW 13.1 11.5 - 15.5 %   Platelets 137 (L) 150 - 400 K/uL   nRBC 0.0 0.0 - 0.2 %   Neutrophils Relative % 65 %   Neutro Abs 5.5 1.7 - 7.7 K/uL   Lymphocytes Relative 22 %   Lymphs Abs 1.9 0.7 - 4.0 K/uL   Monocytes Relative 9 %   Monocytes Absolute 0.8 0.1 - 1.0 K/uL   Eosinophils Relative 3 %   Eosinophils Absolute 0.2 0.0 - 0.5 K/uL   Basophils Relative 1 %   Basophils Absolute 0.0 0.0 - 0.1 K/uL   Immature Granulocytes 0 %   Abs Immature Granulocytes 0.03 0.00 - 0.07 K/uL    Comment: Performed at [facility], [address] [place], [place] [address]  TSH     Status: None   Collection Time: 01/10/20  4:18 AM  Result Value Ref Range   TSH 1.178 0.350 - 4.500 uIU/mL    Comment: Performed by a 3rd Generation assay with a functional sensitivity of <=0.01 uIU/mL. Performed at [facility], [place] [address], [place],   [address]   Troponin I (High Sensitivity)     Status: Abnormal   Collection Time: 01/10/20  4:18 AM  Result Value Ref Range   Troponin I (High Sensitivity) 790 (HH) <18 ng/L    Comment: CRITICAL VALUE NOTED.  VALUE IS CONSISTENT WITH PREVIOUSLY REPORTED AND CALLED VALUE. (NOTE) Elevated high sensitivity troponin I (hsTnI) values and significant  changes across serial measurements may suggest ACS but many other  chronic and acute conditions  are known to elevate hsTnI results.  Refer to the Links section for chest pain algorithms and additional  guidance. Performed at [facility], [place] [address] [place], [place] [address]   Lactic acid, plasma     Status: None   Collection Time: 01/10/20  6:28 AM  Result Value Ref Range   Lactic Acid, Venous 0.9 0.5 - 1.9 mmol/L    Comment: Performed at [facility] [address], [place], [place] [address]  Troponin I (High Sensitivity)     Status: Abnormal   Collection Time: 01/10/20  6:28 AM  Result Value Ref Range   Troponin I (High Sensitivity) 777 (HH) <18 ng/L    Comment: CRITICAL VALUE NOTED.  VALUE IS CONSISTENT WITH PREVIOUSLY REPORTED AND CALLED VALUE. (NOTE) Elevated high sensitivity troponin I (hsTnI) values and significant  changes across serial measurements may suggest ACS but many other  chronic and acute conditions are known to elevate hsTnI results.  Refer to the Links section for chest pain algorithms and additional  guidance. Performed at [facility], [place] [address] [place], [place] [address]   CBG monitoring, ED     Status: None   Collection Time: 01/10/20  8:01 AM  Result Value Ref Range   Glucose-Capillary 84 70 - 99 mg/dL   Comment 1 Notify RN    Comment 2 Document in Chart   Heparin level (unfractionated)     Status: None   Collection Time: 01/10/20  8:35 AM  Result Value Ref Range   Heparin Unfractionated 0.45 0.30 - 0.70 IU/mL    Comment: (NOTE) If heparin results are below expected values,  and patient dosage has  been confirmed, suggest follow up testing of antithrombin III levels. Performed at [facility], [place] [address] [place], [place] [address]    CT ABDOMEN PELVIS WO CONTRAST  Result Date: 01/10/2020 CLINICAL DATA:  AAA. Diarrhea and nausea for 2 hours. EXAM: CT ABDOMEN AND PELVIS WITHOUT CONTRAST TECHNIQUE: Multidetector CT imaging of the abdomen and pelvis was performed following the standard protocol without IV contrast. COMPARISON:  CT dated 04/11/2017. FINDINGS: Lower chest: The lung bases are clear. The heart size is normal. Hepatobiliary: The liver is normal. Cholelithiasis without acute inflammation.There is no biliary ductal dilation. Pancreas: Normal contours without ductal dilatation. No peripancreatic fluid collection. Spleen: No splenic laceration or hematoma. Adrenals/Urinary Tract: --Adrenal glands: No adrenal hemorrhage. --Right kidney/ureter: There is a nonobstructing stone in the lower pole the right kidney measuring approximately 8 mm. There is no right-sided hydronephrosis. There is severe cortical thinning of the lower pole the right kidney. --Left kidney/ureter: No hydronephrosis or perinephric hematoma. --Urinary bladder: There is a large bladder stone in the dependent portion of the urinary bladder measuring approximately 1.9 cm. Stomach/Bowel: --Stomach/Duodenum: There is a large hiatal hernia. --Small bowel: No dilatation or inflammation. --Colon: There is near pancolonic diverticulosis without CT evidence for diverticulitis. --Appendix: Normal. Vascular/Lymphatic: There are advanced atherosclerotic changes of the abdominal aorta. There is an infrarenal abdominal aortic aneurysm measuring approximately 3.6 cm on the sagittal series (series 7, image 103). --No retroperitoneal lymphadenopathy. --No mesenteric lymphadenopathy. --No pelvic or inguinal lymphadenopathy. Reproductive: The prostate gland is enlarged. Other: There is a fat containing right  inguinal hernia. The abdominal wall is normal. Musculoskeletal. There is a chronic appearing compression fracture of the T10 vertebral body. Degenerative changes are noted throughout the visualized thoracolumbar spine. IMPRESSION: 1. There is an infrarenal abdominal aortic aneurysm measuring approximately 3.6  cm. Recommend followup by ultrasound in 2 years. This recommendation follows ACR consensus guidelines: White Paper of the ACR Incidental Findings Committee II on Vascular Findings. J Am Coll Radiol 2013; 10:789-794. Aortic aneurysm NOS (ICD10-I71.9) 2. There is a large bladder stone measuring approximately 1.9 cm. 3. There is near pancolonic diverticulosis without CT evidence for diverticulitis. 4. There is a nonobstructing 8 mm stone in the lower pole the right kidney. 5. There is cholelithiasis without secondary signs of acute cholecystitis. 6. Prostatomegaly. 7. Large hiatal hernia. Aortic Atherosclerosis (ICD10-I70.0). Electronically Signed   By: Constance Holster M.D.   On: 01/10/2020 00:02   DG Chest Portable 1 View  Result Date: 01/09/2020 CLINICAL DATA:  Syncope. EXAM: PORTABLE CHEST 1 VIEW COMPARISON:  July 31, 2017 FINDINGS: Multiple sternal wires and vascular clips are seen. There is no evidence of acute infiltrate, pleural effusion or pneumothorax. The heart size and mediastinal contours are within normal limits. An artificial aortic valve is noted. Multilevel degenerative changes seen throughout the thoracic spine. IMPRESSION: 1. Evidence of prior median sternotomy. 2. Artificial aortic valve. 3. No acute or active cardiopulmonary disease. Electronically Signed   By: Virgina Norfolk M.D.   On: 01/09/2020 22:16    Pending Labs Unresulted Labs (From admission, onward)    Start     Ordered   01/17/20 0500  Creatinine, serum  (enoxaparin (LOVENOX)    CrCl >/= 30 ml/min)  Weekly,   R    Comments: while on enoxaparin therapy    01/10/20 0349   01/11/20 0500  Heparin level  (unfractionated)  Daily,   R     01/10/20 0046   01/10/20 1600  Heparin level (unfractionated)  Once-Timed,   STAT     01/10/20 1237   01/10/20 0500  CBC  Daily,   R     01/10/20 0046          Vitals/Pain Today's Vitals   01/10/20 0904 01/10/20 1100 01/10/20 1200 01/10/20 1355  BP:  127/72 114/84   Pulse:  (!) 55 (!) 58   Resp:   13   Temp:      TempSrc:      SpO2:  93% 94%   Weight:      Height:      PainSc: 0-No pain   0-No pain    Isolation Precautions No active isolations  Medications Medications  heparin ADULT infusion 100 units/mL (25000 units/239m sodium chloride 0.45%) (900 Units/hr Intravenous Rate/Dose Verify 01/10/20 0906)  aspirin EC tablet 81 mg (81 mg Oral Given 01/10/20 0902)  atorvastatin (LIPITOR) tablet 80 mg (80 mg Oral Given 01/10/20 0902)  niacin (NIASPAN) CR tablet 1,000 mg (has no administration in time range)  propranolol ER (INDERAL LA) 24 hr capsule 120 mg (has no administration in time range)  donepezil (ARICEPT) tablet 10 mg (has no administration in time range)  memantine (NAMENDA) tablet 10 mg (10 mg Oral Given 01/10/20 0903)  temazepam (RESTORIL) capsule 15 mg (has no administration in time range)  pantoprazole (PROTONIX) EC tablet 40 mg (40 mg Oral Given 01/10/20 0903)  clopidogrel (PLAVIX) tablet 75 mg (75 mg Oral Given 01/10/20 0902)  omega-3 acid ethyl esters (LOVAZA) capsule 1 g (1 g Oral Given 01/10/20 0902)  acetaminophen (TYLENOL) tablet 650 mg (has no administration in time range)    Or  acetaminophen (TYLENOL) suppository 650 mg (has no administration in time range)  insulin aspart (novoLOG) injection 0-9 Units (0 Units Subcutaneous Not Given 01/10/20 1234)  0.9 %  sodium chloride infusion ( Intravenous New Bag/Given 01/10/20 0409)  sodium chloride 0.9 % bolus 1,000 mL (0 mLs Intravenous Stopped 01/09/20 2321)  aspirin chewable tablet 324 mg (324 mg Oral Given 01/10/20 0038)  heparin bolus via infusion 3,000 Units (3,000 Units Intravenous  Bolus from Bag 01/10/20 0107)    Mobility walks Low fall risk   Focused Assessments Cardiac Assessment Handoff:    Lab Results  Component Value Date   CKTOTAL 99 11/07/2007   CKMB 1.5 11/07/2007   TROPONINI 0.02        NO INDICATION OF MYOCARDIAL INJURY. 11/06/2007   No results found for: DDIMER Does the Patient currently have chest pain? No     R Recommendations: See Admitting Provider Note  Report given to:   Additional Notes:

## 2020-01-10 NOTE — Consult Note (Signed)
Cardiology Consultation:   Patient ID: Cory Bradley MRN: [id number]; DOB: [birth year]  Admit date: 01/09/2020 Date of Consult: 01/10/2020  Primary Care Provider: System, Pcp Not In Primary Cardiologist: Sinclair Grooms, MD  Primary Electrophysiologist:  Dr. Curt Bears   Patient Profile:   Cory Bradley is a 86 y.o. male with DMT2, HTN, HLD, CKD, AAA, CAD s/p CABG x4 (2005), PVCs (42% PVC burden previously) on amiodarone and severe AS s/p TAVR 07/2017 who is being seen today for the evaluation of elevated troponin at the request of Dr. Tamera Punt.  History of Present Illness:   Cory Bradley has a complex cardiac history.  He underwent a four-vessel CABG in 2005 and had patent bypass grafts on most recent cath in 2018, history of SVT, history of significant PVC burden, severe aortic stenosis status post TAVR 07/2017, hypertension, hyperlipidemia, CKD, type 2 diabetes who presents due to presyncope.  The patient reports that he was in his usual state of health without any complaints when, this evening he developed sudden onset sensation of severe weakness, lightheadedness and heart pounding.  The patient reports that he sat down as he thought that he was going to pass out.  He also reports that he had profuse diaphoresis and shortness of breath.  During this episode, he felt that he needed to have a bowel movement and subsequently had an episode of diarrhea.  The ongoing sensation of heart pounding lightheadedness and weakness continued and thus the patient called EMS.  He reports that prior to their arrival, he began to feel improved.  When EMS arrived the patient was found to have a blood pressure of 72/46 which improved to 106/74 after a small bolus of normal saline.  He was subsequently brought to the ED for further evaluation.  The patient reports that he did not have chest pain throughout this entire episode.  Notably, the patient had a similar presentation in 2017.  At that time, the patient had  sudden onset rapid heart rate and associated shortness of breath, lightheadedness.  EMS was called and he was found to be in SVT with a heart rate of 150 which resolved with administration of adenosine.  Since that time, he has not had any documented episodes of SVT but has been followed by EP for a significant PVC burden, up to 42%.  He was trialed on amiodarone at one point in time but had intolerance due to GI symptoms and thus this was discontinued.  On his most recent Holter monitor in 2019, the patient had <1% PVCs.  Most recently, the patient reports that he is very active and does not have any symptoms.  He reports walking 30 minutes a day without issue.  Upon arrival to the emergency department the patient was found to be hypotensive with a blood pressure of 85/55 his work-up included EKG which did not reveal acute ischemia laboratory evaluation remarkable for creatinine elevated to 1.9, above his baseline of 1.5.  High-sensitivity troponin was initially 38 but rose to 164.  At the time of my evaluation the patient reports that he feels "great".  He is concerned about the amount of time that he will need to stay in the hospital.  He reports that he would like to go home but is willing to stay for further evaluation.  He denies recent fever, chills, abdominal pain, dysuria, syncope, PND, lower extremity edema.  Heart Pathway Score:  HEAR Score: 5  Past Medical History:  Diagnosis Date  . AAA (  abdominal aortic aneurysm) (HCC)    3.3 cm infrarenal AAA 06/2017 CTA; 3 year f/u rec.  . Allergic rhinitis   . Aortic stenosis    a. Mild-mod by echo 01/2014. // b. Echo 6/18: Mod LVH EF 55-60, normal wall motion, Gr 2 DD, mod to severe AS (mean 40, peak 68; AVA by mean velocity 0.73 cm), mild AI, mod MAC, mild MR, severe LAE, mildly reduced RVSF, mod RAE  . CKD (chronic kidney disease), stage II   . Coronary artery disease    a. acute inferior MI s/p PTCA of RCA followed by CABGx4 in 2005,  . Diabetes  mellitus ([place])   . ED (erectile dysfunction)   . Hyperlipidemia   . Hypertension   . Irregular heartbeat 10/2016  . Left carotid stenosis   . Memory loss   . Osteoarthritis   . Reflux   . Tobacco abuse     Past Surgical History:  Procedure Laterality Date  . APPENDECTOMY    . CORONARY ARTERY BYPASS GRAFT    . ELBOW SURGERY    . RIGHT/LEFT HEART CATH AND CORONARY/GRAFT ANGIOGRAPHY N/A 05/28/2017   Procedure: Right/Left Heart Cath and Coronary/Graft Angiography;  Surgeon: Belva Crome, MD;  Location: [facility];  Service: Cardiovascular;  Laterality: N/A;  . TEE WITHOUT CARDIOVERSION N/A 07/31/2017   Procedure: TRANSESOPHAGEAL ECHOCARDIOGRAM (TEE);  Surgeon: Burnell Blanks, MD;  Location: [facility];  Service: Open Heart Surgery;  Laterality: N/A;  . TRANSCATHETER AORTIC VALVE REPLACEMENT, TRANSFEMORAL N/A 07/31/2017   Procedure: TRANSCATHETER AORTIC VALVE REPLACEMENT, TRANSFEMORAL;  Surgeon: Burnell Blanks, MD;  Location: [place];  Service: Open Heart Surgery;  Laterality: N/A;    Home Medications:  Prior to Admission medications   Medication Sig Start Date End Date Taking? Authorizing Provider  aspirin EC 81 MG tablet Take 1 tablet (81 mg total) by mouth daily. 05/16/17   Richardson Dopp T, PA-C  atorvastatin (LIPITOR) 80 MG tablet Take 80 mg by mouth daily.    [provider]  clindamycin (CLEOCIN) 150 MG capsule TAKE 4 CAPSULES BY MOUTH 30-60 MINUTES BEFORE PROCEDURE 01/08/19   Eileen Stanford, PA-C  clopidogrel (PLAVIX) 75 MG tablet Take 1 tablet (75 mg total) by mouth daily. Please make yearly appt with Dr. Tamala Julian for March before anymore refills. 1st attempt 01/01/20   Belva Crome, MD  Coenzyme Q10 10 MG capsule Take 100 mg by mouth daily.    [provider]  diphenhydrAMINE (BENADRYL) 25 MG tablet Take 25 mg by mouth at bedtime.     [provider]  donepezil (ARICEPT) 10 MG tablet Take 10 mg by mouth at bedtime.     [provider]  hydrochlorothiazide (MICROZIDE) 12.5 MG capsule Take 1 capsule (12.5 mg total) by mouth daily. 03/21/18 03/16/19  Belva Crome, MD  irbesartan (AVAPRO) 150 MG tablet Take 1 tablet (150 mg total) by mouth daily. 06/14/17   Tanda Rockers, MD  memantine (NAMENDA) 10 MG tablet Take 10 mg by mouth 2 (two) times daily.    [provider]  metFORMIN (GLUCOPHAGE) 500 MG tablet TAKE 1 TABLET (500 MG TOTAL) BY MOUTH AT BEDTIME 08/10/15   [provider]  niacin (NIASPAN) 1000 MG CR tablet Take 1 tablet (1,000 mg total) by mouth at bedtime. 08/10/17   Lyda Jester M, PA-C  Omega-3 Fatty Acids (FISH OIL) 1000 MG CAPS Take 1,000 mg by mouth 2 (two) times daily.    [provider]  omeprazole (PRILOSEC) 40 MG capsule Take 40 mg by mouth daily.    [provider]  propranolol ER (INDERAL LA) 120 MG 24 hr capsule Take 120 mg by mouth daily.    [provider]  Specialty Vitamins Products (PROSTATE) TABS Take 1 tablet by mouth daily.    [provider]  temazepam (RESTORIL) 15 MG capsule Take 15 mg by mouth at bedtime.  09/11/16   [provider]    Inpatient Medications: Scheduled Meds:  Continuous Infusions:  PRN Meds:   Allergies:    Allergies  Allergen Reactions  . Penicillins Swelling    SWELLING, HANDS and FEET  Has patient had a PCN reaction causing immediate rash, facial/tongue/throat swelling, SOB or lightheadedness with hypotension: No Has patient had a PCN reaction causing severe rash involving mucus membranes or skin necrosis: No Has patient had a PCN reaction that required hospitalization: No Has patient had a PCN reaction occurring within the last 10 years: No  . Sulfa Antibiotics Itching    Social History:   Social History   Socioeconomic History  . Marital status: Widowed    Spouse name: Not on file  . Number of children: 3  . Years of education: Not on file  . Highest education level: Not on  file  Occupational History  . Occupation: Professor at [employer]  . Smoking status: Former Smoker    Packs/day: 0.20    Years: 40.00    Pack years: 8.00    Types: Cigarettes  . Smokeless tobacco: Never Used  Substance and Sexual Activity  . Alcohol use: No    Alcohol/week: 0.0 standard drinks  . Drug use: No  . Sexual activity: Not on file  Other Topics Concern  . Not on file  Social History Narrative  . Not on file   Social Determinants of Health   Financial Resource Strain:   . Difficulty of Paying Living Expenses: Not on file  Food Insecurity:   . Worried About Charity fundraiser in the Last Year: Not on file  . Ran Out of Food in the Last Year: Not on file  Transportation Needs:   . Lack of Transportation (Medical): Not on file  . Lack of Transportation (Non-Medical): Not on file  Physical Activity:   . Days of Exercise per Week: Not on file  . Minutes of Exercise per Session: Not on file  Stress:   . Feeling of Stress : Not on file  Social Connections:   . Frequency of Communication with Friends and Family: Not on file  . Frequency of Social Gatherings with Friends and Family: Not on file  . Attends Religious Services: Not on file  . Active Member of Clubs or Organizations: Not on file  . Attends Archivist Meetings: Not on file  . Marital Status: Not on file  Intimate Partner Violence:   . Fear of Current or Ex-Partner: Not on file  . Emotionally Abused: Not on file  . Physically Abused: Not on file  . Sexually Abused: Not on file    Family History:   Family History  Problem Relation Age of Onset  . Heart disease Father      ROS:  Please see the history of present illness.  All other ROS reviewed and negative.     Physical Exam/Data:   Vitals:   01/10/20 0015 01/10/20 0030 01/10/20 0045 01/10/20 0100  BP: (!) 102/54 (!) 106/54 (!) 120/93 (!) 135/49  Pulse: [name])  56 (!) 58 87 76  Resp: _0 Temp:      TempSrc:        SpO2: 99% 100% 100% 98%  Weight:      Height:       No intake or output data in the 24 hours ending 01/10/20 0131 Last 3 Weights 01/09/2020 02/26/2019 09/11/2018  Weight (lbs) 175 lb 172 lb 9.6 oz 181 lb 6.4 oz  Weight (kg) 79.379 kg 78.291 kg 82.283 kg     Body mass index is 21.87 kg/m.  General:  Well nourished, well developed, in no acute distress HEENT: normal Lymph: no adenopathy Neck: no JVD Endocrine:  No thryomegaly Vascular: No carotid bruits; FA pulses 2+ bilaterally without bruits  Cardiac:  normal S1, S2; RRR; 2/6 systolic murmur left upper sternal border.  No diastolic murmur. Lungs:  clear to auscultation bilaterally, no wheezing, rhonchi or rales  Abd: soft, nontender, no hepatomegaly  Ext: no edema Musculoskeletal:  No deformities, BUE and BLE strength normal and equal Skin: warm and dry  Neuro:  CNs 2-12 intact, no focal abnormalities noted Psych:  Normal affect   EKG:  The EKG was personally reviewed and demonstrates:  Sinus rhythm with inferior Q waves.  No acute ischemia.  Telemetry:  Telemetry was personally reviewed and demonstrates: Sinus rhythm with frequent PVCs  Relevant CV Studies: Holter 07/04/17  Basic rhythm is NSR  Frequent PVC's, multiform with occasional NSVT.  PVC's account for > 42% of activity during the period of monitoring.  Holter 01/22/18 Minimum HR: 41 BPM at 3:02:54 PM Maximum HR: 128 BPM at 6:03:53 PM Average HR: 58 BPM <1% PVCs, <1% APCs Sinus rhythm with occasional ventricular bigeminy Longest supraventricular run at least 24 beats  TTE 09/11/18 - Left ventricle: The cavity size was normal. There was moderate   concentric hypertrophy. Systolic function was normal. The   estimated ejection fraction was in the range of 60% to 65%. Wall   motion was normal; there were no regional wall motion   abnormalities. There was an increased relative contribution of   atrial contraction to ventricular filling. Doppler parameters are    consistent with abnormal left ventricular relaxation (grade 1   diastolic dysfunction). - Aortic valve: S/P 34m EParticia LatherTAVR which appears to be   functioning normally. There is a mild perivalvular leak by   pressure halftime but visually appears mild to moderate. The mean   AVG was 837mg. A bioprosthesis was present. There was mild   regurgitation. - Aorta: Ascending aortic diameter: 38 mm (S). - Ascending aorta: The ascending aorta was mildly dilated. - Mitral valve: There was mild regurgitation. - Left atrium: The atrium was severely dilated. - Tricuspid valve: There was trivial regurgitation. - Pulmonic valve: There was trivial regurgitation.  Cath 2018  Patent bypass grafts including SVG to PDA, SVG to diagonal, and SVG to obtuse marginal.  Patent LIMA to LAD.  Severe native vessel disease with total occlusion of the mid RCA, occlusion of the ostial to proximal circumflex, occlusion of the first diagonal/ramus intermedius, and diffuse mid LAD disease up to 80%. Second diagonal contains diffuse 80-90% obstruction in the branch. Distal left main is 50% obstructed.  Ventricular ectopy (bigeminy) made hemodynamic assessment difficult. Pulmonary pressures are post PVC recordings and higher than would be expected in normal sinus rhythm due to post PVC contractility augmentation.. Marland Kitchenost PVC systolic pressure gradient is 67 mmHg. Pulmonary wedge pressure mean is 26 mmHg.  Aortic valve  stenosis with valvular gradient also influenced by ventricular bigeminy. Peak to peak gradient of approximately 35 mmHg (using post PVC pressure wave forms), and calculated aortic valve area 1.27 cm  Laboratory Data:  High Sensitivity Troponin:   Recent Labs  Lab 01/09/20 2127 01/09/20 2309  TROPONINIHS 38* 164*     Chemistry Recent Labs  Lab 01/09/20 2127  NA 137  K 3.5  CL 105  CO2 24  GLUCOSE 203*  BUN 20  CREATININE 1.94*  CALCIUM 8.6*  GFRNONAA 31*  GFRAA 36*  ANIONGAP 8      Recent Labs  Lab 01/09/20 2127  PROT 6.2*  ALBUMIN 3.7  AST 23  ALT 21  ALKPHOS 50  BILITOT 0.6   Hematology Recent Labs  Lab 01/09/20 2127  WBC 7.6  RBC 4.06*  HGB 12.9*  HCT 39.2  MCV 96.6  MCH 31.8  MCHC 32.9  RDW 13.0  PLT 155   BNPNo results for input(s): BNP, PROBNP in the last 168 hours.  DDimer No results for input(s): DDIMER in the last 168 hours.   Radiology/Studies:  CT ABDOMEN PELVIS WO CONTRAST  Result Date: 01/10/2020 CLINICAL DATA:  AAA. Diarrhea and nausea for 2 hours. EXAM: CT ABDOMEN AND PELVIS WITHOUT CONTRAST TECHNIQUE: Multidetector CT imaging of the abdomen and pelvis was performed following the standard protocol without IV contrast. COMPARISON:  CT dated 04/11/2017. FINDINGS: Lower chest: The lung bases are clear. The heart size is normal. Hepatobiliary: The liver is normal. Cholelithiasis without acute inflammation.There is no biliary ductal dilation. Pancreas: Normal contours without ductal dilatation. No peripancreatic fluid collection. Spleen: No splenic laceration or hematoma. Adrenals/Urinary Tract: --Adrenal glands: No adrenal hemorrhage. --Right kidney/ureter: There is a nonobstructing stone in the lower pole the right kidney measuring approximately 8 mm. There is no right-sided hydronephrosis. There is severe cortical thinning of the lower pole the right kidney. --Left kidney/ureter: No hydronephrosis or perinephric hematoma. --Urinary bladder: There is a large bladder stone in the dependent portion of the urinary bladder measuring approximately 1.9 cm. Stomach/Bowel: --Stomach/Duodenum: There is a large hiatal hernia. --Small bowel: No dilatation or inflammation. --Colon: There is near pancolonic diverticulosis without CT evidence for diverticulitis. --Appendix: Normal. Vascular/Lymphatic: There are advanced atherosclerotic changes of the abdominal aorta. There is an infrarenal abdominal aortic aneurysm measuring approximately 3.6 cm on the sagittal  series (series 7, image 103). --No retroperitoneal lymphadenopathy. --No mesenteric lymphadenopathy. --No pelvic or inguinal lymphadenopathy. Reproductive: The prostate gland is enlarged. Other: There is a fat containing right inguinal hernia. The abdominal wall is normal. Musculoskeletal. There is a chronic appearing compression fracture of the T10 vertebral body. Degenerative changes are noted throughout the visualized thoracolumbar spine. IMPRESSION: 1. There is an infrarenal abdominal aortic aneurysm measuring approximately 3.6 cm. Recommend followup by ultrasound in 2 years. This recommendation follows ACR consensus guidelines: White Paper of the ACR Incidental Findings Committee II on Vascular Findings. J Am Coll Radiol 2013; 10:789-794. Aortic aneurysm NOS (ICD10-I71.9) 2. There is a large bladder stone measuring approximately 1.9 cm. 3. There is near pancolonic diverticulosis without CT evidence for diverticulitis. 4. There is a nonobstructing 8 mm stone in the lower pole the right kidney. 5. There is cholelithiasis without secondary signs of acute cholecystitis. 6. Prostatomegaly. 7. Large hiatal hernia. Aortic Atherosclerosis (ICD10-I70.0). Electronically Signed   By: Constance Holster M.D.   On: 01/10/2020 00:02   DG Chest Portable 1 View  Result Date: 01/09/2020 CLINICAL DATA:  Syncope. EXAM: PORTABLE CHEST 1 VIEW COMPARISON:  July 31, 2017 FINDINGS: Multiple sternal wires and vascular clips are seen. There is no evidence of acute infiltrate, pleural effusion or pneumothorax. The heart size and mediastinal contours are within normal limits. An artificial aortic valve is noted. Multilevel degenerative changes seen throughout the thoracic spine. IMPRESSION: 1. Evidence of prior median sternotomy. 2. Artificial aortic valve. 3. No acute or active cardiopulmonary disease. Electronically Signed   By: Virgina Norfolk M.D.   On: 01/09/2020 22:16   TIMI Risk Score for Unstable Angina or Non-ST  Elevation MI:   The patient's TIMI risk score is 5, which indicates a 26% risk of all cause mortality, new or recurrent myocardial infarction or need for urgent revascularization in the next 14 days.   Assessment and Plan:  Cory Bradley is a 86 y.o. male with DMT2, HTN, HLD, CKD, AAA, CAD s/p CABG x4 (2005), PVCs (42% PVC burden previously) on amiodarone and severe AS s/p TAVR 07/2017 who is being seen today for the evaluation of elevated troponin at the request of Dr. Tamera Punt.  # Presyncope The patient's presenting complaint was weakness, presyncope, diaphoresis and shortness of breath in the setting of a pounding heartbeat.  This is very similar to a presentation the patient had in 2017 where he was found to be in SVT and required adenosine from EMS.  Unfortunately, by the time EMS arrived during the episode today, the patient was feeling better and no arrhythmia was documented.  It is likely that today's episode did represent recurrent SVT versus VT given the patient's history of high PVC burden and ongoing multifocal PVCs on telemetry this evening.  Patient was hypotensive when EMS arrived likely related to this presumed arrhythmia.  Blood pressure has improved with supportive care and IV fluids. - Continue telemetry - Would likely benefit from outpatient monitor at the time of discharge -The patient is on propranolol at home and, when blood pressure tolerates, would resume this. -Continue supportive care with IV fluids  #NSTEMI The patient denies any chest pain during the episode and is overall very active on a daily basis walking 30 minutes a day.  He reports feeling back to normal and denies current chest pain as well.  Troponin elevation increased from 38-164.  EKG is reassuring without acute ischemia.  Most recent heart catheterization in 2018 showed severe three-vessel native coronary artery disease but 4-4 grafts patent.  Given the clinical context, it is likely that this represents a type II  NSTEMI related to demand ischemia in the setting of likely arrhythmia.  However, given insufficient data to distinctly determine presence of arrhythmia, will treat medically for NSTEMI tonight. -Patient received aspirin and would continue ASA 81 mg daily -Heparin drip initiated in the emergency department -Echocardiogram in the morning, most recent echo in 2019 revealed a normal ejection fraction -Pending results of echocardiogram and clinical course, will determine whether the patient requires heart catheterization for repeat evaluation given elevated troponin and known severe coronary disease  #AS s/p TAVR Well-functioning valve on prior echocardiogram.  Exam today is also reassuring, and similar to prior documented exams.  Thank you for this interesting consult, we will continue to follow      For questions or updates, please contact [place] Please consult [email or web address] for contact info under     Signed, Bryna Colander, MD  01/10/2020 1:31 AM

## 2020-01-10 NOTE — ED Notes (Signed)
Pt ambulated to bathroom with stand by assist. Pt tolerated well.

## 2020-01-10 NOTE — Progress Notes (Signed)
ANTICOAGULATION CONSULT NOTE - Follow Up Consult  Pharmacy Consult for heparin Indication: NSTEMI   Patient Measurements: Height: _0  (190.5 cm) Weight: 169 lb 9.6 oz (76.9 kg) IBW/kg (Calculated) : 84.5  Vital Signs: Temp: 98.3 F (36.8 C) (01/23 1438) Temp Source: Oral (01/23 1438) BP: 140/58 (01/23 1438) Pulse Rate: 66 (01/23 1438)  Labs: Recent Labs    01/09/20 2127 01/09/20 2127 01/09/20 2309 01/10/20 0418 01/10/20 0628 01/10/20 0835 01/10/20 1545  HGB 12.9*  --   --  11.4*  --   --   --   HCT 39.2  --   --  34.0*  --   --   --   PLT 155  --   --  137*  --   --   --   HEPARINUNFRC  --   --   --   --   --  0.45 0.47  CREATININE 1.94*  --   --  1.84*  --   --   --   TROPONINIHS 38*   < > 164* 790* 777*  --   --    < > = values in this interval not displayed.    Estimated Creatinine Clearance: 32.5 mL/min (A) (by C-G formula based on SCr of 1.84 mg/dL (H)).   Medical History: Past Medical History:  Diagnosis Date  . AAA (abdominal aortic aneurysm) (HCC)    3.3 cm infrarenal AAA 06/2017 CTA; 3 year f/u rec.  . Allergic rhinitis   . Aortic stenosis    a. Mild-mod by echo 01/2014. // b. Echo 6/18: Mod LVH EF 55-60, normal wall motion, Gr 2 DD, mod to severe AS (mean 40, peak 68; AVA by mean velocity 0.73 cm), mild AI, mod MAC, mild MR, severe LAE, mildly reduced RVSF, mod RAE  . CKD (chronic kidney disease), stage II   . Coronary artery disease    a. acute inferior MI s/p PTCA of RCA followed by CABGx4 in 2005,  . Diabetes mellitus ([place])   . ED (erectile dysfunction)   . Hyperlipidemia   . Hypertension   . Irregular heartbeat 10/2016  . Left carotid stenosis   . Memory loss   . Osteoarthritis   . Reflux   . Tobacco abuse     Assessment: 86yo male c/o weakness, SOB, dizziness, diaphoresis, and heart palpitations, troponin found to be elevated and increasing, to begin heparin.  Heparin level is therapeutic. Slightly thrombocytopenic but appears that way  at baseline.   Goal of Therapy:  Heparin level 0.3-0.7 units/ml Monitor platelets by anticoagulation protocol: Yes   Plan:  -Continue heparin at 900 units/hr -Daily heparin level, CBC -Monitor for bleeding  Vertis Kelch, PharmD, [facility] PGY2 Cardiology Pharmacy Resident Phone [phone] 01/10/2020       6:31 PM  Please check [email or web address] for unit-specific pharmacist phone numbers

## 2020-01-10 NOTE — ED Notes (Signed)
Tele   Breakfast ordered  

## 2020-01-10 NOTE — ED Notes (Signed)
Care handed over to Anna,RN

## 2020-01-10 NOTE — Progress Notes (Signed)
ANTICOAGULATION CONSULT NOTE - Initial Consult  Pharmacy Consult for heparin Indication: NSTEMI  Allergies  Allergen Reactions  . Penicillins Swelling    SWELLING, HANDS and FEET  Has patient had a PCN reaction causing immediate rash, facial/tongue/throat swelling, SOB or lightheadedness with hypotension: No Has patient had a PCN reaction causing severe rash involving mucus membranes or skin necrosis: No Has patient had a PCN reaction that required hospitalization: No Has patient had a PCN reaction occurring within the last 10 years: No  . Sulfa Antibiotics Itching    Patient Measurements: Height: _0  (190.5 cm) Weight: 175 lb (79.4 kg) IBW/kg (Calculated) : 84.5  Vital Signs: Temp: 97.6 F (36.4 C) (01/22 2136) Temp Source: Oral (01/22 2136) BP: 106/54 (01/23 0030) Pulse Rate: 58 (01/23 0030)  Labs: Recent Labs    01/09/20 2127 01/09/20 2309  HGB 12.9*  --   HCT 39.2  --   PLT 155  --   CREATININE 1.94*  --   TROPONINIHS 38* 164*    Estimated Creatinine Clearance: 31.8 mL/min (A) (by C-G formula based on SCr of 1.94 mg/dL (H)).   Medical History: Past Medical History:  Diagnosis Date  . AAA (abdominal aortic aneurysm) (HCC)    3.3 cm infrarenal AAA 06/2017 CTA; 3 year f/u rec.  . Allergic rhinitis   . Aortic stenosis    a. Mild-mod by echo 01/2014. // b. Echo 6/18: Mod LVH EF 55-60, normal wall motion, Gr 2 DD, mod to severe AS (mean 40, peak 68; AVA by mean velocity 0.73 cm), mild AI, mod MAC, mild MR, severe LAE, mildly reduced RVSF, mod RAE  . CKD (chronic kidney disease), stage II   . Coronary artery disease    a. acute inferior MI s/p PTCA of RCA followed by CABGx4 in 2005,  . Diabetes mellitus ([place])   . ED (erectile dysfunction)   . Hyperlipidemia   . Hypertension   . Irregular heartbeat 10/2016  . Left carotid stenosis   . Memory loss   . Osteoarthritis   . Reflux   . Tobacco abuse     Assessment: 86yo male c/o weakness, SOB, dizziness,  diaphoresis, and heart palpitations, troponin found to be elevated and increasing, to begin heparin.  Goal of Therapy:  Heparin level 0.3-0.7 units/ml Monitor platelets by anticoagulation protocol: Yes   Plan:  Will give heparin 3000 units IV bolus x1 followed by gtt at 900 units/hr and monitor heparin levels and CBC.  Wynona Neat, PharmD, BCPS  01/10/2020,12:39 AM

## 2020-01-10 NOTE — H&P (Addendum)
`  History and Physical    Cory Bradley LNL:[id number] DOB: [birth year] DOA: 01/09/2020  PCP: System, Pcp Not In  Patient coming from: Home.  Chief Complaint: Weakness and palpitation.  HPI: Cory Bradley is a 86 y.o. male with history of CAD status post CABG, status post TAVR, chronic kidney disease stage III baseline creatinine around 1.5, abdominal aortic aneurysm, diabetes mellitus type 2, hypertension last evening after dinner started developing palpitations like a chest pounding and felt dizzy.  Was feeling weak and felt that as if he is going to pass out.  When tested on this child.  Had a 1 episode of loose bowel movement.  Given the persistent symptoms EMS was called.  On arrival EMS found the patient was hypotensive was given fluid bolus and her pressure improved.  Patient was recently placed on clindamycin for tooth extraction.  ED Course: In the ER the patient symptoms are largely resolved.  CT abdomen pelvis is unremarkable.  Labs show worsening renal function with creatinine of 1.9 blood glucose of 203 hemoglobin 12.9.  Covid test was negative.  UA showing normal sinus rhythm.  Patient's high-sensitivity troponin increased from 38-1 64 at this point cardiology was notified and patient was started on heparin.  Admitted for further observation.  Review of Systems: As per HPI, rest all negative.   Past Medical History:  Diagnosis Date  . AAA (abdominal aortic aneurysm) (HCC)    3.3 cm infrarenal AAA 06/2017 CTA; 3 year f/u rec.  . Allergic rhinitis   . Aortic stenosis    a. Mild-mod by echo 01/2014. // b. Echo 6/18: Mod LVH EF 55-60, normal wall motion, Gr 2 DD, mod to severe AS (mean 40, peak 68; AVA by mean velocity 0.73 cm), mild AI, mod MAC, mild MR, severe LAE, mildly reduced RVSF, mod RAE  . CKD (chronic kidney disease), stage II   . Coronary artery disease    a. acute inferior MI s/p PTCA of RCA followed by CABGx4 in 2005,  . Diabetes mellitus ([place])   . ED (erectile  dysfunction)   . Hyperlipidemia   . Hypertension   . Irregular heartbeat 10/2016  . Left carotid stenosis   . Memory loss   . Osteoarthritis   . Reflux   . Tobacco abuse     Past Surgical History:  Procedure Laterality Date  . APPENDECTOMY    . CORONARY ARTERY BYPASS GRAFT    . ELBOW SURGERY    . RIGHT/LEFT HEART CATH AND CORONARY/GRAFT ANGIOGRAPHY N/A 05/28/2017   Procedure: Right/Left Heart Cath and Coronary/Graft Angiography;  Surgeon: Belva Crome, MD;  Location: [facility];  Service: Cardiovascular;  Laterality: N/A;  . TEE WITHOUT CARDIOVERSION N/A 07/31/2017   Procedure: TRANSESOPHAGEAL ECHOCARDIOGRAM (TEE);  Surgeon: Burnell Blanks, MD;  Location: [facility];  Service: Open Heart Surgery;  Laterality: N/A;  . TRANSCATHETER AORTIC VALVE REPLACEMENT, TRANSFEMORAL N/A 07/31/2017   Procedure: TRANSCATHETER AORTIC VALVE REPLACEMENT, TRANSFEMORAL;  Surgeon: Burnell Blanks, MD;  Location: [facility];  Service: Open Heart Surgery;  Laterality: N/A;     reports that he has quit smoking. His smoking use included cigarettes. He has a 8.00 pack-year smoking history. He has never used smokeless tobacco. He reports that he does not drink alcohol or use drugs.  Allergies  Allergen Reactions  . Penicillins Swelling    SWELLING, HANDS and FEET  Has patient had a PCN reaction causing immediate rash, facial/tongue/throat swelling, SOB or lightheadedness with hypotension: No  Has patient had a PCN reaction causing severe rash involving mucus membranes or skin necrosis: No Has patient had a PCN reaction that required hospitalization: No Has patient had a PCN reaction occurring within the last 10 years: No  . Sulfa Antibiotics Itching    Family History  Problem Relation Age of Onset  . Heart disease Father     Prior to Admission medications   Medication Sig Start Date End Date Taking? Authorizing Provider  aspirin EC 81 MG tablet Take 1 tablet (81 mg total) by mouth  daily. 05/16/17   Richardson Dopp T, PA-C  atorvastatin (LIPITOR) 80 MG tablet Take 80 mg by mouth daily.    [provider]  clindamycin (CLEOCIN) 150 MG capsule TAKE 4 CAPSULES BY MOUTH 30-60 MINUTES BEFORE PROCEDURE 01/08/19   Eileen Stanford, PA-C  clopidogrel (PLAVIX) 75 MG tablet Take 1 tablet (75 mg total) by mouth daily. Please make yearly appt with Dr. Tamala Julian for March before anymore refills. 1st attempt 01/01/20   Belva Crome, MD  Coenzyme Q10 10 MG capsule Take 100 mg by mouth daily.    [provider]  diphenhydrAMINE (BENADRYL) 25 MG tablet Take 25 mg by mouth at bedtime.     [provider]  donepezil (ARICEPT) 10 MG tablet Take 10 mg by mouth at bedtime.     [provider]  hydrochlorothiazide (MICROZIDE) 12.5 MG capsule Take 1 capsule (12.5 mg total) by mouth daily. 03/21/18 03/16/19  Belva Crome, MD  irbesartan (AVAPRO) 150 MG tablet Take 1 tablet (150 mg total) by mouth daily. 06/14/17   Tanda Rockers, MD  memantine (NAMENDA) 10 MG tablet Take 10 mg by mouth 2 (two) times daily.    [provider]  metFORMIN (GLUCOPHAGE) 500 MG tablet TAKE 1 TABLET (500 MG TOTAL) BY MOUTH AT BEDTIME 08/10/15   [provider]  niacin (NIASPAN) 1000 MG CR tablet Take 1 tablet (1,000 mg total) by mouth at bedtime. 08/10/17   Lyda Jester M, PA-C  Omega-3 Fatty Acids (FISH OIL) 1000 MG CAPS Take 1,000 mg by mouth 2 (two) times daily.    [provider]  omeprazole (PRILOSEC) 40 MG capsule Take 40 mg by mouth daily.    [provider]  propranolol ER (INDERAL LA) 120 MG 24 hr capsule Take 120 mg by mouth daily.    [provider]  Specialty Vitamins Products (PROSTATE) TABS Take 1 tablet by mouth daily.    [provider]  temazepam (RESTORIL) 15 MG capsule Take 15 mg by mouth at bedtime.  09/11/16   [provider]    Physical Exam: Constitutional: Moderately built and nourished. Vitals:    01/10/20 0230 01/10/20 0245 01/10/20 0300 01/10/20 0330  BP: 106/60 110/60 120/60 121/61  Pulse: (!) 58 (!) 54 (!) 55 (!) 52  Resp: (!) _0 Temp:      TempSrc:      SpO2: 97% 97% 100% 99%  Weight:      Height:       Eyes: Anicteric no pallor. ENMT: No discharge from the ears eyes nose or mouth. Neck: No mass felt.  No neck rigidity. Respiratory: No rhonchi or crepitations. Cardiovascular: S1-S2 heard. Abdomen: Soft nontender bowel sound present. Musculoskeletal: No edema.  No joint effusion. Skin: No rash. Neurologic: Alert awake oriented to time place and person.  Moves all extremities. Psychiatric: Appears normal normal affect.   Labs on Admission: I have personally reviewed following  labs and imaging studies  CBC: Recent Labs  Lab 01/09/20 2127  WBC 7.6  NEUTROABS 5.5  HGB 12.9*  HCT 39.2  MCV 96.6  PLT 161   Basic Metabolic Panel: Recent Labs  Lab 01/09/20 2127  NA 137  K 3.5  CL 105  CO2 24  GLUCOSE 203*  BUN 20  CREATININE 1.94*  CALCIUM 8.6*   GFR: Estimated Creatinine Clearance: 31.8 mL/min (A) (by C-G formula based on SCr of 1.94 mg/dL (H)). Liver Function Tests: Recent Labs  Lab 01/09/20 2127  AST 23  ALT 21  ALKPHOS 50  BILITOT 0.6  PROT 6.2*  ALBUMIN 3.7   No results for input(s): LIPASE, AMYLASE in the last 168 hours. No results for input(s): AMMONIA in the last 168 hours. Coagulation Profile: No results for input(s): INR, PROTIME in the last 168 hours. Cardiac Enzymes: No results for input(s): CKTOTAL, CKMB, CKMBINDEX, TROPONINI in the last 168 hours. BNP (last 3 results) No results for input(s): PROBNP in the last 8760 hours. HbA1C: No results for input(s): HGBA1C in the last 72 hours. CBG: Recent Labs  Lab 01/09/20 2151  GLUCAP 155*   Lipid Profile: No results for input(s): CHOL, HDL, LDLCALC, TRIG, CHOLHDL, LDLDIRECT in the last 72 hours. Thyroid Function Tests: No results for input(s): TSH, T4TOTAL, FREET4,  T3FREE, THYROIDAB in the last 72 hours. Anemia Panel: No results for input(s): VITAMINB12, FOLATE, FERRITIN, TIBC, IRON, RETICCTPCT in the last 72 hours. Urine analysis:    Component Value Date/Time   COLORURINE YELLOW 01/10/2020 0053   APPEARANCEUR HAZY (A) 01/10/2020 0053   LABSPEC 1.014 01/10/2020 0053   PHURINE 5.0 01/10/2020 0053   GLUCOSEU NEGATIVE 01/10/2020 0053   HGBUR SMALL (A) 01/10/2020 0053   BILIRUBINUR NEGATIVE 01/10/2020 0053   KETONESUR NEGATIVE 01/10/2020 0053   PROTEINUR 30 (A) 01/10/2020 0053   NITRITE NEGATIVE 01/10/2020 0053   LEUKOCYTESUR NEGATIVE 01/10/2020 0053   Sepsis Labs: _0 (procalcitonin:4,lacticidven:4) ) Recent Results (from the past 240 hour(s))  SARS CORONAVIRUS 2 (TAT 6-24 HRS) Nasopharyngeal Nasopharyngeal Swab     Status: None   Collection Time: 01/09/20 11:38 PM   Specimen: Nasopharyngeal Swab  Result Value Ref Range Status   SARS Coronavirus 2 NEGATIVE NEGATIVE Final    Comment: (NOTE) SARS-CoV-2 target nucleic acids are NOT DETECTED. The SARS-CoV-2 RNA is generally detectable in upper and lower respiratory specimens during the acute phase of infection. Negative results do not preclude SARS-CoV-2 infection, do not rule out co-infections with other pathogens, and should not be used as the sole basis for treatment or other patient management decisions. Negative results must be combined with clinical observations, patient history, and epidemiological information. The expected result is Negative. Fact Sheet for Patients: SugarRoll.be Fact Sheet for Healthcare Providers: https://www.woods-mathews.com/ This test is not yet approved or cleared by the [place] FDA and  has been authorized for detection and/or diagnosis of SARS-CoV-2 by FDA under an Emergency Use Authorization (EUA). This EUA will remain  in effect (meaning this test can be used) for the duration of the COVID-19 declaration  under Section 56 4(b)(1) of the Act, 21 U.S.C. section 360bbb-3(b)(1), unless the authorization is terminated or revoked sooner. Performed at [facility], [place] [address], [place], [place] [address]      Radiological Exams on Admission: CT ABDOMEN PELVIS WO CONTRAST  Result Date: 01/10/2020 CLINICAL DATA:  AAA. Diarrhea and nausea for 2 hours. EXAM: CT ABDOMEN AND PELVIS WITHOUT CONTRAST TECHNIQUE: Multidetector CT imaging of the abdomen  and pelvis was performed following the standard protocol without IV contrast. COMPARISON:  CT dated 04/11/2017. FINDINGS: Lower chest: The lung bases are clear. The heart size is normal. Hepatobiliary: The liver is normal. Cholelithiasis without acute inflammation.There is no biliary ductal dilation. Pancreas: Normal contours without ductal dilatation. No peripancreatic fluid collection. Spleen: No splenic laceration or hematoma. Adrenals/Urinary Tract: --Adrenal glands: No adrenal hemorrhage. --Right kidney/ureter: There is a nonobstructing stone in the lower pole the right kidney measuring approximately 8 mm. There is no right-sided hydronephrosis. There is severe cortical thinning of the lower pole the right kidney. --Left kidney/ureter: No hydronephrosis or perinephric hematoma. --Urinary bladder: There is a large bladder stone in the dependent portion of the urinary bladder measuring approximately 1.9 cm. Stomach/Bowel: --Stomach/Duodenum: There is a large hiatal hernia. --Small bowel: No dilatation or inflammation. --Colon: There is near pancolonic diverticulosis without CT evidence for diverticulitis. --Appendix: Normal. Vascular/Lymphatic: There are advanced atherosclerotic changes of the abdominal aorta. There is an infrarenal abdominal aortic aneurysm measuring approximately 3.6 cm on the sagittal series (series 7, image 103). --No retroperitoneal lymphadenopathy. --No mesenteric lymphadenopathy. --No pelvic or inguinal lymphadenopathy. Reproductive: The  prostate gland is enlarged. Other: There is a fat containing right inguinal hernia. The abdominal wall is normal. Musculoskeletal. There is a chronic appearing compression fracture of the T10 vertebral body. Degenerative changes are noted throughout the visualized thoracolumbar spine. IMPRESSION: 1. There is an infrarenal abdominal aortic aneurysm measuring approximately 3.6 cm. Recommend followup by ultrasound in 2 years. This recommendation follows ACR consensus guidelines: White Paper of the ACR Incidental Findings Committee II on Vascular Findings. J Am Coll Radiol 2013; 10:789-794. Aortic aneurysm NOS (ICD10-I71.9) 2. There is a large bladder stone measuring approximately 1.9 cm. 3. There is near pancolonic diverticulosis without CT evidence for diverticulitis. 4. There is a nonobstructing 8 mm stone in the lower pole the right kidney. 5. There is cholelithiasis without secondary signs of acute cholecystitis. 6. Prostatomegaly. 7. Large hiatal hernia. Aortic Atherosclerosis (ICD10-I70.0). Electronically Signed   By: Constance Holster M.D.   On: 01/10/2020 00:02   DG Chest Portable 1 View  Result Date: 01/09/2020 CLINICAL DATA:  Syncope. EXAM: PORTABLE CHEST 1 VIEW COMPARISON:  July 31, 2017 FINDINGS: Multiple sternal wires and vascular clips are seen. There is no evidence of acute infiltrate, pleural effusion or pneumothorax. The heart size and mediastinal contours are within normal limits. An artificial aortic valve is noted. Multilevel degenerative changes seen throughout the thoracic spine. IMPRESSION: 1. Evidence of prior median sternotomy. 2. Artificial aortic valve. 3. No acute or active cardiopulmonary disease. Electronically Signed   By: Virgina Norfolk M.D.   On: 01/09/2020 22:16    EKG: Independently reviewed.  Normal sinus rhythm.  Assessment/Plan Principal Problem:   Non-STEMI (non-ST elevated myocardial infarction) ([place]) Active Problems:   CKD (chronic kidney disease), stage II    Essential hypertension   CAD (coronary artery disease)   SVT (supraventricular tachycardia) (HCC)   AKI (acute kidney injury) ([place])   Near syncope   Hyperglycemia    1. Non-ST elevation MI with history of CAD status post CABG appreciate cardiology consult at this time cardiology feels patient's elevated troponin could be from demand ischemia from possible palpitations.  On aspirin statins beta-blockers and heparin.  2D echo has been ordered. 2. Near syncopal episode could be precipitated by the 3 palpitations.  At this time cardiology closely monitoring and telemetry since patient has had history of SVT 3 years ago which has not had  any recurrence.  Also rule out any major arrhythmias including V. fib and V. Tach. 3. Acute on chronic kidney disease stage III likely from dehydration holding hydrochlorothiazide and ARB gently hydrating.  Follow metabolic panel. 4. History of dementia on Namenda and Aricept. 5. Hypertension holding ARB and hydrochlorothiazide due to worsening renal function.  If blood pressure allows will continue propranolol.  As needed IV hydralazine for now. 6. Diabetes mellitus type 2 we will keep patient on sliding scale coverage. 7. Status post TAVR check 2D echo.  Given the non-ST elevation MI with near syncopal episode with worsening renal function will need close monitoring for any further deterioration and will need inpatient status.   DVT prophylaxis: Heparin. Code Status: Full code. Family Communication: Discussed with patient. Disposition Plan: Home. Consults called: Cardiology. Admission status: Inpatient.   Rise Patience MD [facility] Pager [phone].  If 7PM-7AM, please contact night-coverage [email or web address] Password [facility]  01/10/2020, 3:51 AM

## 2020-01-10 NOTE — Progress Notes (Signed)
This is a very pleasant 86 year old male who was admitted early this morning by Dr. Toniann Fail story of CAD status post CABG, status post TAVR, CKD 3, AAA, type 2 diabetes, hypertension who had sudden onset generalized weakness and palpitations with an episode of loose bowel movement, found to be hypotensive by EMS.  In ED troponin increased from 38> 164-> 790 and cardiology was consulted.  Patient remained hemodynamically stable on room air.  Currently patient states he feels well and is ready to go home.  He denies any complaints at this time such as chest pain, palpitations or generalized weakness.   Physical exam: General: A and O x3 Heart: S1 and S2 auscultated with systolic murmur in aortic region Lungs: Clear to auscultation bilaterally lung fields  A/P: 1. NSTEMI with cardiac history 1. Continue telemetry 2. Continue aspirin, statin, Plavix, heparin drip 3. Follow-up cardiology recommendations 4. Follow-up echo 2. Generalized weakness with near syncopal episode likely secondary to hypotensive episode, possibly arrhythmia with history of SVT  1. PT eval 3. AKI on CKD 3 secondary to dehydration hemodynamic changes 1. Improved with IV fluids 2. Continue holding ARB and HCTZ 4. History of dementia on Namenda and Aricept 5. Hypertension, holding ARB and HCTZ 6. Type 2 diabetes on sliding scale 7. Status post TAVR  Whitney Post, DO [facility]  Pager: [phone]

## 2020-01-10 NOTE — Progress Notes (Signed)
ANTICOAGULATION CONSULT NOTE - Initial Consult  Pharmacy Consult for heparin Indication: NSTEMI   Patient Measurements: Height: _0  (190.5 cm) Weight: 175 lb (79.4 kg) IBW/kg (Calculated) : 84.5  Vital Signs: BP: 113/64 (01/23 0900) Pulse Rate: 57 (01/23 0900)  Labs: Recent Labs    01/09/20 2127 01/09/20 2127 01/09/20 2309 01/10/20 0418 01/10/20 0628 01/10/20 0835  HGB 12.9*  --   --  11.4*  --   --   HCT 39.2  --   --  34.0*  --   --   PLT 155  --   --  137*  --   --   HEPARINUNFRC  --   --   --   --   --  0.45  CREATININE 1.94*  --   --  1.84*  --   --   TROPONINIHS 38*   < > 164* 790* 777*  --    < > = values in this interval not displayed.    Estimated Creatinine Clearance: 33.6 mL/min (A) (by C-G formula based on SCr of 1.84 mg/dL (H)).   Medical History: Past Medical History:  Diagnosis Date  . AAA (abdominal aortic aneurysm) (HCC)    3.3 cm infrarenal AAA 06/2017 CTA; 3 year f/u rec.  . Allergic rhinitis   . Aortic stenosis    a. Mild-mod by echo 01/2014. // b. Echo 6/18: Mod LVH EF 55-60, normal wall motion, Gr 2 DD, mod to severe AS (mean 40, peak 68; AVA by mean velocity 0.73 cm), mild AI, mod MAC, mild MR, severe LAE, mildly reduced RVSF, mod RAE  . CKD (chronic kidney disease), stage II   . Coronary artery disease    a. acute inferior MI s/p PTCA of RCA followed by CABGx4 in 2005,  . Diabetes mellitus ([place])   . ED (erectile dysfunction)   . Hyperlipidemia   . Hypertension   . Irregular heartbeat 10/2016  . Left carotid stenosis   . Memory loss   . Osteoarthritis   . Reflux   . Tobacco abuse     Assessment: 86yo male c/o weakness, SOB, dizziness, diaphoresis, and heart palpitations, troponin found to be elevated and increasing, to begin heparin.  Initial heparin level is therapeutic. Slightly thrombocytopenic but appears that way at baseline.   Goal of Therapy:  Heparin level 0.3-0.7 units/ml Monitor platelets by anticoagulation protocol:  Yes   Plan:  -Continue heparin at 900 units/hr -Daily HL, CBC -Check level this afternoon   Harvel Quale 01/10/2020 12:36 PM

## 2020-01-11 ENCOUNTER — Encounter (HOSPITAL_COMMUNITY): Payer: Self-pay | Admitting: Internal Medicine

## 2020-01-11 ENCOUNTER — Inpatient Hospital Stay (HOSPITAL_COMMUNITY): Payer: Medicare PPO

## 2020-01-11 DIAGNOSIS — I351 Nonrheumatic aortic (valve) insufficiency: Secondary | ICD-10-CM

## 2020-01-11 DIAGNOSIS — I10 Essential (primary) hypertension: Secondary | ICD-10-CM

## 2020-01-11 DIAGNOSIS — I214 Non-ST elevation (NSTEMI) myocardial infarction: Principal | ICD-10-CM

## 2020-01-11 DIAGNOSIS — I25810 Atherosclerosis of coronary artery bypass graft(s) without angina pectoris: Secondary | ICD-10-CM

## 2020-01-11 DIAGNOSIS — R55 Syncope and collapse: Secondary | ICD-10-CM

## 2020-01-11 DIAGNOSIS — I340 Nonrheumatic mitral (valve) insufficiency: Secondary | ICD-10-CM

## 2020-01-11 LAB — CBC
HCT: 33.7 % — ABNORMAL LOW (ref 39.0–52.0)
Hemoglobin: 11.1 g/dL — ABNORMAL LOW (ref 13.0–17.0)
MCH: 31.2 pg (ref 26.0–34.0)
MCHC: 32.9 g/dL (ref 30.0–36.0)
MCV: 94.7 fL (ref 80.0–100.0)
Platelets: 127 10*3/uL — ABNORMAL LOW (ref 150–400)
RBC: 3.56 MIL/uL — ABNORMAL LOW (ref 4.22–5.81)
RDW: 12.9 % (ref 11.5–15.5)
WBC: 8.1 10*3/uL (ref 4.0–10.5)
nRBC: 0 % (ref 0.0–0.2)

## 2020-01-11 LAB — BASIC METABOLIC PANEL
Anion gap: 9 (ref 5–15)
BUN: 20 mg/dL (ref 8–23)
CO2: 21 mmol/L — ABNORMAL LOW (ref 22–32)
Calcium: 8.3 mg/dL — ABNORMAL LOW (ref 8.9–10.3)
Chloride: 110 mmol/L (ref 98–111)
Creatinine, Ser: 1.53 mg/dL — ABNORMAL HIGH (ref 0.61–1.24)
GFR calc Af Amer: 48 mL/min — ABNORMAL LOW (ref >60)
GFR calc non Af Amer: 41 mL/min — ABNORMAL LOW (ref >60)
Glucose, Bld: 87 mg/dL (ref 70–99)
Potassium: 3.6 mmol/L (ref 3.5–5.1)
Sodium: 140 mmol/L (ref 135–145)

## 2020-01-11 LAB — HEPARIN LEVEL (UNFRACTIONATED): Heparin Unfractionated: 0.59 IU/mL (ref 0.30–0.70)

## 2020-01-11 LAB — ECHOCARDIOGRAM COMPLETE
Height: 75 in
Weight: 2704 oz

## 2020-01-11 LAB — GLUCOSE, CAPILLARY
Glucose-Capillary: 92 mg/dL (ref 70–99)
Glucose-Capillary: 77 mg/dL (ref 70–99)
Glucose-Capillary: 90 mg/dL (ref 70–99)

## 2020-01-11 LAB — MAGNESIUM: Magnesium: 1.2 mg/dL — ABNORMAL LOW (ref 1.7–2.4)

## 2020-01-11 MED ORDER — MAGNESIUM OXIDE 400 (241.3 MG) MG PO TABS
400.0000 mg | ORAL_TABLET | Freq: Two times a day (BID) | ORAL | Status: DC
  Administered 2020-01-11: 400 mg via ORAL
  Filled 2020-01-11: qty 1

## 2020-01-11 MED ORDER — IRBESARTAN 300 MG PO TABS
150.0000 mg | ORAL_TABLET | Freq: Every day | ORAL | Status: DC
  Administered 2020-01-11: 150 mg via ORAL
  Filled 2020-01-11: qty 1

## 2020-01-11 MED ORDER — MAGNESIUM OXIDE 400 (241.3 MG) MG PO TABS
400.0000 mg | ORAL_TABLET | Freq: Every day | ORAL | Status: DC
  Filled 2020-01-11: qty 1

## 2020-01-11 MED ORDER — MAGNESIUM OXIDE 400 (241.3 MG) MG PO TABS: 400.0000 mg | ORAL_TABLET | Freq: Two times a day (BID) | ORAL | 1 refills | Status: AC

## 2020-01-11 NOTE — Progress Notes (Signed)
Progress Note  Patient Name: Cory Bradley Date of Encounter: 01/11/2020  Primary Cardiologist: Lesleigh Noe, MD   Subjective   Feeling well.  Denies abdominal pain prior to his episode.  He felt fine by the time EMS arrived.  No recurrent symptoms since being at the hospital.  Denies HF symptoms or exertional symptoms.   Inpatient Medications    Scheduled Meds: . aspirin EC  81 mg Oral Daily  . atorvastatin  80 mg Oral Daily  . clopidogrel  75 mg Oral Daily  . donepezil  10 mg Oral QHS  . insulin aspart  0-9 Units Subcutaneous TID WC  . memantine  10 mg Oral BID  . niacin  1,000 mg Oral QHS  . omega-3 acid ethyl esters  1 g Oral BID  . pantoprazole  40 mg Oral Daily  . propranolol ER  120 mg Oral Daily   Continuous Infusions: . heparin 900 Units/hr (01/11/20 0250)   PRN Meds: acetaminophen **OR** acetaminophen, temazepam   Vital Signs    Vitals:   01/11/20 0525 01/11/20 0526 01/11/20 0528 01/11/20 0754  BP: (!) 155/76   (!) 153/66  Pulse: (!) 48 62  60  Resp: 18   20  Temp: 97.9 F (36.6 C)   97.8 F (36.6 C)  TempSrc: Oral   Oral  SpO2: 96% 98%  96%  Weight:   76.7 kg   Height:        Intake/Output Summary (Last 24 hours) at 01/11/2020 0946 Last data filed at 01/11/2020 0529 Gross per 24 hour  Intake 950.86 ml  Output 625 ml  Net 325.86 ml   Last 3 Weights 01/11/2020 01/10/2020 01/09/2020  Weight (lbs) 169 lb 169 lb 9.6 oz 175 lb  Weight (kg) 76.658 kg 76.93 kg 79.379 kg      Telemetry    Sinus rhythm.  ?atrial tachycardia.  Frequent PVCs and ventricular bigeminy. - Personally Reviewed  ECG     Sinus rhythm.  Rate 65 bpm.  Incomplete RBBB.  Prior inferior infarct- Personally Reviewed  Physical Exam   VS:  BP (!) 153/66 (BP Location: Right Arm)   Pulse 60   Temp 97.8 F (36.6 C) (Oral)   Resp 20   Ht 6\' 3"  (1.905 m)   Wt 76.7 kg   SpO2 96%   BMI 21.12 kg/m  , BMI Body mass index is 21.12 kg/m. GENERAL:  Well appearing HEENT:  Pupils equal round and reactive, fundi not visualized, oral mucosa unremarkable NECK:  No jugular venous distention, waveform within normal limits, carotid upstroke brisk and symmetric, no bruits LUNGS:  Clear to auscultation bilaterally HEART:  RRR.  PMI not displaced or sustained,S1 and S2 within normal limits, no S3, no S4, no clicks, no rubs, II/VI sysotlic murmur at the LUSB ABD:  Flat, positive bowel sounds normal in frequency in pitch, no bruits, no rebound, no guarding, no midline pulsatile mass, no hepatomegaly, no splenomegaly EXT:  2 plus pulses throughout, no edema, no cyanosis no clubbing SKIN:  No rashes no nodules NEURO:  Cranial nerves II through XII grossly intact, motor grossly intact throughout PSYCH:  Cognitively intact, oriented to person place and time   Labs    High Sensitivity Troponin:   Recent Labs  Lab 01/09/20 2127 01/09/20 2309 01/10/20 0418 01/10/20 0628  TROPONINIHS 38* 164* 790* 777*      Chemistry Recent Labs  Lab 01/09/20 2127 01/10/20 0418 01/11/20 0555  NA 137 139 140  K 3.5 3.9 3.6  CL 105 107 110  CO2 24 23 21*  GLUCOSE 203* 97 87  BUN 20 23 20   CREATININE 1.94* 1.84* 1.53*  CALCIUM 8.6* 8.2* 8.3*  PROT 6.2* 5.2*  --   ALBUMIN 3.7 3.1*  --   AST 23 24  --   ALT 21 19  --   ALKPHOS 50 47  --   BILITOT 0.6 0.3  --   GFRNONAA 31* 33* 41*  GFRAA 36* 38* 48*  ANIONGAP 8 9 9      Hematology Recent Labs  Lab 01/09/20 2127 01/10/20 0418 01/11/20 0555  WBC 7.6 8.4 8.1  RBC 4.06* 3.56* 3.56*  HGB 12.9* 11.4* 11.1*  HCT 39.2 34.0* 33.7*  MCV 96.6 95.5 94.7  MCH 31.8 32.0 31.2  MCHC 32.9 33.5 32.9  RDW 13.0 13.1 12.9  PLT 155 137* 127*    BNPNo results for input(s): BNP, PROBNP in the last 168 hours.   DDimer No results for input(s): DDIMER in the last 168 hours.   Radiology    CT ABDOMEN PELVIS WO CONTRAST  Result Date: 01/10/2020 CLINICAL DATA:  AAA. Diarrhea and nausea for 2 hours. EXAM: CT ABDOMEN AND PELVIS WITHOUT  CONTRAST TECHNIQUE: Multidetector CT imaging of the abdomen and pelvis was performed following the standard protocol without IV contrast. COMPARISON:  CT dated 04/11/2017. FINDINGS: Lower chest: The lung bases are clear. The heart size is normal. Hepatobiliary: The liver is normal. Cholelithiasis without acute inflammation.There is no biliary ductal dilation. Pancreas: Normal contours without ductal dilatation. No peripancreatic fluid collection. Spleen: No splenic laceration or hematoma. Adrenals/Urinary Tract: --Adrenal glands: No adrenal hemorrhage. --Right kidney/ureter: There is a nonobstructing stone in the lower pole the right kidney measuring approximately 8 mm. There is no right-sided hydronephrosis. There is severe cortical thinning of the lower pole the right kidney. --Left kidney/ureter: No hydronephrosis or perinephric hematoma. --Urinary bladder: There is a large bladder stone in the dependent portion of the urinary bladder measuring approximately 1.9 cm. Stomach/Bowel: --Stomach/Duodenum: There is a large hiatal hernia. --Small bowel: No dilatation or inflammation. --Colon: There is near pancolonic diverticulosis without CT evidence for diverticulitis. --Appendix: Normal. Vascular/Lymphatic: There are advanced atherosclerotic changes of the abdominal aorta. There is an infrarenal abdominal aortic aneurysm measuring approximately 3.6 cm on the sagittal series (series 7, image 103). --No retroperitoneal lymphadenopathy. --No mesenteric lymphadenopathy. --No pelvic or inguinal lymphadenopathy. Reproductive: The prostate gland is enlarged. Other: There is a fat containing right inguinal hernia. The abdominal wall is normal. Musculoskeletal. There is a chronic appearing compression fracture of the T10 vertebral body. Degenerative changes are noted throughout the visualized thoracolumbar spine. IMPRESSION: 1. There is an infrarenal abdominal aortic aneurysm measuring approximately 3.6 cm. Recommend followup  by ultrasound in 2 years. This recommendation follows ACR consensus guidelines: White Paper of the ACR Incidental Findings Committee II on Vascular Findings. J Am Coll Radiol 2013; 10:789-794. Aortic aneurysm NOS (ICD10-I71.9) 2. There is a large bladder stone measuring approximately 1.9 cm. 3. There is near pancolonic diverticulosis without CT evidence for diverticulitis. 4. There is a nonobstructing 8 mm stone in the lower pole the right kidney. 5. There is cholelithiasis without secondary signs of acute cholecystitis. 6. Prostatomegaly. 7. Large hiatal hernia. Aortic Atherosclerosis (ICD10-I70.0). Electronically Signed   By: 04/13/2017 M.D.   On: 01/10/2020 00:02   DG Chest Portable 1 View  Result Date: 01/09/2020 CLINICAL DATA:  Syncope. EXAM: PORTABLE CHEST 1 VIEW COMPARISON:  July 31, 2017 FINDINGS:  Multiple sternal wires and vascular clips are seen. There is no evidence of acute infiltrate, pleural effusion or pneumothorax. The heart size and mediastinal contours are within normal limits. An artificial aortic valve is noted. Multilevel degenerative changes seen throughout the thoracic spine. IMPRESSION: 1. Evidence of prior median sternotomy. 2. Artificial aortic valve. 3. No acute or active cardiopulmonary disease. Electronically Signed   By: Virgina Norfolk M.D.   On: 01/09/2020 22:16    Cardiac Studies   Echo pending  Patient Profile     86 y.o. male with CAD s/p CABG (2005), hypertension, hyperlipidemia, CKD 3, PVCs (42%), severe AS s/p TAVR (2018) admitted with presyncope and NSTEMI.  Assessment & Plan    # Presyncope: Occurred in the setting of diarrhea, though he had no abdominal pain preceding the episode.  This makes it less likely that it was a vagal episode.  He has a history of SVT which presented very similarly.  By the time EMS arrived he was hypotensive but no longer feeling poorly.  He did feel his heart racing during the event.  On telemetry he has some sinus  tachycardia vs. Atrial tachycardia in the low 100s, but no SVT.  Echo is pending.  If systolic function is normal and valve is stable, plan for outpatient monitor.  Supplement magnesium.   # Demand ischemia: # CAD s/p CABG:  High sensitivity troponin elevated to 790.  He denies chest pain and exercises regularly without symptoms.  No ischemic changes on EKG.  This is likely demand ischemia in the setting of arrhythmia and hypotension.  Continue aspirin, clopidogrel, atorvastatin, and propranolol.   # Frequent PVCs:  Chronic issue.  He is asymptomatic.  He continues to have frequent PVCs and ventricular bigeminy. Resume propranolol.   # Hypertension:  BP elevated.  His home irbesartan, HCTZ and propranolol have been held 2/2 presyncope.  Will resume irbesartan and propranolol.   # s/p TAVR: Valve seems stable clinically.  Echo pending.       For questions or updates, please contact [place] Please consult [email or web address] for contact info under        Signed, Skeet Latch, MD  01/11/2020, 9:46 AM

## 2020-01-11 NOTE — Progress Notes (Signed)
  Echocardiogram 2D Echocardiogram has been performed.  Cory Bradley 01/11/2020, 10:07 AM

## 2020-01-11 NOTE — Progress Notes (Signed)
Discharge and medication education given with teach back. Questions and concerns answered.  peripheral iv removed and dressing applied.  Records release forms signed by pt and sent to pt's PCP and pt; per pt request. Pt's belongings with pt: clothes, shoes, wallet, and cell phone. Taxi called and voucher given to pt.  Pt wheeled to valet entrance by nurse tech.

## 2020-01-11 NOTE — Progress Notes (Signed)
   01/11/20 1209  Vitals  Patient Position (if appropriate) Orthostatic Vitals  Orthostatic Lying   BP- Lying 121/56  Pulse- Lying 70  Orthostatic Sitting  BP- Sitting 115/54  Pulse- Sitting 81  Orthostatic Standing at 0 minutes  BP- Standing at 0 minutes 110/60  Pulse- Standing at 0 minutes 87  Orthostatic Standing at 3 minutes  BP- Standing at 3 minutes 108/76  Pulse- Standing at 3 minutes 108

## 2020-01-11 NOTE — Discharge Summary (Signed)
Physician Discharge Summary  Cory GroutCharles E Bradley ZOX:[id number] DOB: 07/15/1933 DOA: 01/09/2020  PCP: System, Pcp Not In  Admit date: 01/09/2020 Discharge date: 01/11/2020   Code Status: Full Code  Admitted From: Home Discharged to: Home Home Health: No Equipment/Devices: No Discharge Condition: Stable  Recommendations for Outpatient Follow-up   1. Follow up with PCP in 1 week 2. Please follow up BMP/CBC/magnesium 3. Follow-up BP as HCTZ was discontinued 4. Started on magnesium supplementation for hypomagnesemia 5. To be set up for outpatient monitor  Hospital Summary  This is a very pleasant 86 year old male who with a history of CAD status post CABG, status post TAVR, CKD 3, AAA, type 2 diabetes, hypertension who had sudden onset generalized weakness and palpitations with an episode of loose bowel movement, found to be hypotensive by EMS.  In ED troponin increased from 38> 164-> 790 and cardiology was consulted.  He had resolution of his symptoms following IV fluids.  Troponin improved.  Cardiology believes symptoms to be due to demand ischemia secondary to presyncopal episode from hypertension/dehydration and possibly arrhythmia. Patient remained hemodynamically stable on room air throughout hospitalization and resolution of symptoms.  Echo was similar to baseline.  Discharged in stable condition with HCTZ discontinued and outpatient monitor to be set up by cardio.  A & P   Principal Problem:   Non-STEMI (non-ST elevated myocardial infarction) (HCC) Active Problems:   CKD (chronic kidney disease), stage II   Essential hypertension   CAD (coronary artery disease)   SVT (supraventricular tachycardia) (HCC)   AKI (acute kidney injury) (HCC)   Near syncope   Hyperglycemia   1. NSTEMI with cardiac history, suspected demand ischemia secondary to presyncopal episode/hypotension 1. HS troponin 38> 164-> 790 -> 777.  Started on heparin drip on admission.  Sinus tachycardia on telemetry BS.   Atrial tachycardia in low 100s but no SVT 2. Echo similar to baseline 3. To follow-up with cardiology outpatient with outpatient monitor 2. Presyncope, likely secondary to hypotension, possible tachyarrhythmia 1. Antihypertensives discontinued at admission 2. Negative orthostatics 3. Remain dynamically stable without recurrence of symptoms 4. Need for PT as patient seems at baseline strength and ambulates independently 3. Hypomagnesemia, Mg 1.2 1. Started on Mag-Ox 400 mg twice daily with follow-up labs outpatient 4. AKI on CKD 3 secondary to dehydration, hemodynamic changes 1. Resolved with IV fluids and holding ARB and HCTZ 2. Home meds reinstituted with the exception of HCTZ which was discontinued at discharge 5. History of dementia on Namenda and Aricept 6. Hypertension 1. continue propranolol, irbesartan.   2. Discontinued HCTZ 7. Type 2 diabetes on sliding scale 8. Status post TAVR 1. Echo unremarkable for acute valvular abnormality 9. Infrarenal 3.6 cm AAA  1. Recommend follow-up ultrasound in 2 years 10. Large bladder stone, nonobstructing 8 mm right renal stone 1. Follow-up outpatient    Consultants  . Cardiology  Procedures  . Echo  Antibiotics  None   Subjective  Patient lying flat resting comfortably no acute distress on room air.  Denies any recurrence of symptoms admits to having diet and anticipating eating lunch.  Currently without any complaints eagerly anticipating discharge.  Objective   Discharge Exam: Vitals:   01/11/20 1057 01/11/20 1130  BP: (!) 161/76 (!) 142/78  Pulse: 71 69  Resp:  20  Temp:  97.9 F (36.6 C)  SpO2:  96%   Vitals:   01/11/20 0528 01/11/20 0754 01/11/20 1057 01/11/20 1130  BP:  (!) 153/66 (!) 161/76 (!) 142/78  Pulse:  60 71 69  Resp:  20  20  Temp:  97.8 F (36.6 C)  97.9 F (36.6 C)  TempSrc:  Oral  Oral  SpO2:  96%  96%  Weight: 76.7 kg     Height:        Physical Exam Vitals and nursing note reviewed.   Constitutional:      Appearance: Normal appearance.  HENT:     Head: Normocephalic.     Mouth/Throat:     Mouth: Mucous membranes are moist.  Eyes:     Conjunctiva/sclera: Conjunctivae normal.  Cardiovascular:     Rate and Rhythm: Normal rate and regular rhythm.  Pulmonary:     Effort: Pulmonary effort is normal. No respiratory distress.  Abdominal:     General: Abdomen is flat. There is no distension.  Musculoskeletal:        General: No swelling. Normal range of motion.     Cervical back: Normal range of motion.  Neurological:     General: No focal deficit present.     Mental Status: He is alert.  Psychiatric:        Mood and Affect: Mood normal.        Thought Content: Thought content normal.        Judgment: Judgment normal.       The results of significant diagnostics from this hospitalization (including imaging, microbiology, ancillary and laboratory) are listed below for reference.     Microbiology: Recent Results (from the past 240 hour(s))  SARS CORONAVIRUS 2 (TAT 6-24 HRS) Nasopharyngeal Nasopharyngeal Swab     Status: None   Collection Time: 01/09/20 11:38 PM   Specimen: Nasopharyngeal Swab  Result Value Ref Range Status   SARS Coronavirus 2 NEGATIVE NEGATIVE Final    Comment: (NOTE) SARS-CoV-2 target nucleic acids are NOT DETECTED. The SARS-CoV-2 RNA is generally detectable in upper and lower respiratory specimens during the acute phase of infection. Negative results do not preclude SARS-CoV-2 infection, do not rule out co-infections with other pathogens, and should not be used as the sole basis for treatment or other patient management decisions. Negative results must be combined with clinical observations, patient history, and epidemiological information. The expected result is Negative. Fact Sheet for Patients: SugarRoll.be Fact Sheet for Healthcare Providers: https://www.woods-mathews.com/ This test is not  yet approved or cleared by the [place] FDA and  has been authorized for detection and/or diagnosis of SARS-CoV-2 by FDA under an Emergency Use Authorization (EUA). This EUA will remain  in effect (meaning this test can be used) for the duration of the COVID-19 declaration under Section 56 4(b)(1) of the Act, 21 U.S.C. section 360bbb-3(b)(1), unless the authorization is terminated or revoked sooner. Performed at [facility], [place] [address], [place], [place] [address]      Labs: BNP (last 3 results) No results for input(s): BNP in the last 8760 hours. Basic Metabolic Panel: Recent Labs  Lab 01/09/20 2127 01/10/20 0418 01/11/20 0555  NA 137 139 140  K 3.5 3.9 3.6  CL 105 107 110  CO2 24 23 21*  GLUCOSE 203* 97 87  BUN 20 23 20   CREATININE 1.94* 1.84* 1.53*  CALCIUM 8.6* 8.2* 8.3*  MG  --   --  1.2*   Liver Function Tests: Recent Labs  Lab 01/09/20 2127 01/10/20 0418  AST 23 24  ALT 21 19  ALKPHOS 50 47  BILITOT 0.6 0.3  PROT 6.2* 5.2*  ALBUMIN 3.7 3.1*   No results for  input(s): LIPASE, AMYLASE in the last 168 hours. No results for input(s): AMMONIA in the last 168 hours. CBC: Recent Labs  Lab 01/09/20 2127 01/10/20 0418 01/11/20 0555  WBC 7.6 8.4 8.1  NEUTROABS 5.5 5.5  --   HGB 12.9* 11.4* 11.1*  HCT 39.2 34.0* 33.7*  MCV 96.6 95.5 94.7  PLT 155 137* 127*   Cardiac Enzymes: No results for input(s): CKTOTAL, CKMB, CKMBINDEX, TROPONINI in the last 168 hours. BNP: Invalid input(s): POCBNP CBG: Recent Labs  Lab 01/10/20 0801 01/10/20 1724 01/10/20 2135 01/11/20 0615 01/11/20 1126  GLUCAP 84 82 89 77 90   D-Dimer No results for input(s): DDIMER in the last 72 hours. Hgb A1c No results for input(s): HGBA1C in the last 72 hours. Lipid Profile No results for input(s): CHOL, HDL, LDLCALC, TRIG, CHOLHDL, LDLDIRECT in the last 72 hours. Thyroid function studies Recent Labs    01/10/20 0418  TSH 1.178   Anemia work up No results for  input(s): VITAMINB12, FOLATE, FERRITIN, TIBC, IRON, RETICCTPCT in the last 72 hours. Urinalysis    Component Value Date/Time   COLORURINE YELLOW 01/10/2020 0053   APPEARANCEUR HAZY (A) 01/10/2020 0053   LABSPEC 1.014 01/10/2020 0053   PHURINE 5.0 01/10/2020 0053   GLUCOSEU NEGATIVE 01/10/2020 0053   HGBUR SMALL (A) 01/10/2020 0053   BILIRUBINUR NEGATIVE 01/10/2020 0053   KETONESUR NEGATIVE 01/10/2020 0053   PROTEINUR 30 (A) 01/10/2020 0053   NITRITE NEGATIVE 01/10/2020 0053   LEUKOCYTESUR NEGATIVE 01/10/2020 0053   Sepsis Labs Invalid input(s): PROCALCITONIN,  WBC,  LACTICIDVEN Microbiology Recent Results (from the past 240 hour(s))  SARS CORONAVIRUS 2 (TAT 6-24 HRS) Nasopharyngeal Nasopharyngeal Swab     Status: None   Collection Time: 01/09/20 11:38 PM   Specimen: Nasopharyngeal Swab  Result Value Ref Range Status   SARS Coronavirus 2 NEGATIVE NEGATIVE Final    Comment: (NOTE) SARS-CoV-2 target nucleic acids are NOT DETECTED. The SARS-CoV-2 RNA is generally detectable in upper and lower respiratory specimens during the acute phase of infection. Negative results do not preclude SARS-CoV-2 infection, do not rule out co-infections with other pathogens, and should not be used as the sole basis for treatment or other patient management decisions. Negative results must be combined with clinical observations, patient history, and epidemiological information. The expected result is Negative. Fact Sheet for Patients: HairSlick.no Fact Sheet for Healthcare Providers: quierodirigir.com This test is not yet approved or cleared by the [place] FDA and  has been authorized for detection and/or diagnosis of SARS-CoV-2 by FDA under an Emergency Use Authorization (EUA). This EUA will remain  in effect (meaning this test can be used) for the duration of the COVID-19 declaration under Section 56 4(b)(1) of the Act, 21 U.S.C. section  360bbb-3(b)(1), unless the authorization is terminated or revoked sooner. Performed at [facility], [address], [place], [place] [address]     Discharge Instructions     Discharge Instructions    Diet - low sodium heart healthy   Complete by: As directed    Discharge instructions   Complete by: As directed    You were seen and examined in the hospital for generalized weakness and cared for by a hospitalist and cardiologist  Upon Discharge:  - Stop taking hydrochlorothiazide - Start taking magnesium oxide by mouth twice daily - get repeat lab work later this week - make an appointment with your primary care physician  - you are going to be set up to have a cardiac monitor  as an outpatient  Bring all home medications to your appointment to review Request that your primary physician go over all hospital tests and procedures/radiological results at the follow up.   Please get all hospital records sent to your physician by signing a hospital release before you go home.     Read the complete instructions along with all the possible side effects for all the medicines you take and that have been prescribed to you. Take any new medicines after you have completely understood and accept all the possible adverse reactions/side effects.   If you have any questions about your discharge medications or the care you received while you were in the hospital, you can call the unit and asked to speak with the hospitalist on call. Once you are discharged, your primary care physician will handle any further medical issues. Please note that NO REFILLS for any discharge medications will be authorized, as it is imperative that you return to your primary care physician (or establish a relationship with a primary care physician if you do not have one) for your aftercare needs so that they can reassess your need for medications and monitor your lab values.   Do not drive, operate heavy machinery,  perform activities at heights, swimming or participation in water activities or provide baby sitting services if your were admitted for loss of consciousness/seizures or if you are on sedating medications including, but not limited to benzodiazepines, sleep medications, narcotic pain medications, etc., until you have been cleared to do so by a medical doctor.   Do not take more than prescribed medications.   Wear a seat belt while driving.  If you have smoked or chewed Tobacco in the last 2 years please stop smoking; also stop any regular Alcohol and/or any Recreational drug use including marijuana.  If you experience worsening of your admission symptoms or develop shortness of breath, chest pain, suicidal or homicidal thoughts or experience a life threatening emergency, you must seek medical attention immediately by calling 911 or calling your PCP immediately.   Increase activity slowly   Complete by: As directed      Allergies as of 01/11/2020      Reactions   Penicillins Swelling   SWELLING, HANDS and FEET Has patient had a PCN reaction causing immediate rash, facial/tongue/throat swelling, SOB or lightheadedness with hypotension: No Has patient had a PCN reaction causing severe rash involving mucus membranes or skin necrosis: No Has patient had a PCN reaction that required hospitalization: No Has patient had a PCN reaction occurring within the last 10 years: No   Sulfa Antibiotics Itching      Medication List    STOP taking these medications   hydrochlorothiazide 12.5 MG capsule Commonly known as: MICROZIDE     TAKE these medications   aspirin EC 81 MG tablet Take 1 tablet (81 mg total) by mouth daily.   atorvastatin 80 MG tablet Commonly known as: LIPITOR Take 80 mg by mouth daily.   clindamycin 150 MG capsule Commonly known as: CLEOCIN TAKE 4 CAPSULES BY MOUTH 30-60 MINUTES BEFORE PROCEDURE   clopidogrel 75 MG tablet Commonly known as: PLAVIX Take 1 tablet (75 mg  total) by mouth daily. Please make yearly appt with Dr. Katrinka Blazing for March before anymore refills. 1st attempt   Coenzyme Q10 10 MG capsule Take 100 mg by mouth daily.   diphenhydrAMINE 25 MG tablet Commonly known as: BENADRYL Take 25 mg by mouth at bedtime.   donepezil 10 MG tablet Commonly  known as: ARICEPT Take 10 mg by mouth at bedtime.   Fish Oil 1000 MG Caps Take 1,000 mg by mouth 2 (two) times daily.   irbesartan 150 MG tablet Commonly known as: Avapro Take 1 tablet (150 mg total) by mouth daily.   magnesium oxide 400 (241.3 Mg) MG tablet Commonly known as: MAG-OX Take 1 tablet (400 mg total) by mouth 2 (two) times daily.   memantine 10 MG tablet Commonly known as: NAMENDA Take 10 mg by mouth 2 (two) times daily.   metFORMIN 500 MG tablet Commonly known as: GLUCOPHAGE TAKE 1 TABLET (500 MG TOTAL) BY MOUTH AT BEDTIME   niacin 1000 MG CR tablet Commonly known as: NIASPAN Take 1 tablet (1,000 mg total) by mouth at bedtime.   omeprazole 40 MG capsule Commonly known as: PRILOSEC Take 40 mg by mouth daily.   propranolol ER 120 MG 24 hr capsule Commonly known as: INDERAL LA Take 120 mg by mouth daily.   Prostate Tabs Take 1 tablet by mouth daily.   temazepam 15 MG capsule Commonly known as: RESTORIL Take 15 mg by mouth at bedtime.       Allergies  Allergen Reactions  . Penicillins Swelling    SWELLING, HANDS and FEET  Has patient had a PCN reaction causing immediate rash, facial/tongue/throat swelling, SOB or lightheadedness with hypotension: No Has patient had a PCN reaction causing severe rash involving mucus membranes or skin necrosis: No Has patient had a PCN reaction that required hospitalization: No Has patient had a PCN reaction occurring within the last 10 years: No  . Sulfa Antibiotics Itching    Time coordinating discharge: Over 30 minutes   SIGNED:   Jae Dire, D.O. [facility] Pager: [phone]  01/11/2020, 12:50 PM

## 2020-01-11 NOTE — Progress Notes (Signed)
Patient ambulating in the hallway with physical therapy 

## 2020-01-11 NOTE — Progress Notes (Signed)
ANTICOAGULATION CONSULT NOTE - Follow Up Consult  Pharmacy Consult for heparin Indication: NSTEMI   Patient Measurements: Height: _0  (190.5 cm) Weight: 169 lb (76.7 kg) IBW/kg (Calculated) : 84.5  Heparin dosing weight:   Vital Signs: Temp: 97.8 F (36.6 C) (01/24 0754) Temp Source: Oral (01/24 0754) BP: 153/66 (01/24 0754) Pulse Rate: 60 (01/24 0754)  Labs: Recent Labs    01/09/20 2127 01/09/20 2127 01/09/20 2309 01/10/20 0418 01/10/20 0628 01/10/20 0835 01/10/20 1545 01/11/20 0555  HGB 12.9*   < >  --  11.4*  --   --   --  11.1*  HCT 39.2  --   --  34.0*  --   --   --  33.7*  PLT 155  --   --  137*  --   --   --  127*  HEPARINUNFRC  --   --   --   --   --  0.45 0.47 0.59  CREATININE 1.94*  --   --  1.84*  --   --   --  1.53*  TROPONINIHS 38*   < > 164* 790* 777*  --   --   --    < > = values in this interval not displayed.    Estimated Creatinine Clearance: 39 mL/min (A) (by C-G formula based on SCr of 1.53 mg/dL (H)).   Medical History: Past Medical History:  Diagnosis Date  . AAA (abdominal aortic aneurysm) (HCC)    3.3 cm infrarenal AAA 06/2017 CTA; 3 year f/u rec.  . Allergic rhinitis   . Aortic stenosis    a. Mild-mod by echo 01/2014. // b. Echo 6/18: Mod LVH EF 55-60, normal wall motion, Gr 2 DD, mod to severe AS (mean 40, peak 68; AVA by mean velocity 0.73 cm), mild AI, mod MAC, mild MR, severe LAE, mildly reduced RVSF, mod RAE  . CKD (chronic kidney disease), stage II   . Coronary artery disease    a. acute inferior MI s/p PTCA of RCA followed by CABGx4 in 2005,  . Diabetes mellitus ([place])   . ED (erectile dysfunction)   . Hyperlipidemia   . Hypertension   . Irregular heartbeat 10/2016  . Left carotid stenosis   . Memory loss   . Osteoarthritis   . Reflux   . Tobacco abuse     Assessment: 85yo male c/o weakness, SOB, dizziness, diaphoresis, and heart palpitations, troponin found to be elevated, pharmacy consulted to dose heparin. No  anticoagulation noted PTA.  Heparin level this morning remains therapeutic at 0.59 on 900 units/hour. his H&H is stable today at 11.1/33.7, plts are decreasing, today at 127. Of note, he is on DAPT with aspirin and clopidogrel and low platelets are noted in the past as well.    Goal of Therapy:  Heparin level 0.3-0.7 units/ml Monitor platelets by anticoagulation protocol: Yes   Plan:  -Continue heparin at 900 units/hr -Daily heparin level, CBC -Monitor for bleeding, monitor downtrend of platelets    Thank you,   Eddie Candle, PharmD PGY-1 Pharmacy Resident   Please check amion for clinical pharmacist contact number

## 2020-01-11 NOTE — Evaluation (Signed)
Physical Therapy Evaluation Patient Details Name: Cory Bradley MRN: [id number] DOB: 1933-10-14 Today's Date: 01/11/2020   History of Present Illness  This is a very pleasant 86 year old male who with a history of CAD status post CABG, status post TAVR, CKD 3, AAA, type 2 diabetes, hypertension who had sudden onset generalized weakness and palpitations with an episode of loose bowel movement, found to be hypotensive by EMS.  Clinical Impression  Pt evaluated for mobility and found pt at baseline level. Orthostatics taken with no issues and entered into flowsheets. Pt with no feelings of dizziness or lightheadedness during session.  Pt has good awareness of safety. Will d/c PT at this time and no PT needed after d/c from acute care.    Follow Up Recommendations No PT follow up    Equipment Recommendations  None recommended by PT    Recommendations for Other Services       Precautions / Restrictions Precautions Precautions: None Restrictions Weight Bearing Restrictions: No      Mobility  Bed Mobility Overal bed mobility: Modified Independent                Transfers Overall transfer level: Modified independent                  Ambulation/Gait Ambulation/Gait assistance: Supervision Gait Distance (Feet): 480 Feet Assistive device: None     Gait velocity interpretation: >4.37 ft/sec, indicative of normal walking speed General Gait Details: Pt ambulated with S in hallway with no difficulty managing obstacles and with components of the dynamic gait index.  Stairs            Wheelchair Mobility    Modified Rankin (Stroke Patients Only)       Balance Overall balance assessment: No apparent balance deficits (not formally assessed)                                           Pertinent Vitals/Pain Pain Assessment: No/denies pain    Home Living Family/patient expects to be discharged to:: Private residence Living Arrangements:  Alone   Type of Home: House Home Access: Stairs to enter Entrance Stairs-Rails: Can reach both;Left;Right Entrance Stairs-Number of Steps: 6 Home Layout: One level Home Equipment: Cane - single point      Prior Function Level of Independence: Independent               Hand Dominance        Extremity/Trunk Assessment   Upper Extremity Assessment Upper Extremity Assessment: Defer to OT evaluation    Lower Extremity Assessment Lower Extremity Assessment: Defer to PT evaluation       Communication   Communication: No difficulties  Cognition Arousal/Alertness: Awake/alert Behavior During Therapy: WFL for tasks assessed/performed Overall Cognitive Status: Within Functional Limits for tasks assessed                                        General Comments      Exercises     Assessment/Plan    PT Assessment Patent does not need any further PT services  PT Problem List         PT Treatment Interventions      PT Goals (Current goals can be found in the Care Plan section)  Acute Rehab PT Goals  PT Goal Formulation: All assessment and education complete, DC therapy    Frequency     Barriers to discharge        Co-evaluation               AM-PAC PT "6 Clicks" Mobility  Outcome Measure Help needed turning from your back to your side while in a flat bed without using bedrails?: None Help needed moving from lying on your back to sitting on the side of a flat bed without using bedrails?: None Help needed moving to and from a bed to a chair (including a wheelchair)?: None Help needed standing up from a chair using your arms (e.g., wheelchair or bedside chair)?: None Help needed to walk in hospital room?: None Help needed climbing 3-5 steps with a railing? : None 6 Click Score: 24    End of Session   Activity Tolerance: Patient tolerated treatment well Patient left: in chair Nurse Communication: Mobility status PT Visit Diagnosis:  Other abnormalities of gait and mobility (R26.89)    Time: 7169-6789 PT Time Calculation (min) (ACUTE ONLY): 20 min   Charges:   PT Evaluation $PT Eval Low Complexity: 1 Low          Hayk Divis L. Tamala Julian, [place] Pager [phone] 01/11/2020   Galen Manila 01/11/2020, 1:11 PM

## 2020-03-23 NOTE — Progress Notes (Signed)
Cardiology Office Note:    Date:  03/24/2020   ID:  Cory Bradley, DOB August 17, 1933, MRN [id number]  PCP:  System, Pcp Not In  Cardiologist:  Sinclair Grooms, MD   Referring MD: No ref. provider found   Chief Complaint  Patient presents with  . Cardiac Valve Problem    History of Present Illness:    Cory Bradley is a 86 y.o. male with a hx of DMT2, HTN, HLD, CKD, CAD s/p CABG x4 (2005), PVCs (42% PVC burden previously) on amiodarone, chronic diarrhea for greater than 6 months, and severe AS s/p TAVR 07/2017.[name]   Cory Bradley was admitted to the hospital on January 09, 2020 because of palpitations.  No arrhythmia was documented.  He did have low blood pressure.  He did have a flat low elevation in troponin.  Ischemic evaluation was not performed.  After arrhythmias settle down, the patient felt well.  He has had palpitations in the past that are brief.  These episodes are very similar to what he had on admission.  The reason he was admitted at this time is that the episodes lasted for greater than 30 minutes.  This is highly unusual.  He has had no recurrence since that time.  Past Medical History:  Diagnosis Date  . AAA (abdominal aortic aneurysm) (HCC)    3.3 cm infrarenal AAA 06/2017 CTA; 3 year f/u rec.  . Allergic rhinitis   . Aortic stenosis    a. Mild-mod by echo 01/2014. // b. Echo 6/18: Mod LVH EF 55-60, normal wall motion, Gr 2 DD, mod to severe AS (mean 40, peak 68; AVA by mean velocity 0.73 cm), mild AI, mod MAC, mild MR, severe LAE, mildly reduced RVSF, mod RAE  . CKD (chronic kidney disease), stage II   . Coronary artery disease    a. acute inferior MI s/p PTCA of RCA followed by CABGx4 in 2005,  . Diabetes mellitus ([place])   . ED (erectile dysfunction)   . Hyperlipidemia   . Hypertension   . Irregular heartbeat 10/2016  . Left carotid stenosis   . Memory loss   . Osteoarthritis   . Reflux   . Tobacco abuse     Past Surgical History:  Procedure Laterality Date  .  APPENDECTOMY    . CORONARY ARTERY BYPASS GRAFT    . ELBOW SURGERY    . RIGHT/LEFT HEART CATH AND CORONARY/GRAFT ANGIOGRAPHY N/A 05/28/2017   Procedure: Right/Left Heart Cath and Coronary/Graft Angiography;  Surgeon: Belva Crome, MD;  Location: [facility];  Service: Cardiovascular;  Laterality: N/A;  . TEE WITHOUT CARDIOVERSION N/A 07/31/2017   Procedure: TRANSESOPHAGEAL ECHOCARDIOGRAM (TEE);  Surgeon: Burnell Blanks, MD;  Location: [facility];  Service: Open Heart Surgery;  Laterality: N/A;  . TRANSCATHETER AORTIC VALVE REPLACEMENT, TRANSFEMORAL N/A 07/31/2017   Procedure: TRANSCATHETER AORTIC VALVE REPLACEMENT, TRANSFEMORAL;  Surgeon: Burnell Blanks, MD;  Location: [facility];  Service: Open Heart Surgery;  Laterality: N/A;    Current Medications: Current Meds  Medication Sig  . aspirin EC 81 MG tablet Take 1 tablet (81 mg total) by mouth daily.  [name] atorvastatin (LIPITOR) 80 MG tablet Take 80 mg by mouth daily.  . clindamycin (CLEOCIN) 150 MG capsule TAKE 4 CAPSULES BY MOUTH 30-60 MINUTES BEFORE PROCEDURE  . clopidogrel (PLAVIX) 75 MG tablet Take 1 tablet (75 mg total) by mouth daily. Please make yearly appt with Dr. Tamala Julian for March before anymore refills. 1st attempt  . Coenzyme  Q10 10 MG capsule Take 100 mg by mouth daily.  . diphenhydrAMINE (BENADRYL) 25 MG tablet Take 25 mg by mouth at bedtime.   . diphenoxylate-atropine (LOMOTIL) 2.5-0.025 MG tablet Take 1 tablet by mouth 3 (three) times daily as needed.  . donepezil (ARICEPT) 10 MG tablet Take 10 mg by mouth at bedtime.   . irbesartan (AVAPRO) 150 MG tablet Take 1 tablet (150 mg total) by mouth daily.  . magnesium oxide (MAG-OX) 400 MG tablet Take 400 mg by mouth 2 (two) times daily.  . memantine (NAMENDA) 10 MG tablet Take 10 mg by mouth 2 (two) times daily.  . metFORMIN (GLUCOPHAGE) 500 MG tablet TAKE 1 TABLET (500 MG TOTAL) BY MOUTH AT BEDTIME  . niacin (NIASPAN) 1000 MG CR tablet Take 1 tablet (1,000 mg total) by  mouth at bedtime.  . Omega-3 Fatty Acids (FISH OIL) 1000 MG CAPS Take 1,000 mg by mouth 2 (two) times daily.  [name] omeprazole (PRILOSEC) 40 MG capsule Take 40 mg by mouth daily.  . propranolol ER (INDERAL LA) 120 MG 24 hr capsule Take 120 mg by mouth daily.  [name] Specialty Vitamins Products (PROSTATE) TABS Take 1 tablet by mouth daily.  . temazepam (RESTORIL) 15 MG capsule Take 15 mg by mouth at bedtime.      Allergies:   Penicillins and Sulfa antibiotics   Social History   Socioeconomic History  . Marital status: Widowed    Spouse name: Not on file  . Number of children: 3  . Years of education: Not on file  . Highest education level: Not on file  Occupational History  . Occupation: Professor at [employer]  . Smoking status: Former Smoker    Packs/day: 0.20    Years: 40.00    Pack years: 8.00    Types: Cigarettes  . Smokeless tobacco: Never Used  Substance and Sexual Activity  . Alcohol use: No    Alcohol/week: 0.0 standard drinks  . Drug use: No  . Sexual activity: Not on file  Other Topics Concern  . Not on file  Social History Narrative  . Not on file   Social Determinants of Health   Financial Resource Strain:   . Difficulty of Paying Living Expenses:   Food Insecurity:   . Worried About Charity fundraiser in the Last Year:   . Arboriculturist in the Last Year:   Transportation Needs:   . Film/video editor (Medical):   [name] Lack of Transportation (Non-Medical):   Physical Activity:   . Days of Exercise per Week:   . Minutes of Exercise per Session:   Stress:   . Feeling of Stress :   Social Connections:   . Frequency of Communication with Friends and Family:   . Frequency of Social Gatherings with Friends and Family:   . Attends Religious Services:   . Active Member of Clubs or Organizations:   . Attends Archivist Meetings:   [name] Marital Status:      Family History: The patient's family history includes Heart disease in his  father.  ROS:   Please see the history of present illness.    Diarrhea for the past 2 to 3 months.  Having weight loss.  Has talked to primary care about it ([facility]).  All other systems reviewed and are negative.  EKGs/Labs/Other Studies Reviewed:    The following studies were reviewed today: No new data  EKG:  EKG  EKGs from the hospital stay were reviewed.  They reveal sinus rhythm without acute ST-T wave change.  Recent Labs: 01/10/2020: ALT 19; TSH 1.178 01/11/2020: BUN 20; Creatinine, Ser 1.53; Hemoglobin 11.1; Magnesium 1.2; Platelets 127; Potassium 3.6; Sodium 140  Recent Lipid Panel    Component Value Date/Time   CHOL 126 07/01/2019 0838   TRIG 49 07/01/2019 0838   HDL 69 07/01/2019 0838   CHOLHDL 1.8 07/01/2019 0838   CHOLHDL 2.1 11/07/2007 0305   VLDL 10 11/07/2007 0305   LDLCALC 47 07/01/2019 0838    Physical Exam:    VS:  BP (!) 142/78   Pulse 71   Ht _0  (1.905 m)   Wt 167 lb 12.8 oz (76.1 kg)   SpO2 98%   BMI 20.97 kg/m     Wt Readings from Last 3 Encounters:  03/24/20 167 lb 12.8 oz (76.1 kg)  01/11/20 169 lb (76.7 kg)  02/26/19 172 lb 9.6 oz (78.3 kg)     GEN: Slender and compatible with age in appearance. No acute distress HEENT: Normal NECK: No JVD. LYMPHATICS: No lymphadenopathy CARDIAC:  RRR without murmur, gallop, or edema. VASCULAR:  Normal Pulses. No bruits. RESPIRATORY:  Clear to auscultation without rales, wheezing or rhonchi  ABDOMEN: Soft, non-tender, non-distended, No pulsatile mass, MUSCULOSKELETAL: No deformity  SKIN: Warm and dry NEUROLOGIC:  Alert and oriented x 3 PSYCHIATRIC:  Normal affect   ASSESSMENT:    1. S/P TAVR (transcatheter aortic valve replacement)   2. Coronary artery disease involving coronary bypass graft of native heart without angina pectoris   3. Essential hypertension   4. CKD (chronic kidney disease), stage II   5. Hyperlipidemia, unspecified hyperlipidemia type    6. AAA (abdominal aortic aneurysm) without rupture ([place])   7. Type 2 diabetes mellitus with complication, without long-term current use of insulin ([place])   8. Educated about COVID-19 virus infection    PLAN:    In order of problems listed above:  1. Auscultation and recent echo suggests normal function. 2. CAD without symptoms of angina.  Secondary prevention discussed. 3. Blood pressure is stable for his age.  Target 140/80 mmHg. 4. Stable stage IV CKD. 5. Most recent LDL was less than 70 when reported in July. 6. Not discussed 7. Most recent hemoglobin A1c 5.5 8. Vaccine has been received.  Social distancing is being practiced.  Clinical follow-up in 1 year.   Medication Adjustments/Labs and Tests Ordered: Current medicines are reviewed at length with the patient today.  Concerns regarding medicines are outlined above.  No orders of the defined types were placed in this encounter.  No orders of the defined types were placed in this encounter.   Patient Instructions  Medication Instructions:  Your physician recommends that you continue on your current medications as directed. Please refer to the Current Medication list given to you today.  *If you need a refill on your cardiac medications before your next appointment, please call your pharmacy*   Lab Work: None If you have labs (blood work) drawn today and your tests are completely normal, you will receive your results only by: [name] MyChart Message (if you have MyChart) OR . A paper copy in the mail If you have any lab test that is abnormal or we need to change your treatment, we will call you to review the results.   Testing/Procedures: None   Follow-Up: At [facility], you and your health needs are our priority.  As part of our continuing mission  to provide you with exceptional heart care, we have created designated Provider Care Teams.  These Care Teams include your primary Cardiologist (physician) and Advanced  Practice Providers (APPs -  Physician Assistants and Nurse Practitioners) who all work together to provide you with the care you need, when you need it.  We recommend signing up for the patient portal called "MyChart".  Sign up information is provided on this After Visit Summary.  MyChart is used to connect with patients for Virtual Visits (Telemedicine).  Patients are able to view lab/test results, encounter notes, upcoming appointments, etc.  Non-urgent messages can be sent to your provider as well.   To learn more about what you can do with MyChart, go to [email or web address].    Your next appointment:   12 month(s)  The format for your next appointment:   In Person  Provider:   You may see Sinclair Grooms, MD or one of the following Advanced Practice Providers on your designated Care Team:    Truitt Merle, NP  Cecilie Kicks, NP  Kathyrn Drown, NP    Other Instructions      Signed, Sinclair Grooms, MD  03/24/2020 4:07 PM    [place]

## 2020-03-24 ENCOUNTER — Ambulatory Visit: Payer: Medicare PPO | Admitting: Interventional Cardiology

## 2020-03-24 ENCOUNTER — Encounter: Payer: Self-pay | Admitting: Interventional Cardiology

## 2020-03-24 ENCOUNTER — Other Ambulatory Visit: Payer: Self-pay

## 2020-03-24 VITALS — BP 142/78 | HR 71 | Ht 75.0 in | Wt 167.8 lb

## 2020-03-24 DIAGNOSIS — N182 Chronic kidney disease, stage 2 (mild): Secondary | ICD-10-CM | POA: Diagnosis not present

## 2020-03-24 DIAGNOSIS — I25810 Atherosclerosis of coronary artery bypass graft(s) without angina pectoris: Secondary | ICD-10-CM

## 2020-03-24 DIAGNOSIS — E785 Hyperlipidemia, unspecified: Secondary | ICD-10-CM

## 2020-03-24 DIAGNOSIS — I714 Abdominal aortic aneurysm, without rupture, unspecified: Secondary | ICD-10-CM

## 2020-03-24 DIAGNOSIS — Z952 Presence of prosthetic heart valve: Secondary | ICD-10-CM | POA: Diagnosis not present

## 2020-03-24 DIAGNOSIS — I10 Essential (primary) hypertension: Secondary | ICD-10-CM | POA: Diagnosis not present

## 2020-03-24 DIAGNOSIS — E118 Type 2 diabetes mellitus with unspecified complications: Secondary | ICD-10-CM

## 2020-03-24 DIAGNOSIS — Z7189 Other specified counseling: Secondary | ICD-10-CM

## 2020-03-24 NOTE — Patient Instructions (Signed)

## 2020-07-22 ENCOUNTER — Encounter: Payer: Self-pay | Admitting: Neurology

## 2020-08-04 ENCOUNTER — Other Ambulatory Visit: Payer: Self-pay | Admitting: Interventional Cardiology

## 2020-09-29 ENCOUNTER — Encounter: Payer: Self-pay | Admitting: Neurology

## 2020-10-25 ENCOUNTER — Ambulatory Visit: Payer: Medicare PPO | Admitting: Neurology

## 2020-11-15 ENCOUNTER — Other Ambulatory Visit: Payer: Self-pay

## 2020-11-15 DIAGNOSIS — R202 Paresthesia of skin: Secondary | ICD-10-CM

## 2020-11-16 ENCOUNTER — Other Ambulatory Visit: Payer: Self-pay

## 2020-11-16 ENCOUNTER — Ambulatory Visit: Payer: Medicare PPO | Admitting: Neurology

## 2020-11-16 DIAGNOSIS — R202 Paresthesia of skin: Secondary | ICD-10-CM | POA: Diagnosis not present

## 2020-11-16 DIAGNOSIS — M5416 Radiculopathy, lumbar region: Secondary | ICD-10-CM

## 2020-11-16 DIAGNOSIS — G629 Polyneuropathy, unspecified: Secondary | ICD-10-CM

## 2020-11-16 NOTE — Procedures (Addendum)
[facility]  [address] [place], [address]  [place], [place] [address] Tel: [phone] Fax:  [phone] Test Date:  11/16/2020  Patient: Cory Bradley DOB: Apr 20, 1933 Physician: Nita Sickle, DO  Sex: Male Height: 6\' 3"  Ref Phys: , MD  ID#: [id number]   Technician:    Patient Complaints: This is a 86 year old man referred for evaluation of left leg numbness.  NCV & EMG Findings: Extensive electrodiagnostic testing of the left lower extremity and additional studies of the right shows:  1. Bilateral sural and superficial peroneal sensory responses are absent. 2. Bilateral peroneal motor responses show reduced amplitude (L1.3, R1.7 mV) at the extensor digitorum brevis, and normal amplitude at the tibialis anterior.  Bilateral tibial motor responses are within normal limits. 3. Bilateral tibial H reflex studies show prolonged latency. 4. Chronic motor axonal loss changes are seen affecting the bilateral flexor digitorum longus, left tibialis anterior, and left gluteus medius muscles.  There is no evidence of accompanied active denervation.  Impression: 1. The electrophysiologic findings are consistent with a chronic sensorimotor axonal polyneuropathy affecting the lower extremities.   2. There is also evidence of a superimposed chronic L5 radiculopathy affecting the left lower extremity, mild.    ___________________________ 85, DO    Nerve Conduction Studies Anti Sensory Summary Table   Stim Site NR Peak (ms) Norm Peak (ms) P-T Amp (V) Norm P-T Amp  Left Sup Peroneal Anti Sensory (Ant Lat Mall)  32C  12 cm NR  <4.6  >3  Right Sup Peroneal Anti Sensory (Ant Lat Mall)  32C  12 cm NR  <4.6  >3  Left Sural Anti Sensory (Lat Mall)  32C  Calf NR  <4.6  >3  Right Sural Anti Sensory (Lat Mall)  32C  Calf NR  <4.6  >3   Motor Summary Table   Stim Site NR Onset (ms) Norm Onset (ms) O-P Amp (mV) Norm O-P Amp Site1 Site2 Delta-0 (ms) Dist (cm) Vel  (m/s) Norm Vel (m/s)  Left Peroneal Motor (Ext Dig Brev)  32C  Ankle    4.1 <6.0 1.3 >2.5 B Fib Ankle 10.3 41.0 40 >40  B Fib    14.4  1.0  Poplt B Fib 1.9 8.0 42 >40  Poplt    16.3  1.0         Right Peroneal Motor (Ext Dig Brev)  32C  Ankle    5.0 <6.0 1.7 >2.5 B Fib Ankle 9.5 38.0 40 >40  B Fib    14.5  1.6  Poplt B Fib 2.0 8.0 40 >40  Poplt    16.5  1.6         Left Peroneal TA Motor (Tib Ant)  32C  Fib Head    3.9 <4.5 3.3 >3 Poplit Fib Head 1.8 8.0 44 >40  Poplit    5.7  3.0         Right Peroneal TA Motor (Tib Ant)  32C  Fib Head    3.4 <4.5 3.9 >3 Poplit Fib Head 1.3 8.0 62 >40  Poplit    4.7  3.9         Left Tibial Motor (Abd Hall Brev)  32C  Ankle    3.6 <6.0 8.6 >4 Knee Ankle 10.5 44.0 42 >40  Knee    14.1  6.1         Right Tibial Motor (Abd Hall Brev)  32C  Ankle    5.2 <6.0 5.3 >4 Knee Ankle 11.0  45.0 41 >40  Knee    16.2  3.8          H Reflex Studies   NR H-Lat (ms) Lat Norm (ms) L-R H-Lat (ms)  Left Tibial (Gastroc)  32C     36.05 <35 0.00  Right Tibial (Gastroc)  32C     36.05 <35 0.00   EMG   Side Muscle Ins Act Fibs Psw Fasc Number Recrt Dur Dur. Amp Amp. Poly Poly. Comment  Left AntTibialis Nml Nml Nml Nml 1- Rapid Some 1+ Some 1+ Some 1+ N/A  Left Gastroc Nml Nml Nml Nml Nml Nml Nml Nml Nml Nml Nml Nml N/A  Left Flex Dig Long Nml Nml Nml Nml 1- Rapid Some 1+ Some 1+ Some 1+ N/A  Left RectFemoris Nml Nml Nml Nml Nml Nml Nml Nml Nml Nml Nml Nml N/A  Left GluteusMed Nml Nml Nml Nml 1- Rapid Some 1+ Some 1+ Some 1+ N/A  Right AntTibialis Nml Nml Nml Nml Nml Nml Nml Nml Nml Nml Nml Nml N/A  Right Gastroc Nml Nml Nml Nml Nml Nml Nml Nml Nml Nml Nml Nml N/A  Right Flex Dig Long Nml Nml Nml Nml 1- Rapid Some 1+ Some 1+ Some 1+ N/A  Right RectFemoris Nml Nml Nml Nml Nml Nml Nml Nml Nml Nml Nml Nml N/A  Right GluteusMed Nml Nml Nml Nml Nml Nml Nml Nml Nml Nml Nml Nml N/A      Waveforms:

## 2021-07-06 ENCOUNTER — Other Ambulatory Visit: Payer: Self-pay

## 2022-06-02 ENCOUNTER — Encounter: Payer: Self-pay | Admitting: Neurology

## 2022-10-31 ENCOUNTER — Ambulatory Visit: Payer: Medicare PPO | Admitting: Neurology

## 2023-05-09 ENCOUNTER — Telehealth: Payer: Self-pay | Admitting: Physician Assistant

## 2023-05-09 NOTE — Telephone Encounter (Signed)
   Patient Name: Cory Bradley  DOB: May 05, 1933 MRN: [id number]  Primary Cardiologist: Lesleigh Noe, MD (Inactive)  Chart reviewed as part of pre-operative protocol coverage.    Simple dental extractions (i.e. 1-2 teeth), dental cleanings, fillings and crowns are considered low risk procedures per guidelines and generally do not require any specific cardiac clearance. It is also generally accepted that for simple extractions and dental cleanings, there is no need to interrupt blood thinner therapy.   SBE prophylaxis is required for the patient from a cardiac standpoint.  I will route this recommendation to the requesting party via Epic fax function and remove from pre-op pool.  Please call with questions.  Joylene Grapes, NP 05/09/2023, 4:27 PM

## 2023-05-09 NOTE — Telephone Encounter (Signed)
   Pre-operative Risk Assessment    Patient Name: Cory Bradley  DOB: [birth year] MRN: [id number]      Request for Surgical Clearance    Procedure:   Crowns and fillings  Date of Surgery:  Clearance TBD                                 Surgeon:  Dr. Dannielle Burn Surgeon's Group or Practice Name:   Phone number:  [phone]  Fax number:  [phone]   Type of Clearance Requested:   - Medical  - Pharmacy:  Hold        Type of Anesthesia:  Local    Additional requests/questions:   Caller stated there will be no IV or nitrous involved.    Signed, Annetta Maw   05/09/2023, 4:18 PM
